# Patient Record
Sex: Female | Born: 1975 | Race: Black or African American | Hispanic: No | Marital: Single | Smoking: Never smoker
Health system: Southern US, Community
[De-identification: ages and names within clinical notes are randomized; demographics above are authoritative.]

## PROBLEM LIST (undated history)

## (undated) DIAGNOSIS — K8000 Calculus of gallbladder with acute cholecystitis without obstruction: Secondary | ICD-10-CM

## (undated) DIAGNOSIS — I10 Essential (primary) hypertension: Secondary | ICD-10-CM

## (undated) DIAGNOSIS — Z6837 Body mass index (BMI) 37.0-37.9, adult: Secondary | ICD-10-CM

## (undated) DIAGNOSIS — E119 Type 2 diabetes mellitus without complications: Secondary | ICD-10-CM

## (undated) DIAGNOSIS — J45909 Unspecified asthma, uncomplicated: Secondary | ICD-10-CM

## (undated) HISTORY — PX: OTHER SURGICAL HISTORY: SHX169

## (undated) HISTORY — PX: TUBAL LIGATION: SHX77

---
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
## 2011-04-11 ENCOUNTER — Encounter (HOSPITAL_COMMUNITY): Payer: Self-pay

## 2011-04-11 ENCOUNTER — Emergency Department (HOSPITAL_COMMUNITY)
Admission: EM | Admit: 2011-04-11 | Discharge: 2011-04-11 | Disposition: A | Discharge status: Home / Self Care | Payer: Self-pay | Attending: Emergency Medicine | Admitting: Emergency Medicine

## 2011-04-11 DIAGNOSIS — R6884 Jaw pain: Secondary | ICD-10-CM | POA: Insufficient documentation

## 2011-04-11 DIAGNOSIS — J45909 Unspecified asthma, uncomplicated: Secondary | ICD-10-CM | POA: Insufficient documentation

## 2011-04-11 DIAGNOSIS — E119 Type 2 diabetes mellitus without complications: Secondary | ICD-10-CM | POA: Insufficient documentation

## 2011-04-11 DIAGNOSIS — I10 Essential (primary) hypertension: Secondary | ICD-10-CM | POA: Insufficient documentation

## 2011-04-11 DIAGNOSIS — R220 Localized swelling, mass and lump, head: Secondary | ICD-10-CM | POA: Insufficient documentation

## 2011-04-11 DIAGNOSIS — K089 Disorder of teeth and supporting structures, unspecified: Secondary | ICD-10-CM | POA: Insufficient documentation

## 2011-04-11 HISTORY — DX: Essential (primary) hypertension: I10

## 2011-04-11 LAB — POCT I-STAT, CHEM 8
Chloride: 99 mEq/L (ref 96–112)
HCT: 50 % — ABNORMAL HIGH (ref 36.0–46.0)
Hemoglobin: 17 g/dL — ABNORMAL HIGH (ref 12.0–15.0)
Potassium: 3.4 mEq/L — ABNORMAL LOW (ref 3.5–5.1)

## 2011-04-11 LAB — URINALYSIS, ROUTINE W REFLEX MICROSCOPIC
Bilirubin Urine: NEGATIVE
Glucose, UA: >1000 mg/dL — AB
Specific Gravity, Urine: 1.035 — ABNORMAL HIGH (ref 1.005–1.030)
pH: 5.5 (ref 5.0–8.0)

## 2011-04-11 LAB — URINE MICROSCOPIC-ADD ON

## 2011-04-11 MED ORDER — LORATADINE 10 MG PO TABS: 10.0000 mg | ORAL_TABLET | Freq: Every day | ORAL | Status: AC

## 2011-04-11 MED ORDER — LISINOPRIL 20 MG PO TABS: 10.0000 mg | ORAL_TABLET | Freq: Every day | ORAL | Status: DC

## 2011-04-11 MED ORDER — METFORMIN HCL 500 MG PO TABS: 1000.0000 mg | ORAL_TABLET | Freq: Two times a day (BID) | ORAL | Status: DC

## 2011-04-11 MED ORDER — NAPROXEN 500 MG PO TABS: 500.0000 mg | ORAL_TABLET | Freq: Two times a day (BID) | ORAL | Status: DC

## 2011-04-11 MED ORDER — INSULIN ASPART 100 UNIT/ML ~~LOC~~ SOLN
8.0000 [IU] | Freq: Once | SUBCUTANEOUS | Status: AC
  Administered 2011-04-11: 8 [IU] via SUBCUTANEOUS
  Filled 2011-04-11: qty 1

## 2011-04-11 NOTE — ED Provider Notes (Signed)
History     CSN: [id number]  Arrival date & time 04/11/11  1010   First MD Initiated Contact with Patient 04/11/11 1155      Chief Complaint  Patient presents with  . Dental Pain    high blood sugar    (Consider location/radiation/quality/duration/timing/severity/associated sxs/prior treatment) Patient is a 36 y.o. female presenting with tooth pain. The history is provided by the patient. No language interpreter was used.  Dental PainThe primary symptoms include mouth pain. The symptoms began 3 to 5 days ago. The symptoms are worsening. The symptoms occur intermittently.  Additional symptoms include: jaw pain and facial swelling. Additional symptoms do not include: dental sensitivity to temperature, purulent gums, trismus and trouble swallowing.  Elevated blood sugar:    Past Medical History  Diagnosis Date  . Diabetes mellitus   . Hypertension   . Asthma     History reviewed. No pertinent past surgical history.  History reviewed. No pertinent family history.  History  Substance Use Topics  . Smoking status: Never Smoker   . Smokeless tobacco: Not on file  . Alcohol Use: No    OB History    Grav Para Term Preterm Abortions TAB SAB Ect Mult Living                  Review of Systems  HENT: Positive for facial swelling and dental problem. Negative for trouble swallowing.   Genitourinary: Positive for frequency.  All other systems reviewed and are negative.    Allergies  Penicillins  Home Medications   Current Outpatient Rx  Name Route Sig Dispense Refill  . LISINOPRIL 10 MG PO TABS Oral Take 10 mg by mouth daily.    Marland Kitchen LORATADINE 10 MG PO TABS Oral Take 10 mg by mouth daily.    Marland Kitchen METFORMIN HCL 1000 MG PO TABS Oral Take 1,000 mg by mouth 2 (two) times daily with a meal.    . MOMETASONE FUROATE 220 MCG/INH IN AEPB Inhalation Inhale 2 puffs into the lungs 2 (two) times daily.    Marland Kitchen ZOLPIDEM TARTRATE 10 MG PO TABS Oral Take 10 mg by mouth at bedtime as needed.  For sleep      BP 135/92  Pulse 99  Temp(Src) 99.3 F (37.4 C) (Oral)  Resp 16  SpO2 100%  Physical Exam  Nursing note and vitals reviewed. Constitutional: She is oriented to person, place, and time. She appears well-developed and well-nourished.  HENT:  Head: Normocephalic and atraumatic.    Mouth/Throat:    Eyes: Conjunctivae are normal. Pupils are equal, round, and reactive to light.  Neck: Normal range of motion. Neck supple.  Cardiovascular: Normal rate, regular rhythm, normal heart sounds and intact distal pulses.   Pulmonary/Chest: Effort normal and breath sounds normal.  Abdominal: Soft. Bowel sounds are normal.  Musculoskeletal: Normal range of motion.  Neurological: She is alert and oriented to person, place, and time.  Skin: Skin is warm and dry.  Psychiatric: She has a normal mood and affect. Her behavior is normal. Judgment and thought content normal.  No obvious tooth decay noted at site of pain.  Tooth in location has been previously capped.  ED Course  Procedures (including critical care time)  Labs Reviewed  GLUCOSE, CAPILLARY - Abnormal; Notable for the following:    Glucose-Capillary 466 (*)    All other components within normal limits  URINALYSIS, ROUTINE W REFLEX MICROSCOPIC   No results found.   No diagnosis found.  Patient lost medicaid eligibility  on March 1 and has not been able to see a provider for prescription refills. Prescriptions for one month refills of maintenance medications provided.  Information on free generic diabetic medications at [facility] provided.  Patient has been seen by the community care liaison with referral information provided.  Will treat jaw pain with anti-inflammatory.  MDM          Jimmye Norman, NP 04/11/11 2053

## 2011-04-11 NOTE — Progress Notes (Signed)
Paged by Charna Jakyiah Briones for medication assistance and medical resources. Per pharmacy, patient is eligible for the Z[employer] fund. I have contacted Sunny Schlein, community liaison, meet with the patient also. Awaiting scripts to fill.

## 2011-04-11 NOTE — ED Notes (Signed)
Pt complains of out of meds for dm, htn, sleep asthma and allergies since march first., sts also abscess in left side of mouth also.

## 2011-04-11 NOTE — Discharge Instructions (Signed)
Hypertension As your heart beats, it forces blood through your arteries. This force is your blood pressure. If the pressure is too high, it is called hypertension (HTN) or high blood pressure. HTN is dangerous because you may have it and not know it. High blood pressure may mean that your heart has to work harder to pump blood. Your arteries may be narrow or stiff. The extra work puts you at risk for heart disease, stroke, and other problems.  Blood pressure consists of two numbers, a higher number over a lower, 110/72, for example. It is stated as "110 over 72." The ideal is below 120 for the top number (systolic) and under 80 for the bottom (diastolic). Write down your blood pressure today. You should pay close attention to your blood pressure if you have certain conditions such as:  Heart failure.   Prior heart attack.   Diabetes   Chronic kidney disease.   Prior stroke.   Multiple risk factors for heart disease.  To see if you have HTN, your blood pressure should be measured while you are seated with your arm held at the level of the heart. It should be measured at least twice. A one-time elevated blood pressure reading (especially in the Emergency Department) does not mean that you need treatment. There may be conditions in which the blood pressure is different between your right and left arms. It is important to see your caregiver soon for a recheck. Most people have essential hypertension which means that there is not a specific cause. This type of high blood pressure may be lowered by changing lifestyle factors such as:  Stress.   Smoking.   Lack of exercise.   Excessive weight.   Drug/tobacco/alcohol use.   Eating less salt.  Most people do not have symptoms from high blood pressure until it has caused damage to the body. Effective treatment can often prevent, delay or reduce that damage. TREATMENT  When a cause has been identified, treatment for high blood pressure is  directed at the cause. There are a large number of medications to treat HTN. These fall into several categories, and your caregiver will help you select the medicines that are best for you. Medications may have side effects. You should review side effects with your caregiver. If your blood pressure stays high after you have made lifestyle changes or started on medicines,   Your medication(s) may need to be changed.   Other problems may need to be addressed.   Be certain you understand your prescriptions, and know how and when to take your medicine.   Be sure to follow up with your caregiver within the time frame advised (usually within two weeks) to have your blood pressure rechecked and to review your medications.   If you are taking more than one medicine to lower your blood pressure, make sure you know how and at what times they should be taken. Taking two medicines at the same time can result in blood pressure that is too low.  SEEK IMMEDIATE MEDICAL CARE IF:  You develop a severe headache, blurred or changing vision, or confusion.   You have unusual weakness or numbness, or a faint feeling.   You have severe chest or abdominal pain, vomiting, or breathing problems.  MAKE SURE YOU:   Understand these instructions.   Will watch your condition.   Will get help right away if you are not doing well or get worse.  Document Released: 01/10/2005 Document Revised: 12/30/2010 Document Reviewed:   08/31/2007 ExitCare Patient Information 2012 [place], [place].  Type 2 Diabetes Diabetes is a long-lasting (chronic) disease. One or both of the following happen with type 2 diabetes:   The pancreas does not make enough of a hormone called insulin.   The body has trouble using the insulin that is made.  HOME CARE  Check your blood sugar (glucose) once a day, or as told by your doctor.   Take all medicine as told by your doctor.   Do not smoke.   Eat healthy foods. Weight loss can help your  diabetes.   Learn about low blood sugar (hypoglycemia). Know how to treat it.   Get your eyes checked on a regular basis.   Get a physical exam every year. Get your blood pressure checked. Get your blood and pee (urine) tested.   Wear a necklace or bracelet that says you have diabetes.   Check your feet every night for cuts, sores, blisters, and redness. Tell your doctor if you have problems.  GET HELP RIGHT AWAY IF:  You have trouble keeping your blood sugar in target range.   You have problems with your medicines.   You are sick and not getting better after 24 hours.   You have a sore or wound that is not healing.   You have vision problems or changes.   You have a fever.  MAKE SURE YOU:  Understand these instructions.   Will watch your condition.   Will get help right away if you are not doing well or get worse.  Document Released: 10/20/2007 Document Revised: 12/30/2010 Document Reviewed: 06/28/2010 [facility] Patient Information 2012 [place], [place].  RESOURCE GUIDE  Dental Problems  Patients with Medicaid: [facility] [id number]                                           [phone] [address] Phone:  [phone]                                                  Phone:  [phone]  If unable to pay or uninsured, contact:  [facility] or [facility]. to become qualified for the adult dental clinic.  Chronic Pain Problems Contact [facility]  [phone] Patients need to be referred by their primary care doctor.  Insufficient Money for Medicine Contact [employer]:  call "211" or [facility] [phone].  No Primary Care Doctor Call [facility]  [phone] Other agencies that provide inexpensive medical care    [facility]  [phone]    [facility]  [phone]    [facility]  [phone]    [facility]  [phone]    [facility]  [phone]    [facility]  [phone]  Psychological Services [facility]  [phone] [facility] Services  [phone] [facility]   [phone] (emergency services [phone])  Substance Abuse Resources [facility]  [phone] [facility] [phone] [facility] [phone] Floydene Flock [phone] Residential & Outpatient Substance Abuse Program  [phone]  Abuse/Neglect [facility] Child Abuse Hotline (620)182-9325 [facility] Child Abuse Hotline 805-465-6517 (After Hours)  Emergency Shelter [facility] Ministries [phone]  Maternity Homes Room at the [place] [facility] [phone] [facility] [phone]  MRSA Hotline #:   (407) 760-5621    [facility] Resources  [facility]     [employer]                          [facility] Dept. [address] [place]                       [address]      [address]  Blondell Reveal Phone:  [phone]                                   Phone:  [phone]                 Phone:  [phone]  [facility] Mental Health Phone:  [phone]  [facility] Child Abuse Hotline (435) [phone] (After Hours)  Use the information provided to help locate a primary care provider.

## 2011-04-12 NOTE — ED Provider Notes (Signed)
Medical screening examination/treatment/procedure(s) were performed by non-physician practitioner and as supervising physician I was immediately available for consultation/collaboration.  Tayshaun Kroh, MD 04/12/11 1107 

## 2011-05-25 ENCOUNTER — Emergency Department (HOSPITAL_COMMUNITY): Payer: Self-pay

## 2011-05-25 ENCOUNTER — Encounter (HOSPITAL_COMMUNITY): Payer: Self-pay | Admitting: Emergency Medicine

## 2011-05-25 ENCOUNTER — Emergency Department (HOSPITAL_COMMUNITY)
Admission: EM | Admit: 2011-05-25 | Discharge: 2011-05-25 | Disposition: A | Discharge status: Home Health | Payer: Self-pay | Attending: Emergency Medicine | Admitting: Emergency Medicine

## 2011-05-25 DIAGNOSIS — S93401A Sprain of unspecified ligament of right ankle, initial encounter: Secondary | ICD-10-CM

## 2011-05-25 DIAGNOSIS — J45909 Unspecified asthma, uncomplicated: Secondary | ICD-10-CM | POA: Insufficient documentation

## 2011-05-25 DIAGNOSIS — E119 Type 2 diabetes mellitus without complications: Secondary | ICD-10-CM | POA: Insufficient documentation

## 2011-05-25 DIAGNOSIS — W108XXA Fall (on) (from) other stairs and steps, initial encounter: Secondary | ICD-10-CM | POA: Insufficient documentation

## 2011-05-25 DIAGNOSIS — Z79899 Other long term (current) drug therapy: Secondary | ICD-10-CM | POA: Insufficient documentation

## 2011-05-25 DIAGNOSIS — S93409A Sprain of unspecified ligament of unspecified ankle, initial encounter: Secondary | ICD-10-CM | POA: Insufficient documentation

## 2011-05-25 DIAGNOSIS — M25579 Pain in unspecified ankle and joints of unspecified foot: Secondary | ICD-10-CM | POA: Insufficient documentation

## 2011-05-25 DIAGNOSIS — M7989 Other specified soft tissue disorders: Secondary | ICD-10-CM | POA: Insufficient documentation

## 2011-05-25 DIAGNOSIS — W19XXXA Unspecified fall, initial encounter: Secondary | ICD-10-CM | POA: Insufficient documentation

## 2011-05-25 DIAGNOSIS — I10 Essential (primary) hypertension: Secondary | ICD-10-CM | POA: Insufficient documentation

## 2011-05-25 DIAGNOSIS — M79609 Pain in unspecified limb: Secondary | ICD-10-CM | POA: Insufficient documentation

## 2011-05-25 MED ORDER — HYDROCODONE-ACETAMINOPHEN 5-325 MG PO TABS: 1.0000 | ORAL_TABLET | Freq: Four times a day (QID) | ORAL | Status: AC | PRN

## 2011-05-25 NOTE — ED Notes (Signed)
Patient transported to X-ray 

## 2011-05-25 NOTE — ED Provider Notes (Signed)
Medical screening examination/treatment/procedure(s) were performed by non-physician practitioner and as supervising physician I was immediately available for consultation/collaboration.   Celene Kras, MD 05/25/11 [phone]

## 2011-05-25 NOTE — ED Notes (Signed)
Pt co right ankle pain, pt reports walking outside to car and fell down two steps to the ground last night around 10-11 p.m., pt thinks she twisted ankle. Pt states left ankle was much more swollen than it is now but she elevated and put ice on it, pt states she is unable to put weight on left ankle.

## 2011-05-25 NOTE — ED Provider Notes (Signed)
History     CSN: [id number]  Arrival date & time 05/25/11  0830   First MD Initiated Contact with Patient 05/25/11 0838      8:48 AM HPI Patient reports last eye she tripped on first steps and twisted her right ankle. Reports she was unable to come last night she could not find childcare. States during the night she has used ice, elevation, rest and there has been no improvement in pain. Reports waking up this morning having significant swelling in right ankle  Patient is a 36 y.o. female presenting with ankle pain.  Ankle Pain  The incident occurred yesterday. The incident occurred at home. The injury mechanism was a fall and torsion. The pain is present in the right ankle and right foot. The pain is moderate. The pain has been constant since onset. Associated symptoms include inability to bear weight. Pertinent negatives include no numbness, no loss of motion, no muscle weakness, no loss of sensation and no tingling. She reports no foreign bodies present. The symptoms are aggravated by activity, bearing weight and palpation. She has tried NSAIDs, ice, rest and elevation for the symptoms. The treatment provided no relief.    Past Medical History  Diagnosis Date  . Diabetes mellitus   . Hypertension   . Asthma     History reviewed. No pertinent past surgical history.  No family history on file.  History  Substance Use Topics  . Smoking status: Never Smoker   . Smokeless tobacco: Not on file  . Alcohol Use: No    OB History    Grav Para Term Preterm Abortions TAB SAB Ect Mult Living                  Review of Systems  Musculoskeletal: Positive for joint swelling.       Ankle and foot pain  Neurological: Negative for tingling, weakness and numbness.  All other systems reviewed and are negative.    Allergies  Penicillins  Home Medications   Current Outpatient Rx  Name Route Sig Dispense Refill  . LISINOPRIL 10 MG PO TABS Oral Take 10 mg by mouth daily.    Marland Kitchen  LISINOPRIL 20 MG PO TABS Oral Take 0.5 tablets (10 mg total) by mouth daily. 30 tablet 0  . LORATADINE 10 MG PO TABS Oral Take 10 mg by mouth daily.    Marland Kitchen LORATADINE 10 MG PO TABS Oral Take 1 tablet (10 mg total) by mouth daily. 30 tablet 0  . METFORMIN HCL 1000 MG PO TABS Oral Take 1,000 mg by mouth 2 (two) times daily with a meal.    . METFORMIN HCL 500 MG PO TABS Oral Take 2 tablets (1,000 mg total) by mouth 2 (two) times daily with a meal. 60 tablet 0  . MOMETASONE FUROATE 220 MCG/INH IN AEPB Inhalation Inhale 2 puffs into the lungs 2 (two) times daily.    Marland Kitchen NAPROXEN 500 MG PO TABS Oral Take 1 tablet (500 mg total) by mouth 2 (two) times daily. 30 tablet 0  . ZOLPIDEM TARTRATE 10 MG PO TABS Oral Take 10 mg by mouth at bedtime as needed. For sleep      BP 110/73  Pulse 87  Temp(Src) 98.4 F (36.9 C) (Oral)  Resp 18  SpO2 100%  Physical Exam  Vitals reviewed. Constitutional: She is oriented to person, place, and time. Vital signs are normal. She appears well-developed and well-nourished. No distress.  HENT:  Head: Normocephalic and atraumatic.  Eyes: Pupils  are equal, round, and reactive to light.  Neck: Neck supple.  Pulmonary/Chest: Effort normal.  Musculoskeletal:       Right foot: She exhibits decreased range of motion, tenderness, bony tenderness and swelling. She exhibits normal capillary refill, no crepitus, no deformity and no laceration.       Feet:  Neurological: She is alert and oriented to person, place, and time.  Skin: Skin is warm and dry. No rash noted. No erythema. No pallor.  Psychiatric: She has a normal mood and affect. Her behavior is normal.    ED Course  Procedures  Results for orders placed during the hospital encounter of 04/11/11  GLUCOSE, CAPILLARY      Component Value Range   Glucose-Capillary 466 (*) 70 - 99 (mg/dL)  URINALYSIS, ROUTINE W REFLEX MICROSCOPIC      Component Value Range   Color, Urine YELLOW  YELLOW    APPearance CLEAR  CLEAR     Specific Gravity, Urine 1.035 (*) 1.005 - 1.030    pH 5.5  5.0 - 8.0    Glucose, UA >1000 (*) NEGATIVE (mg/dL)   Hgb urine dipstick TRACE (*) NEGATIVE    Bilirubin Urine NEGATIVE  NEGATIVE    Ketones, ur NEGATIVE  NEGATIVE (mg/dL)   Protein, ur NEGATIVE  NEGATIVE (mg/dL)   Urobilinogen, UA 0.2  0.0 - 1.0 (mg/dL)   Nitrite NEGATIVE  NEGATIVE    Leukocytes, UA NEGATIVE  NEGATIVE   URINE MICROSCOPIC-ADD ON      Component Value Range   Squamous Epithelial / LPF FEW (*) RARE    WBC, UA 0-2  <3 (WBC/hpf)   RBC / HPF 0-2  <3 (RBC/hpf)  POCT I-STAT, CHEM 8      Component Value Range   Sodium 137  135 - 145 (mEq/L)   Potassium 3.4 (*) 3.5 - 5.1 (mEq/L)   Chloride 99  96 - 112 (mEq/L)   BUN 4 (*) 6 - 23 (mg/dL)   Creatinine, Ser 1.61  0.50 - 1.10 (mg/dL)   Glucose, Bld 096 (*) 70 - 99 (mg/dL)   Calcium, Ion 0.45  4.09 - 1.32 (mmol/L)   TCO2 25  0 - 100 (mmol/L)   Hemoglobin 17.0 (*) 12.0 - 15.0 (g/dL)   HCT 81.1 (*) 91.4 - 46.0 (%)   Dg Ankle Complete Right  05/25/2011  *RADIOLOGY REPORT*  Clinical Data: Larey Seat down steps, pain and swelling at lateral aspect of the foot and ankle  RIGHT ANKLE - COMPLETE 3+ VIEW  Comparison: None  Findings: Ankle mortise intact. Osseous mineralization grossly normal. Joint spaces preserved. Soft tissue swelling, greatest anteriorly. No acute fracture, dislocation, or bone destruction.  IMPRESSION: No acute osseous abnormalities.  Original Report Authenticated By: Lollie Marrow, M.D.   Dg Foot Complete Right  05/25/2011  *RADIOLOGY REPORT*  Clinical Data: Larey Seat down stairs.  Pain and swelling to lateral foot and ankle.  RIGHT FOOT COMPLETE - 3+ VIEW  Comparison: None.  Findings: There is no evidence for an acute fracture.  Bony alignment is anatomic. No worrisome lytic or sclerotic osseous lesion.  IMPRESSION: No evidence for acute fracture.  No subluxation or dislocation.  Original Report Authenticated By: ERIC A. MANSELL, M.D.     MDM    Patient's x-rays  negative for fracture. Placed in ASO and crutches. Advised likely has a sprain. Advised followup with orthopedic physician if no improvement in one week. Patient provided referral for Dr. Dion Saucier.      Thomasene Lot, PA-C 05/25/11 [phone]

## 2011-05-25 NOTE — Discharge Instructions (Signed)
Ankle Sprain An ankle sprain is an injury to the strong, fibrous tissues (ligaments) that hold the bones of your ankle joint together.  CAUSES Ankle sprain usually is caused by a fall or by twisting your ankle. People who participate in sports are more prone to these types of injuries.  SYMPTOMS  Symptoms of ankle sprain include:  Pain in your ankle. The pain may be present at rest or only when you are trying to stand or walk.   Swelling.   Bruising. Bruising may develop immediately or within 1 to 2 days after your injury.   Difficulty standing or walking.  DIAGNOSIS  Your caregiver will ask you details about your injury and perform a physical exam of your ankle to determine if you have an ankle sprain. During the physical exam, your caregiver will press and squeeze specific areas of your foot and ankle. Your caregiver will try to move your ankle in certain ways. An X-ray exam may be done to be sure a bone was not broken or a ligament did not separate from one of the bones in your ankle (avulsion).  TREATMENT  Certain types of braces can help stabilize your ankle. Your caregiver can make a recommendation for this. Your caregiver may recommend the use of medication for pain. If your sprain is severe, your caregiver may refer you to a surgeon who helps to restore function to parts of your skeletal system (orthopedist) or a physical therapist. HOME CARE INSTRUCTIONS  Apply ice to your injury for 1 to 2 days or as directed by your caregiver. Applying ice helps to reduce inflammation and pain.  Put ice in a plastic bag.   Place a towel between your skin and the bag.   Leave the ice on for 15 to 20 minutes at a time, every 2 hours while you are awake.   Take over-the-counter or prescription medicines for pain, discomfort, or fever only as directed by your caregiver.   Keep your injured leg elevated, when possible, to lessen swelling.   If your caregiver recommends crutches, use them as  instructed. Gradually, put weight on the affected ankle. Continue to use crutches or a cane until you can walk without feeling pain in your ankle.   If you have a plaster splint, wear the splint as directed by your caregiver. Do not rest it on anything harder than a pillow the first 24 hours. Do not put weight on it. Do not get it wet. You may take it off to take a shower or bath.   You may have been given an elastic bandage to wear around your ankle to provide support. If the elastic bandage is too tight (you have numbness or tingling in your foot or your foot becomes cold and blue), adjust the bandage to make it comfortable.   If you have an air splint, you may blow more air into it or let air out to make it more comfortable. You may take your splint off at night and before taking a shower or bath.   Wiggle your toes in the splint several times per day if you are able.  SEEK MEDICAL CARE IF:   You have an increase in bruising, swelling, or pain.   Your toes feel cold.   Pain relief is not achieved with medication.  SEEK IMMEDIATE MEDICAL CARE IF: Your toes are numb or blue or you have severe pain. MAKE SURE YOU:   Understand these instructions.   Will watch your condition.     Will get help right away if you are not doing well or get worse.  Document Released: 01/10/2005 Document Revised: 12/30/2010 Document Reviewed: 08/15/2007 [employer] Patient Information 2012 [employer]. 

## 2011-07-31 ENCOUNTER — Emergency Department (HOSPITAL_COMMUNITY)
Admission: EM | Admit: 2011-07-31 | Discharge: 2011-07-31 | Disposition: A | Discharge status: Home / Self Care | Payer: Self-pay | Attending: Emergency Medicine | Admitting: Emergency Medicine

## 2011-07-31 DIAGNOSIS — I10 Essential (primary) hypertension: Secondary | ICD-10-CM | POA: Insufficient documentation

## 2011-07-31 DIAGNOSIS — K089 Disorder of teeth and supporting structures, unspecified: Secondary | ICD-10-CM | POA: Insufficient documentation

## 2011-07-31 DIAGNOSIS — Z880 Allergy status to penicillin: Secondary | ICD-10-CM | POA: Insufficient documentation

## 2011-07-31 DIAGNOSIS — K0889 Other specified disorders of teeth and supporting structures: Secondary | ICD-10-CM

## 2011-07-31 DIAGNOSIS — J45909 Unspecified asthma, uncomplicated: Secondary | ICD-10-CM | POA: Insufficient documentation

## 2011-07-31 DIAGNOSIS — E119 Type 2 diabetes mellitus without complications: Secondary | ICD-10-CM | POA: Insufficient documentation

## 2011-07-31 MED ORDER — CLINDAMYCIN HCL 150 MG PO CAPS: 450.0000 mg | ORAL_CAPSULE | Freq: Three times a day (TID) | ORAL | Status: AC

## 2011-07-31 MED ORDER — OXYCODONE-ACETAMINOPHEN 5-325 MG PO TABS: 1.0000 | ORAL_TABLET | Freq: Four times a day (QID) | ORAL | Status: AC | PRN

## 2011-07-31 NOTE — ED Notes (Signed)
Pt. With facial swelling on left side which started yesterday. Pt reports gum pain on lower left side and feels it is coming from a tooth in that area.  Rates pain as a 10.

## 2011-07-31 NOTE — ED Provider Notes (Signed)
History     CSN: [id number]  Arrival date & time 07/31/11  1005   First MD Initiated Contact with Patient 07/31/11 1010      Chief Complaint  Patient presents with  . Dental Pain    (Consider location/radiation/quality/duration/timing/severity/associated sxs/prior treatment) HPI Comments: Patient presenting with dental pain of the left lower molar that has been present since yesterday.  She does not have a dentist.  No acute dental injury.  Patient is a 36 y.o. female presenting with tooth pain. The history is provided by the patient.  Dental PainThe primary symptoms include mouth pain. Primary symptoms do not include dental injury, oral bleeding, fever or sore throat. The symptoms began yesterday. The symptoms are worsening. The symptoms are new. The symptoms occur constantly.  Additional symptoms include: gum swelling, gum tenderness and facial swelling. Additional symptoms do not include: purulent gums, trismus, trouble swallowing, pain with swallowing, excessive salivation, drooling and ear pain.    Past Medical History  Diagnosis Date  . Diabetes mellitus   . Hypertension   . Asthma     No past surgical history on file.  No family history on file.  History  Substance Use Topics  . Smoking status: Never Smoker   . Smokeless tobacco: Not on file  . Alcohol Use: No    OB History    Grav Para Term Preterm Abortions TAB SAB Ect Mult Living                  Review of Systems  Constitutional: Negative for fever and chills.  HENT: Positive for facial swelling and dental problem. Negative for ear pain, sore throat, drooling, trouble swallowing, neck pain and neck stiffness.   Gastrointestinal: Negative for nausea and vomiting.  Skin: Negative for color change.    Allergies  Penicillins  Home Medications   Current Outpatient Rx  Name Route Sig Dispense Refill  . METFORMIN HCL 1000 MG PO TABS Oral Take 1,000 mg by mouth 2 (two) times daily with a meal.    .  MOMETASONE FUROATE 220 MCG/INH IN AEPB Inhalation Inhale 2 puffs into the lungs 2 (two) times daily.    Marland Kitchen LISINOPRIL 10 MG PO TABS Oral Take 10 mg by mouth daily.    Marland Kitchen LORATADINE 10 MG PO TABS Oral Take 1 tablet (10 mg total) by mouth daily. 30 tablet 0    BP 124/81  Pulse 64  Temp 98.7 F (37.1 C) (Oral)  Resp 20  SpO2 100%  Physical Exam  Nursing note and vitals reviewed. Constitutional: She is oriented to person, place, and time. She appears well-developed and well-nourished. No distress.  HENT:  Head: Normocephalic and atraumatic. No trismus in the jaw.  Mouth/Throat: Uvula is midline, oropharynx is clear and moist and mucous membranes are normal. Abnormal dentition. No dental abscesses or uvula swelling. No oropharyngeal exudate, posterior oropharyngeal edema, posterior oropharyngeal erythema or tonsillar abscesses.       Poor dental hygiene. Pt able to open and close mouth with out difficulty. Airway intact. Uvula midline. Mild gingival swelling with tenderness over affected area, but no fluctuance. No swelling or tenderness of submental and submandibular regions.  Eyes: Conjunctivae and EOM are normal.  Neck: Normal range of motion and full passive range of motion without pain. Neck supple.  Cardiovascular: Normal rate, regular rhythm and normal heart sounds.   Pulmonary/Chest: Effort normal and breath sounds normal. No stridor. No respiratory distress. She has no wheezes.  Musculoskeletal: Normal range of motion.  Lymphadenopathy:       Head (right side): No submental, no submandibular, no tonsillar, no preauricular and no posterior auricular adenopathy present.       Head (left side): No submental, no submandibular, no tonsillar, no preauricular and no posterior auricular adenopathy present.    She has no cervical adenopathy.  Neurological: She is alert and oriented to person, place, and time.  Skin: Skin is warm and dry. No rash noted. She is not diaphoretic.  Psychiatric: She  has a normal mood and affect.    ED Course  Procedures (including critical care time)  Labs Reviewed - No data to display No results found.   No diagnosis found.    MDM  Patient with toothache.  No gross abscess.  Exam unconcerning for Ludwig's angina or spread of infection.  Will treat with antibiotic and pain medicine.  Urged patient to follow-up with dentist.          Pascal Lux [place], PA-C 07/31/11 2025

## 2011-08-01 NOTE — ED Provider Notes (Signed)
Medical screening examination/treatment/procedure(s) were performed by non-physician practitioner and as supervising physician I was immediately available for consultation/collaboration.  Martha K Linker, MD 08/01/11 1510 

## 2012-04-23 ENCOUNTER — Other Ambulatory Visit (HOSPITAL_COMMUNITY): Payer: Self-pay | Admitting: Nurse Practitioner

## 2012-04-23 DIAGNOSIS — R109 Unspecified abdominal pain: Secondary | ICD-10-CM

## 2012-04-24 ENCOUNTER — Observation Stay (HOSPITAL_COMMUNITY)
Admission: AD | Admit: 2012-04-24 | Discharge: 2012-04-26 | Disposition: A | Discharge status: Home / Self Care | Payer: Medicaid Other | Source: Ambulatory Visit | Attending: General Surgery | Admitting: General Surgery

## 2012-04-24 ENCOUNTER — Encounter (HOSPITAL_COMMUNITY): Payer: Self-pay

## 2012-04-24 VITALS — BP 99/63 | HR 68 | Temp 98.6°F | Resp 18 | Ht 65.0 in | Wt 226.2 lb

## 2012-04-24 DIAGNOSIS — R109 Unspecified abdominal pain: Secondary | ICD-10-CM

## 2012-04-24 DIAGNOSIS — E119 Type 2 diabetes mellitus without complications: Secondary | ICD-10-CM | POA: Insufficient documentation

## 2012-04-24 DIAGNOSIS — K8010 Calculus of gallbladder with chronic cholecystitis without obstruction: Secondary | ICD-10-CM

## 2012-04-24 DIAGNOSIS — J45909 Unspecified asthma, uncomplicated: Secondary | ICD-10-CM | POA: Insufficient documentation

## 2012-04-24 DIAGNOSIS — K8000 Calculus of gallbladder with acute cholecystitis without obstruction: Principal | ICD-10-CM | POA: Insufficient documentation

## 2012-04-24 DIAGNOSIS — K7689 Other specified diseases of liver: Secondary | ICD-10-CM | POA: Insufficient documentation

## 2012-04-24 DIAGNOSIS — Z6837 Body mass index (BMI) 37.0-37.9, adult: Secondary | ICD-10-CM

## 2012-04-24 DIAGNOSIS — I10 Essential (primary) hypertension: Secondary | ICD-10-CM | POA: Insufficient documentation

## 2012-04-24 HISTORY — DX: Type 2 diabetes mellitus without complications: E11.9

## 2012-04-24 HISTORY — DX: Essential (primary) hypertension: I10

## 2012-04-24 HISTORY — DX: Unspecified asthma, uncomplicated: J45.909

## 2012-04-24 HISTORY — DX: Body mass index (bmi) 37.0-37.9, adult: Z68.37

## 2012-04-24 HISTORY — DX: Calculus of gallbladder with acute cholecystitis without obstruction: K80.00

## 2012-04-24 LAB — URINALYSIS, ROUTINE W REFLEX MICROSCOPIC
Leukocytes, UA: NEGATIVE
Nitrite: NEGATIVE
Protein, ur: NEGATIVE mg/dL
Specific Gravity, Urine: 1.024 (ref 1.005–1.030)
Urobilinogen, UA: 0.2 mg/dL (ref 0.0–1.0)

## 2012-04-24 LAB — BASIC METABOLIC PANEL
Calcium: 9 mg/dL (ref 8.4–10.5)
GFR calc Af Amer: >90 mL/min (ref >90)
GFR calc non Af Amer: >90 mL/min (ref >90)
Glucose, Bld: 132 mg/dL — ABNORMAL HIGH (ref 70–99)
Potassium: 3.8 mEq/L (ref 3.5–5.1)
Sodium: 135 mEq/L (ref 135–145)

## 2012-04-24 LAB — URINE MICROSCOPIC-ADD ON

## 2012-04-24 LAB — SURGICAL PCR SCREEN
MRSA, PCR: NEGATIVE
Staphylococcus aureus: NEGATIVE

## 2012-04-24 LAB — MAGNESIUM: Magnesium: 1.6 mg/dL (ref 1.5–2.5)

## 2012-04-24 MED ORDER — POTASSIUM CHLORIDE IN NACL 20-0.9 MEQ/L-% IV SOLN
INTRAVENOUS | Status: AC
  Administered 2012-04-24: 16:00:00 via INTRAVENOUS
  Filled 2012-04-24: qty 1000

## 2012-04-24 MED ORDER — ACETAMINOPHEN 650 MG RE SUPP: 650.0000 mg | Freq: Four times a day (QID) | RECTAL | Status: DC | PRN

## 2012-04-24 MED ORDER — ONDANSETRON HCL 4 MG/2ML IJ SOLN
4.0000 mg | Freq: Four times a day (QID) | INTRAMUSCULAR | Status: DC | PRN
  Administered 2012-04-24: 4 mg via INTRAVENOUS
  Filled 2012-04-24: qty 2

## 2012-04-24 MED ORDER — OXYCODONE HCL 5 MG PO TABS: 5.0000 mg | ORAL_TABLET | ORAL | Status: DC | PRN

## 2012-04-24 MED ORDER — POTASSIUM CHLORIDE IN NACL 20-0.9 MEQ/L-% IV SOLN
INTRAVENOUS | Status: DC
  Administered 2012-04-25 (×2): via INTRAVENOUS
  Filled 2012-04-24 (×3): qty 1000

## 2012-04-24 MED ORDER — ACETAMINOPHEN 325 MG PO TABS: 650.0000 mg | ORAL_TABLET | Freq: Four times a day (QID) | ORAL | Status: DC | PRN

## 2012-04-24 MED ORDER — HEPARIN SODIUM (PORCINE) 5000 UNIT/ML IJ SOLN
5000.0000 [IU] | Freq: Three times a day (TID) | INTRAMUSCULAR | Status: AC
  Administered 2012-04-24 (×2): 5000 [IU] via SUBCUTANEOUS
  Filled 2012-04-24 (×2): qty 1

## 2012-04-24 MED ORDER — CIPROFLOXACIN IN D5W 400 MG/200ML IV SOLN
400.0000 mg | Freq: Two times a day (BID) | INTRAVENOUS | Status: DC
  Administered 2012-04-24 – 2012-04-26 (×4): 400 mg via INTRAVENOUS
  Filled 2012-04-24 (×5): qty 200

## 2012-04-24 MED ORDER — HYDROMORPHONE HCL PF 1 MG/ML IJ SOLN
0.5000 mg | INTRAMUSCULAR | Status: DC | PRN
  Administered 2012-04-24 (×2): 0.5 mg via INTRAVENOUS
  Administered 2012-04-25: 1 mg via INTRAVENOUS
  Administered 2012-04-25: 0.5 mg via INTRAVENOUS
  Filled 2012-04-24 (×4): qty 1

## 2012-04-24 NOTE — Anesthesia Preprocedure Evaluation (Addendum)
Anesthesia Evaluation  Patient identified by MRN, date of birth, ID band Patient awake    Reviewed: Allergy & Precautions, H&P , NPO status , Patient's Chart, lab work & pertinent test results, reviewed documented beta blocker date and time   Airway Mallampati: II TM Distance: >3 FB Neck ROM: full    Dental no notable dental hx. (+) Teeth Intact and Dental Advisory Given   Pulmonary asthma ,  breath sounds clear to auscultation  Pulmonary exam normal       Cardiovascular Exercise Tolerance: Good hypertension, Pt. on medications Rhythm:regular Rate:Normal     Neuro/Psych negative neurological ROS  negative psych ROS   GI/Hepatic negative GI ROS, Neg liver ROS,   Endo/Other  diabetes, Well Controlled, Type 2, Oral Hypoglycemic Agents  Renal/GU negative Renal ROS  negative genitourinary   Musculoskeletal   Abdominal   Peds  Hematology negative hematology ROS (+)   Anesthesia Other Findings   Reproductive/Obstetrics negative OB ROS                           Anesthesia Physical Anesthesia Plan  ASA: III  Anesthesia Plan: General   Post-op Pain Management:    Induction: Intravenous  Airway Management Planned: Oral ETT  Additional Equipment:   Intra-op Plan:   Post-operative Plan: Extubation in OR  Informed Consent: I have reviewed the patients History and Physical, chart, labs and discussed the procedure including the risks, benefits and alternatives for the proposed anesthesia with the patient or authorized representative who has indicated his/her understanding and acceptance.   Dental Advisory Given  Plan Discussed with: CRNA and Surgeon  Anesthesia Plan Comments:         Anesthesia Quick Evaluation

## 2012-04-24 NOTE — H&P (Signed)
April Byrd is an 37 y.o. female.   PCP Dr. Clarice Pole NP Chief Complaint: Nausea, abdominal pain and back pain.  HPI: Patient is a 37 year old female presented to Dr. Jone Baseman office with epigastric pain started on 04/20/12. She developed abdominal pain 04/21/12, which also went to her back became progressively worse over the next 3 days. She presented to Dr. Valentina Lucks yesterday with abdominal pain, mid abdomen going toward the back.   She has no prior history of this, type of pain; she's been able to eat and drink some with this. She would have nausea but no vomiting until this a.m. She vomited after eating hash browns. Labs yesterday showed a white count of 6.8, hemoglobin of 13, hematocrit of 40.8, platelets were 229,000. Urinalysis was positive for blood LFTs were normal. She underwent abdominal ultrasound this morning which showed gallstones measuring up to 2.8 cm and 1.8 cm lodged in the neck of the gallbladder on ultrasound this AM. Gallbladder wall thickening measuring up to 3.6 mm. Patient was then  reported to be non- tender pm Ultrasound exam. Common bile duct was 4.2 mm liver was heterogeneous with a suggestion of fatty infiltration. She was referred to Dr. Johna Sheriff by Dr. Valentina Lucks. By mutual agreement she was transferred to [facility] for elective admission. Past Medical History  Diagnosis Date  . Diabetes mellitus   . Hypertension   . Asthma    Prior to Admission medications   Medication Sig Start Date End Date Taking? Authorizing Provider  glimepiride (AMARYL) 2 MG tablet Take 2 mg by mouth daily before breakfast.   Yes Historical Provider, MD  metFORMIN (GLUCOPHAGE) 1000 MG tablet Take 1,000 mg by mouth 2 (two) times daily with a meal.   Yes Historical Provider, MD  traMADol-acetaminophen (ULTRACET) 37.5-325 MG per tablet Take 1-2 tablets by mouth every 6 (six) hours as needed for pain.   Yes Historical Provider, MD  mometasone (ASMANEX 14 METERED DOSES) 220  MCG/INH inhaler Inhale 2 puffs into the lungs 2 (two) times daily.    Historical Provider, MD     No past surgical history on file.3 Csections Hx of hydrocephalus shunt, removed at age 62 BLT 61  Family history: Father cc of liver disease and diagnosis with coronary disease. Mother is living with diabetes and hypertension one brother deceased. 3 children ages 28,11, and 7.  Social History:  She is single she has 3 children she works patient care for disabled individuals EtOH: rare. Tobacco: None.  Drugs: None   Allergies: Penicillin  This occurred during her childhood she does not know what the reaction was.  Allergies  Allergen Reactions  . Penicillins Other (See Comments)    unknown    Medications Prior to Admission  Medication Sig Dispense Refill  . lisinopril (PRINIVIL,ZESTRIL) 10 MG tablet Take 10 mg by mouth daily.      . metFORMIN (GLUCOPHAGE) 1000 MG tablet Take 1,000 mg by mouth 2 (two) times daily with a meal.      . mometasone (ASMANEX 14 METERED DOSES) 220 MCG/INH inhaler Inhale 2 puffs into the lungs 2 (two) times daily.        No results found for this or any previous visit (from the past 48 hour(s)). US Abdomen Complete  04/24/2012  *RADIOLOGY REPORT*  Clinical Data:  Diabetic hypertensive patient with abdominal pain.  COMPLETE ABDOMINAL ULTRASOUND  Comparison:  None.  Findings:  Gallbladder:  Gallstones measuring gallstone measuring up to 2.8 cm.  1.8 cm gallstone lodged  in the gallbladder neck.  Gallbladder wall thickening measuring 3.6 mm.  As per ultrasound technologist, the patient was not tender over this region during scanning.  Common bile duct:  4.2 mm.  Distal aspect not well delineated secondary to bowel gas.  Liver:  Heterogeneous increased echogenicity suggestive of fatty infiltration without dominant mass.   Right lobe appears elongated.  IVC:  Appears normal.  Pancreas:  Pancreatic tail not visualized secondary to bowel gas. Otherwise unremarkable  Spleen:   6.5 cm without dominant mass  Right Kidney:  11.3 cm. No hydronephrosis or renal mass.  Left Kidney:  11.5 cm. No hydronephrosis or renal mass.  Abdominal aorta:  No aneurysm identified.  IMPRESSION: Gallstones measuring gallstone measuring up to 2.8 cm.  1.8 cm gallstone lodged in the gallbladder neck.  Gallbladder wall thickening measuring 3.6 mm.  As per ultrasound technologist, the patient was not tender over this region during scanning.  Clinical and laboratory correlation recommended.  Fatty liver.  Pancreatic tail not well delineated secondary to overlying bowel gas.  This is a call report.   Original Report Authenticated By: Lacy Duverney, M.D.     Review of Systems  Constitutional: Negative.   HENT: Negative.   Eyes: Negative.   Respiratory: Positive for shortness of breath (some doe).   Cardiovascular: Negative.   Gastrointestinal: Positive for nausea, vomiting (this am after hashbrowns.) and abdominal pain. Negative for heartburn, diarrhea, constipation, blood in stool and melena.  Genitourinary: Negative.   Musculoskeletal: Negative.   Skin: Negative.   Neurological: Negative.   Endo/Heme/Allergies: Negative.   Psychiatric/Behavioral: Negative.     Blood pressure 110/73, pulse 63, temperature 99.1 F (37.3 C), temperature source Oral, resp. rate 16, SpO2 100.00%. Physical Exam  Constitutional: She is oriented to person, place, and time. She appears well-developed and well-nourished.  64 inch  228 lb BMI 39  HENT:  Head: Normocephalic and atraumatic.  Nose: Nose normal.  Eyes: Conjunctivae and EOM are normal. Pupils are equal, round, and reactive to light. Right eye exhibits no discharge. No scleral icterus.  Neck: Normal range of motion. Neck supple. No JVD present. No tracheal deviation present. No thyromegaly present.  Cardiovascular: Normal rate, regular rhythm, normal heart sounds and intact distal pulses.  Exam reveals no gallop.   No murmur heard. Respiratory: Effort  normal and breath sounds normal. No respiratory distress. She has no wheezes. She has no rales. She exhibits no tenderness.  GI: Soft. Bowel sounds are normal. She exhibits no distension and no mass. There is tenderness (RUQ primary site and most tender, some RLQ and midepigastric). There is no rebound and no guarding.  Musculoskeletal: Normal range of motion. She exhibits no edema.  Lymphadenopathy:    She has no cervical adenopathy.  Neurological: She is alert and oriented to person, place, and time. No cranial nerve deficit.  Skin: Skin is warm and dry. No rash noted. No erythema.  Psychiatric: She has a normal mood and affect. Her behavior is normal. Judgment and thought content normal.     Assessment/Plan 1. Cholelithiasis with cholecystitis. 2. Type 2 diabetes non-insulin-dependent, oral meds. 3. Athletic asthma no problems currently 4. Obesity with a BMI of 39.  Plan: CBC and LFTs were normal yesterday, check a BMP today. Start clears and antibiotics today and tentatively schedule for surgery the a.m. her last by mouth intake was this morning with postprandial nausea and some vomiting. [facility] physician assistant for Dr. Glenna Fellows.  Erza Mothershead 04/24/2012, 2:15 PM

## 2012-04-24 NOTE — Progress Notes (Signed)
Nutrition Education Note  RD education requested for nutrition education regarding diabetes, weight management, and low fat diet for cholecystectomy.   No results found for this basename: HGBA1C    RD provided "Carbohydrate Counting for People with Diabetes" handout from the Academy of [employer] and [employer]etics. Discussed different food groups and their effects on blood sugar, emphasizing carbohydrate-containing foods. Provided list of carbohydrates and recommended serving sizes of common foods.  Discussed importance of controlled and consistent carbohydrate intake throughout the day. Provided examples of ways to balance meals/snacks and encouraged intake of high-fiber, whole grain complex carbohydrates. Teach back method used.  RD provided "Weight management cooking tips" handout from the Academy of [employer] and [employer]etics. Discussed importance of portion control, avoiding sugary beverages, and choosing nutritious foods.   RD also briefly discussed low fat diet for cholecystectomy. Discussed sources of fat in the diet and recommended alternatives.   Pt states biggest barrier is cooking for the family and feeling rushed and busy. Pt admitted to eating out frequently, snacking often, and drinking sweet tea. Discussed healthier meals/snacks on the go as well as better beverage choices.   Expect fair compliance. Pt interested in outpatient RD DM follow up, will order.   Body mass index is 37.64 kg/(m^2). Pt meets criteria for class II obesity based on current BMI.  Pt NPO for surgery tomorrow. Labs and medications reviewed. No further nutrition interventions warranted at this time. RD contact information provided. If additional nutrition issues arise, please re-consult RD.   Levon Hedger MS, RD, LDN [phone] Pager [phone] After Hours Pager

## 2012-04-24 NOTE — H&P (Signed)
Patient interviewed and examined, Imaging and lab reviewed, agree with PA note above. She has persistent discomfort and thickening of the gallbladder wall consistent with mild acute cholecystitis. She has eaten today and we will plan laparoscopic cholecystectomy with cholangiogram tomorrow morning.I discussed the procedure in detail.   We discussed the risks and benefits of a laparoscopic cholecystectomy and possible cholangiogram including, but not limited to bleeding, infection, injury to surrounding structures such as the intestine or liver, bile leak, retained gallstones, need to convert to an open procedure, prolonged diarrhea, blood clots such as  DVT, common bile duct injury, anesthesia risks, and possible need for additional procedures.  The likelihood of improvement in symptoms and return to the patient's normal status is good. We discussed the typical post-operative recovery course.  Mariella Saa MD, FACS  04/24/2012 3:39 PM

## 2012-04-25 ENCOUNTER — Encounter (HOSPITAL_COMMUNITY): Admission: AD | Disposition: A | Discharge status: Home / Self Care | Payer: Self-pay | Source: Ambulatory Visit

## 2012-04-25 ENCOUNTER — Encounter (HOSPITAL_COMMUNITY): Payer: Self-pay | Admitting: Anesthesiology

## 2012-04-25 ENCOUNTER — Observation Stay (HOSPITAL_COMMUNITY): Payer: Medicaid Other | Admitting: Anesthesiology

## 2012-04-25 ENCOUNTER — Observation Stay (HOSPITAL_COMMUNITY): Payer: Medicaid Other

## 2012-04-25 HISTORY — PX: CHOLECYSTECTOMY: SHX55

## 2012-04-25 LAB — GLUCOSE, CAPILLARY: Glucose-Capillary: 140 mg/dL — ABNORMAL HIGH (ref 70–99)

## 2012-04-25 LAB — CBC
HCT: 34.2 % — ABNORMAL LOW (ref 36.0–46.0)
Platelets: 191 10*3/uL (ref 150–400)
RBC: 4.34 MIL/uL (ref 3.87–5.11)
RDW: 14.4 % (ref 11.5–15.5)
WBC: 5 10*3/uL (ref 4.0–10.5)

## 2012-04-25 LAB — COMPREHENSIVE METABOLIC PANEL
Alkaline Phosphatase: 74 U/L (ref 39–117)
BUN: 7 mg/dL (ref 6–23)
Creatinine, Ser: 0.82 mg/dL (ref 0.50–1.10)
GFR calc Af Amer: >90 mL/min (ref >90)
Glucose, Bld: 147 mg/dL — ABNORMAL HIGH (ref 70–99)
Potassium: 3.6 mEq/L (ref 3.5–5.1)
Total Bilirubin: 0.9 mg/dL (ref 0.3–1.2)
Total Protein: 6.6 g/dL (ref 6.0–8.3)

## 2012-04-25 SURGERY — LAPAROSCOPIC CHOLECYSTECTOMY WITH INTRAOPERATIVE CHOLANGIOGRAM
Anesthesia: General | Wound class: Contaminated

## 2012-04-25 MED ORDER — HEPARIN SODIUM (PORCINE) 5000 UNIT/ML IJ SOLN
5000.0000 [IU] | Freq: Three times a day (TID) | INTRAMUSCULAR | Status: DC
  Administered 2012-04-26: 5000 [IU] via SUBCUTANEOUS
  Filled 2012-04-25 (×4): qty 1

## 2012-04-25 MED ORDER — INSULIN ASPART 100 UNIT/ML ~~LOC~~ SOLN
0.0000 [IU] | Freq: Three times a day (TID) | SUBCUTANEOUS | Status: DC
  Administered 2012-04-26: 2 [IU] via SUBCUTANEOUS
  Administered 2012-04-26: 3 [IU] via SUBCUTANEOUS

## 2012-04-25 MED ORDER — PROPOFOL 10 MG/ML IV EMUL
INTRAVENOUS | Status: DC | PRN
  Administered 2012-04-25: 200 mg via INTRAVENOUS

## 2012-04-25 MED ORDER — ONDANSETRON HCL 4 MG/2ML IJ SOLN
INTRAMUSCULAR | Status: DC | PRN
  Administered 2012-04-25 (×2): 2 mg via INTRAVENOUS

## 2012-04-25 MED ORDER — ACETAMINOPHEN 10 MG/ML IV SOLN
INTRAVENOUS | Status: DC | PRN
  Administered 2012-04-25: 1000 mg via INTRAVENOUS

## 2012-04-25 MED ORDER — POTASSIUM CHLORIDE IN NACL 20-0.9 MEQ/L-% IV SOLN
INTRAVENOUS | Status: DC
  Administered 2012-04-25 – 2012-04-26 (×2): via INTRAVENOUS
  Filled 2012-04-25 (×3): qty 1000

## 2012-04-25 MED ORDER — NEOSTIGMINE METHYLSULFATE 1 MG/ML IJ SOLN
INTRAMUSCULAR | Status: DC | PRN
  Administered 2012-04-25: 3 mg via INTRAVENOUS

## 2012-04-25 MED ORDER — FENTANYL CITRATE 0.05 MG/ML IJ SOLN: 25.0000 ug | INTRAMUSCULAR | Status: DC | PRN

## 2012-04-25 MED ORDER — LACTATED RINGERS IV SOLN: INTRAVENOUS | Status: DC

## 2012-04-25 MED ORDER — LACTATED RINGERS IR SOLN
Status: DC | PRN
  Administered 2012-04-25: 1

## 2012-04-25 MED ORDER — LACTATED RINGERS IV SOLN
INTRAVENOUS | Status: DC | PRN
  Administered 2012-04-25 (×2): via INTRAVENOUS

## 2012-04-25 MED ORDER — SODIUM CHLORIDE 0.9 % IJ SOLN
INTRAMUSCULAR | Status: DC | PRN
  Administered 2012-04-25: 10:00:00

## 2012-04-25 MED ORDER — MIDAZOLAM HCL 5 MG/5ML IJ SOLN
INTRAMUSCULAR | Status: DC | PRN
  Administered 2012-04-25 (×2): 0.5 mg via INTRAVENOUS
  Administered 2012-04-25: 1 mg via INTRAVENOUS

## 2012-04-25 MED ORDER — FLUTICASONE PROPIONATE HFA 44 MCG/ACT IN AERO
2.0000 | INHALATION_SPRAY | Freq: Two times a day (BID) | RESPIRATORY_TRACT | Status: DC
  Administered 2012-04-25 – 2012-04-26 (×2): 2 via RESPIRATORY_TRACT
  Filled 2012-04-25: qty 10.6

## 2012-04-25 MED ORDER — OXYCODONE-ACETAMINOPHEN 5-325 MG PO TABS
1.0000 | ORAL_TABLET | ORAL | Status: DC | PRN
  Administered 2012-04-25 – 2012-04-26 (×4): 2 via ORAL
  Filled 2012-04-25 (×4): qty 2

## 2012-04-25 MED ORDER — LIDOCAINE HCL (CARDIAC) 20 MG/ML IV SOLN
INTRAVENOUS | Status: DC | PRN
  Administered 2012-04-25: 30 mg via INTRAVENOUS

## 2012-04-25 MED ORDER — GLYCOPYRROLATE 0.2 MG/ML IJ SOLN
INTRAMUSCULAR | Status: DC | PRN
  Administered 2012-04-25: 0.4 mg via INTRAVENOUS

## 2012-04-25 MED ORDER — SUCCINYLCHOLINE CHLORIDE 20 MG/ML IJ SOLN
INTRAMUSCULAR | Status: DC | PRN
  Administered 2012-04-25: 150 mg via INTRAVENOUS

## 2012-04-25 MED ORDER — 0.9 % SODIUM CHLORIDE (POUR BTL) OPTIME
TOPICAL | Status: DC | PRN
  Administered 2012-04-25: 1000 mL

## 2012-04-25 MED ORDER — CISATRACURIUM BESYLATE (PF) 10 MG/5ML IV SOLN
INTRAVENOUS | Status: DC | PRN
  Administered 2012-04-25: 10 mg via INTRAVENOUS

## 2012-04-25 MED ORDER — HYDROMORPHONE HCL PF 1 MG/ML IJ SOLN
INTRAMUSCULAR | Status: DC | PRN
  Administered 2012-04-25 (×4): 0.5 mg via INTRAVENOUS

## 2012-04-25 MED ORDER — BUPIVACAINE-EPINEPHRINE 0.25% -1:200000 IJ SOLN
INTRAMUSCULAR | Status: DC | PRN
  Administered 2012-04-25: 24 mL

## 2012-04-25 MED ORDER — FENTANYL CITRATE 0.05 MG/ML IJ SOLN
INTRAMUSCULAR | Status: DC | PRN
  Administered 2012-04-25 (×2): 25 ug via INTRAVENOUS
  Administered 2012-04-25 (×3): 50 ug via INTRAVENOUS
  Administered 2012-04-25: 150 ug via INTRAVENOUS

## 2012-04-25 SURGICAL SUPPLY — 41 items
APPLIER CLIP ROT 10 11.4 M/L (STAPLE) ×2
BENZOIN TINCTURE PRP APPL 2/3 (GAUZE/BANDAGES/DRESSINGS) IMPLANT
CANISTER SUCTION 2500CC (MISCELLANEOUS) ×2 IMPLANT
CATH REDDICK CHOLANGI 4FR 50CM (CATHETERS) IMPLANT
CHLORAPREP W/TINT 26ML (MISCELLANEOUS) ×2 IMPLANT
CLIP APPLIE ROT 10 11.4 M/L (STAPLE) ×1 IMPLANT
CLOTH BEACON ORANGE TIMEOUT ST (SAFETY) ×2 IMPLANT
COVER MAYO STAND STRL (DRAPES) ×2 IMPLANT
DECANTER SPIKE VIAL GLASS SM (MISCELLANEOUS) ×2 IMPLANT
DERMABOND ADVANCED (GAUZE/BANDAGES/DRESSINGS) ×1
DERMABOND ADVANCED .7 DNX12 (GAUZE/BANDAGES/DRESSINGS) ×1 IMPLANT
DRAPE C-ARM 42X72 X-RAY (DRAPES) ×2 IMPLANT
DRAPE LAPAROSCOPIC ABDOMINAL (DRAPES) ×2 IMPLANT
DRAPE UTILITY 15X26 (DRAPE) ×2 IMPLANT
ELECT REM PT RETURN 9FT ADLT (ELECTROSURGICAL) ×2
ELECTRODE REM PT RTRN 9FT ADLT (ELECTROSURGICAL) ×1 IMPLANT
GLOVE BIOGEL PI IND STRL 7.0 (GLOVE) ×1 IMPLANT
GLOVE BIOGEL PI INDICATOR 7.0 (GLOVE) ×1
GLOVE SS BIOGEL STRL SZ 7.5 (GLOVE) ×1 IMPLANT
GLOVE SUPERSENSE BIOGEL SZ 7.5 (GLOVE) ×1
GOWN STRL NON-REIN LRG LVL3 (GOWN DISPOSABLE) IMPLANT
GOWN STRL REIN XL XLG (GOWN DISPOSABLE) ×6 IMPLANT
HEMOSTAT SURGICEL 4X8 (HEMOSTASIS) IMPLANT
IV CATH 14GX2 1/4 (CATHETERS) IMPLANT
IV SET EXT 30 76VOL 4 MALE LL (IV SETS) IMPLANT
KIT BASIN OR (CUSTOM PROCEDURE TRAY) ×2 IMPLANT
NS IRRIG 1000ML POUR BTL (IV SOLUTION) IMPLANT
POUCH SPECIMEN RETRIEVAL 10MM (ENDOMECHANICALS) ×2 IMPLANT
SET CHOLANGIOGRAPH MIX (MISCELLANEOUS) ×2 IMPLANT
SET IRRIG TUBING LAPAROSCOPIC (IRRIGATION / IRRIGATOR) ×2 IMPLANT
SOLUTION ANTI FOG 6CC (MISCELLANEOUS) ×2 IMPLANT
STRIP CLOSURE SKIN 1/2X4 (GAUZE/BANDAGES/DRESSINGS) IMPLANT
SUT MNCRL AB 4-0 PS2 18 (SUTURE) ×2 IMPLANT
TOWEL OR 17X26 10 PK STRL BLUE (TOWEL DISPOSABLE) ×4 IMPLANT
TRAY LAP CHOLE (CUSTOM PROCEDURE TRAY) ×2 IMPLANT
TROCAR SLEEVE XCEL 5X75 (ENDOMECHANICALS) ×2 IMPLANT
TROCAR XCEL BLADELESS 5X75MML (TROCAR) ×2 IMPLANT
TROCAR XCEL BLUNT TIP 100MML (ENDOMECHANICALS) ×2 IMPLANT
TROCAR XCEL NON-BLD 11X100MML (ENDOMECHANICALS) ×2 IMPLANT
TROCAR Z-THREAD FIOS 5X100MM (TROCAR) IMPLANT
TUBING INSUFFLATION 10FT LAP (TUBING) ×2 IMPLANT

## 2012-04-25 NOTE — Care Management Note (Signed)
    Page 1 of 1   04/25/2012     2:56:17 PM   CARE MANAGEMENT NOTE 04/25/2012  Patient:  April Byrd, April Byrd   Account Number:  [id number]  Date Initiated:  04/25/2012  Documentation initiated by:  Lorenda Ishihara  Subjective/Objective Assessment:   37 yo female admitted s/p lap chole. PTA lived at home.     Action/Plan:   Home when stable   Anticipated DC Date:  04/26/2012   Anticipated DC Plan:  HOME/SELF CARE      DC Planning Services  CM consult      Choice offered to / List presented to:             Status of service:  Completed, signed off Medicare Important Message given?   (If response is "NO", the following Medicare IM given date fields will be blank) Date Medicare IM given:   Date Additional Medicare IM given:    Discharge Disposition:  HOME/SELF CARE  Per UR Regulation:  Reviewed for med. necessity/level of care/duration of stay  If discussed at Long Length of Stay Meetings, dates discussed:    Comments:

## 2012-04-25 NOTE — Progress Notes (Signed)
Respiratory Therapy in to administer flovent. Patient was c/o blood sugar being too low and that she was hot. Refusing flovent at this time. RT observed patient taking a medication out of her purse from what appeared to be a prescription bottle. In formed RN of observation and told her we would be back to see if patient wanted flovent

## 2012-04-25 NOTE — Op Note (Signed)
Preoperative diagnosis: Cholelithiasis and cholecystitis  Postoperative diagnosis: Cholelithiasis and cholecystitis  Surgical procedure: Laparoscopic cholecystectomy with intraoperative cholangiogram  Surgeon: Sharlet Salina T. Calvina Liptak M.D.  Assistant: None  Anesthesia: General Endotracheal  Complications: None  Estimated blood loss: Minimal  Description of procedure: The patient brought to the operating room, placed in the supine position on the operating table, and general endotracheal anesthesia induced. The abdomen was widely sterilely prepped and draped. The patient had received preoperative IV antibiotics and PAS were in place. Patient timeout was performed the correct procedure verified. Standard 4 port technique was used with an open Hassan cannula at the umbilicus and the remainder of the ports placed under direct vision. The gallbladder was visualized. It appeared acutely inflamed, tense and edematous.  The GB was initially decompressed with an aspiration needle. The fundus was grasped and elevated up over the liver and the infundibulum retracted inferiolaterally. Peritoneum anterior and posterior to close triangle was incised and fibrofatty tissue stripped off the neck of the gallbladder toward the porta hepatis. The distal gallbladder was thoroughly dissected. The cystic artery was identified in close triangle and the cystic duct gallbladder junction dissected 360.  A good critical view was obtained. When the anatomy was clear the cystic duct was clipped at the gallbladder junction and an operative cholangiogram obtained through the cystic duct. This showed good filling of a normal common bile duct and intrahepatic ducts with free flow into the duodenum and no filling defects. Following this the Cholangiocath was removed and the cystic duct was doubly clipped proximally and divided. The cystic artery was doubly clipped proximally and distally and divided. The gallbladder was dissected free  from its bed using hook cautery and removed through the umbilical port site. Complete hemostasis was obtained in the gallbladder bed. The right upper quadrant was thoroughly irrigated and hemostasis assured. Trochars were removed and all CO2 evacuated and the [facility] trocar site fascial defect closed. Skin incisions were closed with subcuticular Monocryl and Dermabond. Sponge needle and instrument counts were correct. The patient was taken to PACU in good condition.  Demetri Kerman T  04/25/2012

## 2012-04-25 NOTE — Transfer of Care (Signed)
Immediate Anesthesia Transfer of Care Note  Patient: April Byrd  Procedure(s) Performed: Procedure(s): LAPAROSCOPIC CHOLECYSTECTOMY WITH INTRAOPERATIVE CHOLANGIOGRAM (N/A)  Patient Location: PACU  Anesthesia Type:General  Level of Consciousness: awake, alert , oriented and patient cooperative  Airway & Oxygen Therapy: Patient Spontanous Breathing and Patient connected to face mask oxygen  Post-op Assessment: Report given to PACU RN and Post -op Vital signs reviewed and stable  Post vital signs: stable  Complications: No apparent anesthesia complications

## 2012-04-25 NOTE — Anesthesia Postprocedure Evaluation (Signed)
  Anesthesia Post-op Note  Patient: April Byrd  Procedure(s) Performed: Procedure(s) (LRB): LAPAROSCOPIC CHOLECYSTECTOMY WITH INTRAOPERATIVE CHOLANGIOGRAM (N/A)  Patient Location: PACU  Anesthesia Type: General  Level of Consciousness: awake and alert   Airway and Oxygen Therapy: Patient Spontanous Breathing  Post-op Pain: mild  Post-op Assessment: Post-op Vital signs reviewed, Patient's Cardiovascular Status Stable, Respiratory Function Stable, Patent Airway and No signs of Nausea or vomiting  Last Vitals:  Filed Vitals:   04/25/12 1115  BP: 134/76  Pulse: 58  Temp:   Resp: 6    Post-op Vital Signs: stable   Complications: No apparent anesthesia complications

## 2012-04-26 ENCOUNTER — Telehealth (INDEPENDENT_AMBULATORY_CARE_PROVIDER_SITE_OTHER): Payer: Self-pay | Admitting: *Deleted

## 2012-04-26 ENCOUNTER — Encounter (HOSPITAL_COMMUNITY): Payer: Self-pay

## 2012-04-26 ENCOUNTER — Encounter (INDEPENDENT_AMBULATORY_CARE_PROVIDER_SITE_OTHER): Payer: Self-pay | Admitting: General Surgery

## 2012-04-26 DIAGNOSIS — E669 Obesity, unspecified: Secondary | ICD-10-CM

## 2012-04-26 DIAGNOSIS — K8000 Calculus of gallbladder with acute cholecystitis without obstruction: Secondary | ICD-10-CM

## 2012-04-26 DIAGNOSIS — I10 Essential (primary) hypertension: Secondary | ICD-10-CM

## 2012-04-26 DIAGNOSIS — J45909 Unspecified asthma, uncomplicated: Secondary | ICD-10-CM | POA: Diagnosis present

## 2012-04-26 DIAGNOSIS — E119 Type 2 diabetes mellitus without complications: Secondary | ICD-10-CM

## 2012-04-26 DIAGNOSIS — Z6837 Body mass index (BMI) 37.0-37.9, adult: Secondary | ICD-10-CM

## 2012-04-26 HISTORY — DX: Unspecified asthma, uncomplicated: J45.909

## 2012-04-26 HISTORY — DX: Body mass index (BMI) 37.0-37.9, adult: Z68.37

## 2012-04-26 HISTORY — DX: Type 2 diabetes mellitus without complications: E11.9

## 2012-04-26 HISTORY — DX: Calculus of gallbladder with acute cholecystitis without obstruction: K80.00

## 2012-04-26 HISTORY — DX: Essential (primary) hypertension: I10

## 2012-04-26 LAB — GLUCOSE, CAPILLARY
Glucose-Capillary: 141 mg/dL — ABNORMAL HIGH (ref 70–99)
Glucose-Capillary: 151 mg/dL — ABNORMAL HIGH (ref 70–99)

## 2012-04-26 MED ORDER — ACETAMINOPHEN 325 MG PO TABS: ORAL_TABLET | ORAL | Status: DC

## 2012-04-26 MED ORDER — OXYCODONE-ACETAMINOPHEN 5-325 MG PO TABS: 1.0000 | ORAL_TABLET | ORAL | Status: DC | PRN

## 2012-04-26 MED ORDER — METFORMIN HCL 500 MG PO TABS
1000.0000 mg | ORAL_TABLET | Freq: Two times a day (BID) | ORAL | Status: DC
  Filled 2012-04-26 (×2): qty 2

## 2012-04-26 MED ORDER — GLIMEPIRIDE 2 MG PO TABS
2.0000 mg | ORAL_TABLET | Freq: Every day | ORAL | Status: DC
  Filled 2012-04-26: qty 1

## 2012-04-26 MED ORDER — TRAMADOL-ACETAMINOPHEN 37.5-325 MG PO TABS
1.0000 | ORAL_TABLET | Freq: Four times a day (QID) | ORAL | Status: DC | PRN
  Filled 2012-04-26: qty 2

## 2012-04-26 NOTE — Discharge Summary (Signed)
Physician Discharge Summary  Patient ID: April Byrd MRN: [id number] DOB/AGE: [birth year] 36 y.o.  Admit date: 04/24/2012 Discharge date: 04/26/2012  Admission Diagnoses: Cholelithiasis and cholecystitis  Diabetes mellitus  Hypertension  Asthma  Body mass index is 37.6   Discharge Diagnoses: Same Principal Problem:   Calculus of gallbladder with acute cholecystitis, without mention of obstruction Active Problems:   Type II or unspecified type diabetes mellitus without mention of complication, not stated as uncontrolled   Unspecified essential hypertension   Unspecified asthma   BMI 37.0-37.9,adult   PROCEDURES: Laparoscopic cholecystectomy with intraoperative cholangiogram, 04/25/12  Dr. Sharlet [facility] Course: Patient is a 37 year old female presented to Dr. Jone Baseman office with epigastric pain started on 04/20/12. She developed abdominal pain 04/21/12, which also went to her back became progressively worse over the next 3 days. She presented to Dr. Valentina Lucks yesterday with abdominal pain, mid abdomen going toward the back. She has no prior history of this, type of pain; she's been able to eat and drink some with this. She would have nausea but no vomiting until this a.m. She vomited after eating hash browns.  Labs yesterday showed a white count of 6.8, hemoglobin of 13, hematocrit of 40.8, platelets were 229,000. Urinalysis was positive for blood LFTs were normal. She underwent abdominal ultrasound this morning which showed gallstones measuring up to 2.8 cm and 1.8 cm lodged in the neck of the gallbladder on ultrasound this AM. Gallbladder wall thickening measuring up to 3.6 mm. Patient was then reported to be non- tender pm Ultrasound exam. Common bile duct was 4.2 mm liver was heterogeneous with a suggestion of fatty infiltration. She was referred to Dr. Johna Sheriff by Dr. Valentina Lucks. By mutual agreement she was transferred to [facility] for elective admission. She was  admitted and underwent surgery the next day.  She is sore but doing well and we plan discharge today.  Follow up in clinic in 2 weeks.  Condition on d/c:  Improved   Disposition: 01-Home or Self Care      Discharge Orders   Future Orders Complete By Expires     Amb Referral to Nutrition and Diabetic E  As directed         Medication List    TAKE these medications       acetaminophen 325 MG tablet  Commonly known as:  TYLENOL  Do not take more than 4000 mg of tylenol (acetaminophen) in any 24 hour period.  This is also in your prescribed pain medicine.     ASMANEX 14 METERED DOSES 220 MCG/INH inhaler  Generic drug:  mometasone  Inhale 2 puffs into the lungs 2 (two) times daily.     glimepiride 2 MG tablet  Commonly known as:  AMARYL  Take 2 mg by mouth daily before breakfast.     metFORMIN 1000 MG tablet  Commonly known as:  GLUCOPHAGE  Take 1,000 mg by mouth 2 (two) times daily with a meal.     oxyCODONE-acetaminophen 5-325 MG per tablet  Commonly known as:  PERCOCET/ROXICET  Take 1-2 tablets by mouth every 4 (four) hours as needed.     traMADol-acetaminophen 37.5-325 MG per tablet  Commonly known as:  ULTRACET  Take 1-2 tablets by mouth every 6 (six) hours as needed for pain.       Follow-up Information   Follow up with Byrd,BENJAMIN T, MD. Schedule an appointment as soon as possible for a visit in 2 weeks. (Ask for the [facility] clinic, and  if that is full, Dr. Johna Sheriff)    Contact information:   [address] [place] [address] [phone]       Follow up with Default, Provider, MD. (Call your primary care doctor for follow up and especially issues with your blood pressure and diabetes.)    Contact information:   [address] [place] [address] [phone]       Signed: Sherrie George 04/26/2012, 10:34 AM

## 2012-04-26 NOTE — Progress Notes (Signed)
Assessment unchanged. Pain decreased since recent analgesic given. Pt given dc instructions earlier with verbalized understanding. Pt verbalized when to follow-up with MD and when to call if problems occur. Script x 1 given as provided by MD. Discharged via wheelchair to front entrance to meet awaiting vehicle to carry home. Accompanied by NT.

## 2012-04-26 NOTE — Progress Notes (Signed)
1 Day Post-Op  Subjective: Still pretty sore, but able to eat and get around.  Objective: Vital signs in last 24 hours: Temp:  [97.5 F (36.4 C)-98.9 F (37.2 C)] 98.6 F (37 C) (04/03 0601) Pulse Rate:  [58-91] 68 (04/03 0601) Resp:  [6-18] 18 (04/03 0601) BP: (99-138)/(63-90) 99/63 mmHg (04/03 0601) SpO2:  [95 %-100 %] 97 % (04/03 0802) Last BM Date: 04/24/12 Diet: carb modified, Afebrile, VSS, no labs Intake/Output from previous day: 04/02 0701 - 04/03 0700 In: 2401.3 [I.V.:2401.3] Out: 2510 [Urine:2500; Blood:10] Intake/Output this shift: Total I/O In: -  Out: 250 [Urine:250]  General appearance: alert, cooperative and no distress GI: soft tender still fairly sore, tolerating PO's, incisions look good.  Lab Results:   Recent Labs  04/25/12 0422  WBC 5.0  HGB 11.4*  HCT 34.2*  PLT 191    BMET  Recent Labs  04/24/12 1420 04/25/12 0422  NA 135 132*  K 3.8 3.6  CL 101 100  CO2 23 27  GLUCOSE 132* 147*  BUN 9 7  CREATININE 0.74 0.82  CALCIUM 9.0 8.3*   PT/INR No results found for this basename: LABPROT, INR,  in the last 72 hours   Recent Labs Lab 04/25/12 0422  AST 19  ALT 16  ALKPHOS 74  BILITOT 0.9  PROT 6.6  ALBUMIN 2.9*     Lipase     Component Value Date/Time   LIPASE 14 04/25/2012 0422     Studies/Results: Dg Cholangiogram Operative  04/25/2012  *RADIOLOGY REPORT*  Clinical Data:   Cholelithiasis  INTRAOPERATIVE CHOLANGIOGRAM  Technique:  Cholangiographic images from the C-arm fluoroscopic device were submitted for interpretation post-operatively.  Please see the procedural report for the amount of contrast and the fluoroscopy time utilized.  Comparison:  Findings:  No persistent filling defects in the common duct. Intrahepatic ducts are incompletely visualized, appearing decompressed centrally. Contrast passes into the duodenum.  IMPRESSION  Negative for retained common duct stone.   Original Report Authenticated By: D. Andria Rhein, MD      Medications: . ciprofloxacin  400 mg Intravenous Q12H  . fluticasone  2 puff Inhalation BID  . heparin subcutaneous  5,000 Units Subcutaneous Q8H  . insulin aspart  0-15 Units Subcutaneous TID WC    Assessment/Plan Cholelithiasis and cholecystitis Diabetes mellitus  Hypertension  Asthma  Body mass index is 37.6   Plan:  Home today, follow up in 2 weeks.  LOS: 2 days    Quantavious Eggert 04/26/2012

## 2012-04-26 NOTE — Telephone Encounter (Signed)
Left message for patient that her return to work note was not in her discharge paperwork from the hospital so it is being mailed to her today.

## 2012-04-27 ENCOUNTER — Encounter (HOSPITAL_COMMUNITY): Payer: Self-pay | Admitting: General Surgery

## 2012-04-27 NOTE — Progress Notes (Signed)
Patient interviewed and examined, agree with PA note above.  Mariella Saa MD, FACS  04/27/2012 5:35 PM

## 2012-04-30 ENCOUNTER — Encounter (INDEPENDENT_AMBULATORY_CARE_PROVIDER_SITE_OTHER): Payer: Self-pay

## 2012-05-07 ENCOUNTER — Telehealth (INDEPENDENT_AMBULATORY_CARE_PROVIDER_SITE_OTHER): Payer: Self-pay

## 2012-05-07 NOTE — Telephone Encounter (Signed)
Return to work letter faxed to [phone] at patient's request.  Fax confirmation rec'd

## 2012-05-15 ENCOUNTER — Encounter (INDEPENDENT_AMBULATORY_CARE_PROVIDER_SITE_OTHER): Payer: Self-pay | Admitting: Internal Medicine

## 2012-05-15 ENCOUNTER — Telehealth (INDEPENDENT_AMBULATORY_CARE_PROVIDER_SITE_OTHER): Payer: Self-pay | Admitting: General Surgery

## 2012-05-15 ENCOUNTER — Encounter (INDEPENDENT_AMBULATORY_CARE_PROVIDER_SITE_OTHER): Payer: Self-pay | Admitting: General Surgery

## 2012-05-15 ENCOUNTER — Ambulatory Visit (INDEPENDENT_AMBULATORY_CARE_PROVIDER_SITE_OTHER): Payer: PRIVATE HEALTH INSURANCE | Admitting: Internal Medicine

## 2012-05-15 VITALS — BP 112/76 | HR 76 | Temp 99.2°F | Resp 16 | Ht 64.0 in | Wt 226.8 lb

## 2012-05-15 DIAGNOSIS — K8010 Calculus of gallbladder with chronic cholecystitis without obstruction: Secondary | ICD-10-CM

## 2012-05-15 NOTE — Telephone Encounter (Signed)
April Byrd from [employer] called to confirm the patient presented a letter today stating she would be out of work until 05/26/12. I rechecked the letter that was sent with her today. The date that was listed was to return to work with no restrictions on 05/22/2012. The patient has altered the date and presented to the [employer]. The date had been changed to 05/26/2012. I faxed a copy of the letter to April Byrd at [phone]. I called April Byrd back to confirm she did receive the fax and go over the correct dates.

## 2012-05-15 NOTE — Progress Notes (Signed)
  Subjective: Pt returns to the clinic today after undergoing laparoscopic cholecystectomy on 04/25/12.  The patient is tolerating their diet well although has had some intermittent nausea with eating.  This is resolving now.  She denies weight loss.  Bowels are returning to normal.  Denies significant pain. Objective: Vital signs in last 24 hours: Reviewed  PE: Abd: soft, non-tender, +bs, incisions well healed  Lab Results:  No results found for this basename: WBC, HGB, HCT, PLT,  in the last 72 hours BMET No results found for this basename: NA, K, CL, CO2, GLUCOSE, BUN, CREATININE, CALCIUM,  in the last 72 hours PT/INR No results found for this basename: LABPROT, INR,  in the last 72 hours CMP     Component Value Date/Time   NA 132* 04/25/2012 0422   K 3.6 04/25/2012 0422   CL 100 04/25/2012 0422   CO2 27 04/25/2012 0422   GLUCOSE 147* 04/25/2012 0422   BUN 7 04/25/2012 0422   CREATININE 0.82 04/25/2012 0422   CALCIUM 8.3* 04/25/2012 0422   PROT 6.6 04/25/2012 0422   ALBUMIN 2.9* 04/25/2012 0422   AST 19 04/25/2012 0422   ALT 16 04/25/2012 0422   ALKPHOS 74 04/25/2012 0422   BILITOT 0.9 04/25/2012 0422   GFRNONAA >90 04/25/2012 0422   GFRAA >90 04/25/2012 0422   Lipase     Component Value Date/Time   LIPASE 14 04/25/2012 0422       Studies/Results: No results found.  Anti-infectives: Anti-infectives   None       Assessment/Plan  1.  S/P Laparoscopic Cholecystectomy: doing well, may resume regular activity without restrictions, Pt will follow up with [place] [employer] and knows to call with questions or concerns.     WHITE, ELIZABETH 05/15/2012

## 2012-05-15 NOTE — Patient Instructions (Signed)
May resume regular activity without restrictions. Follow up as needed. Call with questions or concerns.  

## 2014-01-18 ENCOUNTER — Emergency Department (HOSPITAL_COMMUNITY)
Admission: EM | Admit: 2014-01-18 | Discharge: 2014-01-18 | Disposition: A | Discharge status: Home / Self Care | Payer: PRIVATE HEALTH INSURANCE | Attending: Emergency Medicine | Admitting: Emergency Medicine

## 2014-01-18 ENCOUNTER — Encounter (HOSPITAL_COMMUNITY): Payer: Self-pay | Admitting: Emergency Medicine

## 2014-01-18 DIAGNOSIS — M79642 Pain in left hand: Secondary | ICD-10-CM | POA: Insufficient documentation

## 2014-01-18 DIAGNOSIS — Z8719 Personal history of other diseases of the digestive system: Secondary | ICD-10-CM | POA: Diagnosis not present

## 2014-01-18 DIAGNOSIS — Z79899 Other long term (current) drug therapy: Secondary | ICD-10-CM | POA: Diagnosis not present

## 2014-01-18 DIAGNOSIS — Z880 Allergy status to penicillin: Secondary | ICD-10-CM | POA: Insufficient documentation

## 2014-01-18 DIAGNOSIS — M7989 Other specified soft tissue disorders: Secondary | ICD-10-CM | POA: Diagnosis not present

## 2014-01-18 DIAGNOSIS — J45909 Unspecified asthma, uncomplicated: Secondary | ICD-10-CM | POA: Diagnosis not present

## 2014-01-18 DIAGNOSIS — E119 Type 2 diabetes mellitus without complications: Secondary | ICD-10-CM | POA: Diagnosis not present

## 2014-01-18 DIAGNOSIS — M25572 Pain in left ankle and joints of left foot: Secondary | ICD-10-CM | POA: Insufficient documentation

## 2014-01-18 DIAGNOSIS — R609 Edema, unspecified: Secondary | ICD-10-CM

## 2014-01-18 DIAGNOSIS — M79641 Pain in right hand: Secondary | ICD-10-CM | POA: Insufficient documentation

## 2014-01-18 DIAGNOSIS — Z7951 Long term (current) use of inhaled steroids: Secondary | ICD-10-CM | POA: Diagnosis not present

## 2014-01-18 DIAGNOSIS — I10 Essential (primary) hypertension: Secondary | ICD-10-CM | POA: Insufficient documentation

## 2014-01-18 LAB — URINALYSIS, ROUTINE W REFLEX MICROSCOPIC
BILIRUBIN URINE: NEGATIVE
Ketones, ur: NEGATIVE mg/dL
Leukocytes, UA: NEGATIVE
Nitrite: NEGATIVE
Protein, ur: 30 mg/dL — AB
SPECIFIC GRAVITY, URINE: 1.037 — AB (ref 1.005–1.030)
UROBILINOGEN UA: 0.2 mg/dL (ref 0.0–1.0)
pH: 5.5 (ref 5.0–8.0)

## 2014-01-18 LAB — URINE MICROSCOPIC-ADD ON

## 2014-01-18 LAB — BASIC METABOLIC PANEL
Anion gap: 11 (ref 5–15)
BUN: 9 mg/dL (ref 6–23)
CHLORIDE: 100 meq/L (ref 96–112)
CO2: 23 mmol/L (ref 19–32)
CREATININE: 0.68 mg/dL (ref 0.50–1.10)
Calcium: 9.4 mg/dL (ref 8.4–10.5)
GLUCOSE: 251 mg/dL — AB (ref 70–99)
Potassium: 4 mmol/L (ref 3.5–5.1)
Sodium: 134 mmol/L — ABNORMAL LOW (ref 135–145)

## 2014-01-18 LAB — CBC
HEMATOCRIT: 41.3 % (ref 36.0–46.0)
HEMOGLOBIN: 13.7 g/dL (ref 12.0–15.0)
MCH: 26.1 pg (ref 26.0–34.0)
MCHC: 33.2 g/dL (ref 30.0–36.0)
MCV: 78.8 fL (ref 78.0–100.0)
Platelets: 256 10*3/uL (ref 150–400)
RBC: 5.24 MIL/uL — ABNORMAL HIGH (ref 3.87–5.11)
RDW: 14 % (ref 11.5–15.5)
WBC: 7.8 10*3/uL (ref 4.0–10.5)

## 2014-01-18 MED ORDER — OXYCODONE-ACETAMINOPHEN 5-325 MG PO TABS
2.0000 | ORAL_TABLET | Freq: Once | ORAL | Status: AC
  Administered 2014-01-18: 2 via ORAL
  Filled 2014-01-18: qty 2

## 2014-01-18 MED ORDER — OXYCODONE-ACETAMINOPHEN 5-325 MG PO TABS: 1.0000 | ORAL_TABLET | ORAL | Status: DC | PRN

## 2014-01-18 NOTE — Discharge Instructions (Signed)

## 2014-01-18 NOTE — ED Notes (Signed)
MD Campos at bedside.  

## 2014-01-18 NOTE — ED Notes (Signed)
Pt aware of the need for urine sample  

## 2014-01-18 NOTE — ED Notes (Signed)
Pt reports increased pain and swelling in both ankles and hands x 3 days Hands appear swollen. Pt is unable to grip with hands or dress self. Denies chest pain, shortness of breath or dizziness

## 2014-01-18 NOTE — ED Provider Notes (Signed)
CSN: [id number]     Arrival date & time 01/18/14  1037 History   First MD Initiated Contact with Patient 01/18/14 1044     Chief Complaint  Patient presents with  . Joint Swelling    pt reports 3 day hx of increased swelling and pain in hand and ankles  . Hand Problem  . Joint Swelling      HPI Patient reports 48 hours of increasing swelling and pain in the bilateral hands.  Also with pain in left ankle.  No recent injury or trauma.  She's never had symptoms like this before.  No history of DVT.  Denies change in her diet.  Reports compliance with her medications.  Past Medical History  Diagnosis Date  . Diabetes mellitus   . Hypertension   . Calculus of gallbladder with acute cholecystitis, without mention of obstruction 04/26/2012  . Type II or unspecified type diabetes mellitus without mention of complication, not stated as uncontrolled 04/26/2012  . Unspecified essential hypertension 04/26/2012  . BMI 37.0-37.9,adult 04/26/2012  . Asthma     sports induced  . Unspecified asthma(493.90) 04/26/2012   Past Surgical History  Procedure Laterality Date  . Cesarean section      x 3  . Hydrocephalus surgery    . Cholecystectomy N/A 04/25/2012    Procedure: LAPAROSCOPIC CHOLECYSTECTOMY WITH INTRAOPERATIVE CHOLANGIOGRAM;  Surgeon: Mariella SaaBenjamin T Hoxworth, MD;  Location: [facility];  Service: General;  Laterality: N/A;  . Tubal ligation     Family History  Problem Relation Age of Onset  . Diabetes Mother   . Hypertension Mother    History  Substance Use Topics  . Smoking status: Never Smoker   . Smokeless tobacco: Never Used  . Alcohol Use: No   OB History    No data available     Review of Systems  All other systems reviewed and are negative.     Allergies  Penicillins  Home Medications   Prior to Admission medications   Medication Sig Start Date End Date Taking? Authorizing Provider  acetaminophen (TYLENOL) 325 MG tablet Do not take more than 4000 mg of tylenol (acetaminophen)  in any 24 hour period.  This is also in your prescribed pain medicine. 04/26/12  Yes Sherrie GeorgeWillard Jennings, PA-C  Aspirin-Acetaminophen (GOODYS BODY PAIN PO) Take 1 each by mouth once.   Yes Historical Provider, MD  glimepiride (AMARYL) 2 MG tablet Take 2 mg by mouth daily before breakfast.   Yes Historical Provider, MD  metFORMIN (GLUCOPHAGE) 1000 MG tablet Take 1,000 mg by mouth 2 (two) times daily with a meal.   Yes Historical Provider, MD  mometasone (ASMANEX 14 METERED DOSES) 220 MCG/INH inhaler Inhale 2 puffs into the lungs 2 (two) times daily.   Yes Historical Provider, MD  BP 128/92 mmHg  Pulse 69  Temp(Src) 97.5 F (36.4 C) (Oral)  Resp 16  SpO2 98%  LMP 01/11/2014 (Approximate) Physical Exam  Constitutional: She is oriented to person, place, and time. She appears well-developed and well-nourished. No distress.  HENT:  Head: Normocephalic and atraumatic.  Eyes: EOM are normal.  Neck: Normal range of motion.  Cardiovascular: Normal rate, regular rhythm and normal heart sounds.   Pulmonary/Chest: Effort normal and breath sounds normal.  Abdominal: Soft. She exhibits no distension. There is no tenderness.  Musculoskeletal: Normal range of motion.  Mild bilateral hand swelling and edema of fingers.  Mild pain with range of motion of bilateral wrists.  No erythema.  No significant edema of the  lower extremities.  Mild pain with range of motion of left ankle without obvious effusion.  Normal pulses distally.  Neurological: She is alert and oriented to person, place, and time.  Skin: Skin is warm and dry.  Psychiatric: She has a normal mood and affect. Judgment normal.  Nursing note and vitals reviewed.   ED Course  Procedures (including critical care time) Labs Review Labs Reviewed  CBC - Abnormal; Notable for the following:    RBC 5.24 (*)    All other components within normal limits  BASIC METABOLIC PANEL - Abnormal; Notable for the following:    Sodium 134 (*)    Glucose, Bld 251  (*)    All other components within normal limits  URINALYSIS, ROUTINE W REFLEX MICROSCOPIC - Abnormal; Notable for the following:    Specific Gravity, Urine 1.037 (*)    Glucose, UA >1000 (*)    Hgb urine dipstick MODERATE (*)    Protein, ur 30 (*)    All other components within normal limits  URINE MICROSCOPIC-ADD ON - Abnormal; Notable for the following:    Squamous Epithelial / LPF FEW (*)    All other components within normal limits    Imaging Review No results found.   EKG Interpretation None      MDM   Final diagnoses:  Bilateral hand pain  Swelling    Nonspecific edema.  Kidney function is normal.  Patient will follow-up with her primary care physician.  Polyarthritis and edema.  Outpatient workup.  No life-threatening emergency present today.  No protein in urine.  Doubt nephrotic syndrome.    Lyanne CoKevin M Grace Haggart, MD 01/18/14 [id number]

## 2014-01-18 NOTE — ED Notes (Signed)
UNSUCCESSFUL LAB DRAW X 1 RN AWARE

## 2014-01-20 ENCOUNTER — Other Ambulatory Visit: Payer: Self-pay | Admitting: Internal Medicine

## 2014-01-20 ENCOUNTER — Other Ambulatory Visit (HOSPITAL_COMMUNITY)
Admission: RE | Admit: 2014-01-20 | Discharge: 2014-01-20 | Disposition: A | Discharge status: Home / Self Care | Payer: PRIVATE HEALTH INSURANCE | Source: Ambulatory Visit | Attending: Internal Medicine | Admitting: Internal Medicine

## 2014-01-20 DIAGNOSIS — Z1151 Encounter for screening for human papillomavirus (HPV): Secondary | ICD-10-CM | POA: Insufficient documentation

## 2014-01-20 DIAGNOSIS — Z01419 Encounter for gynecological examination (general) (routine) without abnormal findings: Secondary | ICD-10-CM | POA: Insufficient documentation

## 2014-01-22 LAB — CYTOLOGY - PAP

## 2016-07-22 ENCOUNTER — Encounter: Payer: PRIVATE HEALTH INSURANCE | Admitting: Family Medicine

## 2016-07-23 ENCOUNTER — Encounter: Payer: Self-pay | Admitting: Family Medicine

## 2016-07-23 NOTE — Progress Notes (Signed)
Patient did not keep appointment. She is a new patient. She may re-schedule at her convenience.

## 2016-08-12 ENCOUNTER — Emergency Department (HOSPITAL_COMMUNITY)
Admission: EM | Admit: 2016-08-12 | Discharge: 2016-08-12 | Disposition: A | Discharge status: Home / Self Care | Payer: PRIVATE HEALTH INSURANCE | Attending: Emergency Medicine | Admitting: Emergency Medicine

## 2016-08-12 ENCOUNTER — Encounter (HOSPITAL_COMMUNITY): Payer: Self-pay | Admitting: *Deleted

## 2016-08-12 DIAGNOSIS — J45909 Unspecified asthma, uncomplicated: Secondary | ICD-10-CM | POA: Insufficient documentation

## 2016-08-12 DIAGNOSIS — Z202 Contact with and (suspected) exposure to infections with a predominantly sexual mode of transmission: Secondary | ICD-10-CM | POA: Insufficient documentation

## 2016-08-12 DIAGNOSIS — Z7984 Long term (current) use of oral hypoglycemic drugs: Secondary | ICD-10-CM | POA: Insufficient documentation

## 2016-08-12 DIAGNOSIS — Z711 Person with feared health complaint in whom no diagnosis is made: Secondary | ICD-10-CM

## 2016-08-12 DIAGNOSIS — E119 Type 2 diabetes mellitus without complications: Secondary | ICD-10-CM | POA: Insufficient documentation

## 2016-08-12 DIAGNOSIS — B9689 Other specified bacterial agents as the cause of diseases classified elsewhere: Secondary | ICD-10-CM

## 2016-08-12 DIAGNOSIS — Z79899 Other long term (current) drug therapy: Secondary | ICD-10-CM | POA: Insufficient documentation

## 2016-08-12 DIAGNOSIS — I10 Essential (primary) hypertension: Secondary | ICD-10-CM | POA: Insufficient documentation

## 2016-08-12 DIAGNOSIS — R110 Nausea: Secondary | ICD-10-CM | POA: Insufficient documentation

## 2016-08-12 DIAGNOSIS — N760 Acute vaginitis: Secondary | ICD-10-CM | POA: Insufficient documentation

## 2016-08-12 DIAGNOSIS — Z9049 Acquired absence of other specified parts of digestive tract: Secondary | ICD-10-CM | POA: Insufficient documentation

## 2016-08-12 DIAGNOSIS — R102 Pelvic and perineal pain: Secondary | ICD-10-CM | POA: Insufficient documentation

## 2016-08-12 LAB — URINALYSIS, ROUTINE W REFLEX MICROSCOPIC
BILIRUBIN URINE: NEGATIVE
HGB URINE DIPSTICK: NEGATIVE
Ketones, ur: NEGATIVE mg/dL
NITRITE: NEGATIVE
PROTEIN: 30 mg/dL — AB
SPECIFIC GRAVITY, URINE: 1.03 (ref 1.005–1.030)
pH: 6 (ref 5.0–8.0)

## 2016-08-12 LAB — COMPREHENSIVE METABOLIC PANEL
ALK PHOS: 101 U/L (ref 38–126)
ALT: 14 U/L (ref 14–54)
ANION GAP: 7 (ref 5–15)
AST: 16 U/L (ref 15–41)
Albumin: 3.7 g/dL (ref 3.5–5.0)
BILIRUBIN TOTAL: 0.5 mg/dL (ref 0.3–1.2)
BUN: 6 mg/dL (ref 6–20)
CALCIUM: 9.2 mg/dL (ref 8.9–10.3)
CO2: 28 mmol/L (ref 22–32)
Chloride: 99 mmol/L — ABNORMAL LOW (ref 101–111)
Creatinine, Ser: 0.72 mg/dL (ref 0.44–1.00)
Glucose, Bld: 272 mg/dL — ABNORMAL HIGH (ref 65–99)
POTASSIUM: 3.8 mmol/L (ref 3.5–5.1)
Sodium: 134 mmol/L — ABNORMAL LOW (ref 135–145)
TOTAL PROTEIN: 7.6 g/dL (ref 6.5–8.1)

## 2016-08-12 LAB — CBC
HEMATOCRIT: 39.9 % (ref 36.0–46.0)
HEMOGLOBIN: 13.1 g/dL (ref 12.0–15.0)
MCH: 26.3 pg (ref 26.0–34.0)
MCHC: 32.8 g/dL (ref 30.0–36.0)
MCV: 80.1 fL (ref 78.0–100.0)
Platelets: 229 10*3/uL (ref 150–400)
RBC: 4.98 MIL/uL (ref 3.87–5.11)
RDW: 14 % (ref 11.5–15.5)
WBC: 6.3 10*3/uL (ref 4.0–10.5)

## 2016-08-12 LAB — WET PREP, GENITAL
SPERM: NONE SEEN
TRICH WET PREP: NONE SEEN
Yeast Wet Prep HPF POC: NONE SEEN

## 2016-08-12 LAB — I-STAT BETA HCG BLOOD, ED (MC, WL, AP ONLY)

## 2016-08-12 LAB — LIPASE, BLOOD: Lipase: 24 U/L (ref 11–51)

## 2016-08-12 MED ORDER — CEFTRIAXONE SODIUM 250 MG IJ SOLR
250.0000 mg | Freq: Once | INTRAMUSCULAR | Status: AC
  Administered 2016-08-12: 250 mg via INTRAMUSCULAR
  Filled 2016-08-12: qty 250

## 2016-08-12 MED ORDER — DOXYCYCLINE HYCLATE 100 MG PO CAPS: 100.0000 mg | ORAL_CAPSULE | Freq: Two times a day (BID) | ORAL | 0 refills | Status: DC

## 2016-08-12 MED ORDER — ONDANSETRON HCL 4 MG/2ML IJ SOLN
4.0000 mg | Freq: Once | INTRAMUSCULAR | Status: AC
  Administered 2016-08-12: 4 mg via INTRAVENOUS
  Filled 2016-08-12: qty 2

## 2016-08-12 MED ORDER — SODIUM CHLORIDE 0.9 % IV BOLUS (SEPSIS)
1000.0000 mL | Freq: Once | INTRAVENOUS | Status: AC
  Administered 2016-08-12: 1000 mL via INTRAVENOUS

## 2016-08-12 MED ORDER — METRONIDAZOLE 500 MG PO TABS
2000.0000 mg | ORAL_TABLET | Freq: Once | ORAL | Status: AC
  Administered 2016-08-12: 2000 mg via ORAL
  Filled 2016-08-12: qty 4

## 2016-08-12 MED ORDER — AZITHROMYCIN 250 MG PO TABS
1000.0000 mg | ORAL_TABLET | Freq: Once | ORAL | Status: AC
  Administered 2016-08-12: 1000 mg via ORAL
  Filled 2016-08-12: qty 4

## 2016-08-12 NOTE — ED Provider Notes (Signed)
[facility]-EMERGENCY DEPT Provider Note   CSN: [id number] Arrival date & time: 08/12/16  1556     History   Chief Complaint Chief Complaint  Patient presents with  . Abdominal Pain  . Fatigue    HPI April Byrd is a 41 y.o. female.  April Byrd is a 41 y.o. Female who presents to the complaining of fatigue and bilateral lower abdominal pain ongoing for the past 3 days. Patient reports that her menstrual cycle one week ago and since his had some bilateral lower abdominal cramping. She reports associated fatigue and has been napping more recently over the past week. She reports some intermittent nausea, but none currently. No vomiting or diarrhea. Last BM was today and was normal. Previous abdominal surgical history includes cholecystectomy and C-section. She is sexually active with one partner. She denies vaginal bleeding or discharge. No treatments attempted prior to arrival. No history of thyroid problems. She denies fevers, vomiting, diarrhea, rashes, vaginal bleeding, vaginal discharge, urinary symptoms, chest pain, shortness of breath, palpitations.    The history is provided by the patient and medical records. No language interpreter was used.  Abdominal Pain   Associated symptoms include nausea. Pertinent negatives include fever, diarrhea, vomiting, dysuria, frequency, hematuria and headaches.    Past Medical History:  Diagnosis Date  . Asthma    sports induced  . BMI 37.0-37.9,adult 04/26/2012  . Calculus of gallbladder with acute cholecystitis, without mention of obstruction 04/26/2012  . Diabetes mellitus   . Hypertension   . Type II or unspecified type diabetes mellitus without mention of complication, not stated as uncontrolled 04/26/2012  . Unspecified asthma(493.90) 04/26/2012  . Unspecified essential hypertension 04/26/2012    Patient Active Problem List   Diagnosis Date Noted  . Calculus of gallbladder with acute cholecystitis, without mention of obstruction 04/26/2012    . Type II or unspecified type diabetes mellitus without mention of complication, not stated as uncontrolled 04/26/2012  . Unspecified essential hypertension 04/26/2012  . Unspecified asthma(493.90) 04/26/2012  . BMI 37.0-37.9,adult 04/26/2012    Past Surgical History:  Procedure Laterality Date  . CESAREAN SECTION     x 3  . CHOLECYSTECTOMY N/A 04/25/2012   Procedure: LAPAROSCOPIC CHOLECYSTECTOMY WITH INTRAOPERATIVE CHOLANGIOGRAM;  Surgeon: Mariella SaaBenjamin T Hoxworth, MD;  Location: [facility];  Service: General;  Laterality: N/A;  . hydrocephalus surgery    . TUBAL LIGATION      OB History    No data available       Home Medications    Prior to Admission medications   Medication Sig Start Date End Date Taking? Authorizing Provider  acetaminophen (TYLENOL) 325 MG tablet Do not take more than 4000 mg of tylenol (acetaminophen) in any 24 hour period.  This is also in your prescribed pain medicine. 04/26/12   Sherrie GeorgeJennings, Willard, PA-C  Aspirin-Acetaminophen (GOODYS BODY PAIN PO) Take 1 each by mouth once.    [provider]  doxycycline (VIBRAMYCIN) 100 MG capsule Take 1 capsule (100 mg total) by mouth 2 (two) times daily. 08/12/16   Everlene Farrieransie, Brendaliz Kuk, PA-C  glimepiride (AMARYL) 2 MG tablet Take 2 mg by mouth daily before breakfast.    [provider]  metFORMIN (GLUCOPHAGE) 1000 MG tablet Take 1,000 mg by mouth 2 (two) times daily with a meal.    [provider]  mometasone (ASMANEX 14 METERED DOSES) 220 MCG/INH inhaler Inhale 2 puffs into the lungs 2 (two) times daily.    [provider]  oxyCODONE-acetaminophen (PERCOCET/ROXICET) 5-325 MG per tablet Take  1 tablet by mouth every 4 (four) hours as needed for severe pain. 01/18/14   Azalia Bilis, MD    Family History Family History  Problem Relation Age of Onset  . Diabetes Mother   . Hypertension Mother     Social History Social History  Substance Use Topics  . Smoking status: Never Smoker  . Smokeless  tobacco: Never Used  . Alcohol use No     Allergies   Penicillins   Review of Systems Review of Systems  Constitutional: Positive for fatigue. Negative for chills and fever.  HENT: Negative for congestion and sore throat.   Eyes: Negative for visual disturbance.  Respiratory: Negative for cough and shortness of breath.   Cardiovascular: Negative for chest pain and palpitations.  Gastrointestinal: Positive for abdominal pain and nausea. Negative for blood in stool, diarrhea and vomiting.  Genitourinary: Negative for difficulty urinating, dysuria, flank pain, frequency, hematuria, urgency, vaginal bleeding and vaginal discharge.  Musculoskeletal: Negative for back pain and neck pain.  Skin: Negative for rash.  Neurological: Negative for light-headedness and headaches.     Physical Exam Updated Vital Signs BP (!) 143/95   Pulse 76   Temp 98.1 F (36.7 C) (Oral)   Resp 17   Ht 5\' 4"  (1.626 m)   Wt 96.2 kg (212 lb)   LMP 08/04/2016   SpO2 100%   BMI 36.39 kg/m   Physical Exam  Constitutional: She appears well-developed and well-nourished. No distress.  Nontoxic appearing.  HENT:  Head: Normocephalic and atraumatic.  Mouth/Throat: Oropharynx is clear and moist.  Eyes: Pupils are equal, round, and reactive to light. Conjunctivae are normal. Right eye exhibits no discharge. Left eye exhibits no discharge.  Neck: Neck supple.  Cardiovascular: Normal rate, regular rhythm, normal heart sounds and intact distal pulses.  Exam reveals no gallop and no friction rub.   No murmur heard. Pulmonary/Chest: Effort normal and breath sounds normal. No respiratory distress. She has no wheezes. She has no rales.  Abdominal: Soft. Bowel sounds are normal. She exhibits no distension and no mass. There is tenderness. There is no rebound and no guarding.  Abdomen is soft. Bowel sounds are present. Patient has mild bilateral lower abdominal tenderness to palpation. No focal tenderness. No  peritoneal signs. No CVA or flank tenderness.  Genitourinary: Vaginal discharge found.  Genitourinary Comments: Pelvic exam performed by me with female nurse tech chaperone. No external lesions or rashes noted. Patient does have moderate amount of vaginal discharge noted. Cervix is closed. No cervical motion tenderness. She does have tenderness to suprapubic region with exam. No adnexal tenderness bilaterally.  Musculoskeletal: She exhibits no edema.  Lymphadenopathy:    She has no cervical adenopathy.  Neurological: She is alert. Coordination normal.  Skin: Skin is warm and dry. Capillary refill takes less than 2 seconds. No rash noted. She is not diaphoretic. No erythema. No pallor.  Psychiatric: She has a normal mood and affect. Her behavior is normal.  Nursing note and vitals reviewed.    ED Treatments / Results  Labs (all labs ordered are listed, but only abnormal results are displayed) Labs Reviewed  WET PREP, GENITAL - Abnormal; Notable for the following:       Result Value   Clue Cells Wet Prep HPF POC PRESENT (*)    WBC, Wet Prep HPF POC TOO NUMEROUS TO COUNT (*)    All other components within normal limits  COMPREHENSIVE METABOLIC PANEL - Abnormal; Notable for the following:    Sodium  134 (*)    Chloride 99 (*)    Glucose, Bld 272 (*)    All other components within normal limits  URINALYSIS, ROUTINE W REFLEX MICROSCOPIC - Abnormal; Notable for the following:    APPearance HAZY (*)    Glucose, UA >=500 (*)    Protein, ur 30 (*)    Leukocytes, UA MODERATE (*)    Bacteria, UA RARE (*)    Squamous Epithelial / LPF 0-5 (*)    All other components within normal limits  URINE CULTURE  LIPASE, BLOOD  CBC  RPR  HIV ANTIBODY (ROUTINE TESTING)  I-STAT BETA HCG BLOOD, ED ([facility], [facility], [facility] ONLY)  GC/CHLAMYDIA PROBE AMP () NOT AT [facility]    EKG  EKG Interpretation None       Radiology No results found.  Procedures Procedures (including critical care  time)  Medications Ordered in ED Medications  cefTRIAXone (ROCEPHIN) injection 250 mg (not administered)  metroNIDAZOLE (FLAGYL) tablet 2,000 mg (not administered)  azithromycin (ZITHROMAX) tablet 1,000 mg (not administered)  ondansetron (ZOFRAN) injection 4 mg (not administered)  sodium chloride 0.9 % bolus 1,000 mL (0 mLs Intravenous Stopped 08/12/16 2151)  ondansetron (ZOFRAN) injection 4 mg (4 mg Intravenous Given 08/12/16 2043)     Initial Impression / Assessment and Plan / ED Course  I have reviewed the triage vital signs and the nursing notes.  Pertinent labs & imaging results that were available during my care of the patient were reviewed by me and considered in my medical decision making (see chart for details).    This  is a 41 y.o. Female who presents to the ED complaining of fatigue and bilateral lower abdominal pain ongoing for the past 3 days. Patient reports that her menstrual cycle one week ago and since his had some bilateral lower abdominal cramping. She reports associated fatigue and has been napping more recently over the past week. She reports some intermittent nausea, but none currently. No vomiting or diarrhea. Last BM was today and was normal. Previous abdominal surgical history includes cholecystectomy and C-section. She is sexually active with one partner. She denies vaginal bleeding or discharge. She denies urinary symptoms. On exam the patient is afebrile nontoxic appearing. Her abdomen is soft and she has suprapubic tenderness to palpation on exam. On pelvic exam she has some moderate vaginal discharge with close cervix. No cervical motion tenderness. She does have tenderness to suprapubic region with palpation. Pregnancy test is negative. CMP is remarkable for a glucose of 272. Normal anion gap. Normal liver enzymes. Patient is a diabetic. She reports she's been compliant with her medications recently. No recent changes to her medications. Lipase is within normal  limits. CBC is within normal limits. No leukocytosis.  Urinalysis is nitrite negative, with moderate leukocytes, too numerous to count white blood cells, and rare bacteria. She denies urinary symptoms. Urine sent for culture. Wet prep shows clue cells and too numerous to count white blood cells. After discussion with the patient will go ahead and treat for chlamydia and gonorrhea in the emergency department with Rocephin and azithromycin. 2 g of Flagyl in the emergency department to cover for bacterial vaginosis. While the patient did not have cervical motion tenderness she did have tenderness to suprapubic region on pelvic exam. There is concern for PID in this patient. We'll go ahead and treat the patient for PID with doxycycline at discharge. I did advise that she has pending testing for HIV, syphilis, gonorrhea and chlamydia. I encouraged her to  follow-up on these test results in about 3 days. I also advised needs to follow-up with health department for routine STD recheck in about a week. I discussed safe sex practices. I discussed return precautions. I advised the patient to follow-up with their primary care provider this week. I advised the patient to return to the emergency department with new or worsening symptoms or new concerns. The patient verbalized understanding and agreement with plan.    This patient was discussed with and evaluated by Dr. Adriana Simas who agrees with assessment and plan.   Final Clinical Impressions(s) / ED Diagnoses   Final diagnoses:  Concern about STD in female without diagnosis  Pelvic pain  Nausea  Bacterial vaginosis    New Prescriptions New Prescriptions   DOXYCYCLINE (VIBRAMYCIN) 100 MG CAPSULE    Take 1 capsule (100 mg total) by mouth 2 (two) times daily.     Everlene Farrier, PA-C 08/12/16 2347    Donnetta Hutching, MD 08/13/16 [phone]

## 2016-08-12 NOTE — ED Triage Notes (Signed)
Pt reports fatigue and not feeling well x 1 week. Having lower abd pain. Denies urinary symptoms. Denies n/v/d.

## 2016-08-12 NOTE — ED Notes (Signed)
Pt verbalized understanding discharge instructions and denies any further needs or questions at this time. VS stable, ambulatory and steady gait.   

## 2016-08-14 LAB — URINE CULTURE: Culture: >=100000 — AB

## 2016-08-15 ENCOUNTER — Telehealth: Payer: Self-pay | Admitting: Emergency Medicine

## 2016-08-15 LAB — GC/CHLAMYDIA PROBE AMP (~~LOC~~) NOT AT ARMC
Chlamydia: NEGATIVE
Neisseria Gonorrhea: POSITIVE — AB

## 2016-08-15 NOTE — Telephone Encounter (Signed)
Post ED Visit - Positive Culture Follow-up  Culture report reviewed by antimicrobial stewardship pharmacist:  []  Enzo BiNathan Batchelder, Pharm.D. []  Celedonio MiyamotoJeremy Frens, Pharm.D., BCPS AQ-ID []  Garvin FilaMike Maccia, Pharm.D., BCPS []  Georgina PillionElizabeth Martin, Pharm.D., BCPS []  Brimhall NizhoniMinh Pham, [address]Pharm.D., BCPS, AAHIVP []  Estella HuskMichelle Turner, Pharm.D., BCPS, AAHIVP []  Lysle Pearlachel Rumbarger, PharmD, BCPS []  Casilda Carlsaylor Stone, PharmD, BCPS []  Pollyann SamplesAndy Johnston, PharmD, BCPS Sharin MonsEmily Sinclair PharmD  Positive urine culture Treated with doxycycline, organism sensitive to the same and no further patient follow-up is required at this time.  Berle MullMiller, Brooke Payes 08/15/2016, 11:09 AM

## 2016-08-16 LAB — RPR: RPR Ser Ql: NONREACTIVE

## 2016-08-17 LAB — HIV ANTIBODY (ROUTINE TESTING W REFLEX): HIV SCREEN 4TH GENERATION: NONREACTIVE

## 2016-08-31 ENCOUNTER — Encounter: Payer: PRIVATE HEALTH INSURANCE | Admitting: Obstetrics and Gynecology

## 2017-08-10 ENCOUNTER — Other Ambulatory Visit: Payer: Self-pay

## 2017-08-10 ENCOUNTER — Encounter (HOSPITAL_COMMUNITY): Payer: Self-pay

## 2017-08-10 ENCOUNTER — Emergency Department (HOSPITAL_COMMUNITY): Payer: Medicaid Other

## 2017-08-10 ENCOUNTER — Emergency Department (HOSPITAL_COMMUNITY)
Admission: EM | Admit: 2017-08-10 | Discharge: 2017-08-10 | Disposition: A | Discharge status: Home / Self Care | Payer: Medicaid Other | Attending: Emergency Medicine | Admitting: Emergency Medicine

## 2017-08-10 DIAGNOSIS — I10 Essential (primary) hypertension: Secondary | ICD-10-CM | POA: Insufficient documentation

## 2017-08-10 DIAGNOSIS — R0789 Other chest pain: Secondary | ICD-10-CM | POA: Diagnosis present

## 2017-08-10 DIAGNOSIS — J9801 Acute bronchospasm: Secondary | ICD-10-CM | POA: Diagnosis not present

## 2017-08-10 DIAGNOSIS — R079 Chest pain, unspecified: Secondary | ICD-10-CM | POA: Diagnosis not present

## 2017-08-10 DIAGNOSIS — R071 Chest pain on breathing: Secondary | ICD-10-CM | POA: Diagnosis not present

## 2017-08-10 DIAGNOSIS — E119 Type 2 diabetes mellitus without complications: Secondary | ICD-10-CM | POA: Insufficient documentation

## 2017-08-10 LAB — CBC
HEMATOCRIT: 42.1 % (ref 36.0–46.0)
HEMOGLOBIN: 13.8 g/dL (ref 12.0–15.0)
MCH: 27 pg (ref 26.0–34.0)
MCHC: 32.8 g/dL (ref 30.0–36.0)
MCV: 82.2 fL (ref 78.0–100.0)
Platelets: 222 10*3/uL (ref 150–400)
RBC: 5.12 MIL/uL — AB (ref 3.87–5.11)
RDW: 14.1 % (ref 11.5–15.5)
WBC: 5.5 10*3/uL (ref 4.0–10.5)

## 2017-08-10 LAB — BASIC METABOLIC PANEL
ANION GAP: 11 (ref 5–15)
BUN: 6 mg/dL (ref 6–20)
CALCIUM: 9 mg/dL (ref 8.9–10.3)
CO2: 21 mmol/L — ABNORMAL LOW (ref 22–32)
Chloride: 101 mmol/L (ref 98–111)
Creatinine, Ser: 0.96 mg/dL (ref 0.44–1.00)
GLUCOSE: 376 mg/dL — AB (ref 70–99)
POTASSIUM: 3.9 mmol/L (ref 3.5–5.1)
Sodium: 133 mmol/L — ABNORMAL LOW (ref 135–145)

## 2017-08-10 LAB — I-STAT TROPONIN, ED
TROPONIN I, POC: 0 ng/mL (ref 0.00–0.08)
TROPONIN I, POC: 0 ng/mL (ref 0.00–0.08)

## 2017-08-10 LAB — I-STAT BETA HCG BLOOD, ED (MC, WL, AP ONLY)

## 2017-08-10 LAB — D-DIMER, QUANTITATIVE: D-Dimer, Quant: 0.33 ug/mL-FEU (ref 0.00–0.50)

## 2017-08-10 MED ORDER — IBUPROFEN 600 MG PO TABS: 600.0000 mg | ORAL_TABLET | Freq: Four times a day (QID) | ORAL | 0 refills | Status: DC | PRN

## 2017-08-10 MED ORDER — ALBUTEROL SULFATE (2.5 MG/3ML) 0.083% IN NEBU
5.0000 mg | INHALATION_SOLUTION | Freq: Once | RESPIRATORY_TRACT | Status: AC
  Administered 2017-08-10: 5 mg via RESPIRATORY_TRACT
  Filled 2017-08-10: qty 6

## 2017-08-10 MED ORDER — ALBUTEROL SULFATE HFA 108 (90 BASE) MCG/ACT IN AERS
1.0000 | INHALATION_SPRAY | RESPIRATORY_TRACT | Status: DC | PRN
  Administered 2017-08-10: 2 via RESPIRATORY_TRACT
  Filled 2017-08-10: qty 6.7

## 2017-08-10 MED ORDER — PREDNISONE 20 MG PO TABS: ORAL_TABLET | ORAL | 0 refills | Status: DC

## 2017-08-10 NOTE — ED Triage Notes (Signed)
Pt states that she is having CP that started yesterday while sitting. C/O pain in right chest with no radiation. Pt reports she was nauseated when the pain started.

## 2017-08-10 NOTE — ED Notes (Signed)
Lab called to add on d dimer

## 2017-08-10 NOTE — ED Provider Notes (Signed)
[facility] Provider Note   CSN: [id number] Arrival date & time: 08/10/17  1352     History   Chief Complaint Chief Complaint  Patient presents with  . Chest Pain    HPI April Byrd is a 42 y.o. female.  HPI Presents with 2 days of right-sided chest pain worse with deep breathing.  No definite shortness of breath.  No cough, fever or chills.  No new lower extremity swelling or pain.  No previously similar symptoms.  Patient has no history of coronary artery disease and no family history of the same. Past Medical History:  Diagnosis Date  . Asthma    sports induced  . BMI 37.0-37.9,adult 04/26/2012  . Calculus of gallbladder with acute cholecystitis, without mention of obstruction 04/26/2012  . Diabetes mellitus   . Hypertension   . Type II or unspecified type diabetes mellitus without mention of complication, not stated as uncontrolled 04/26/2012  . Unspecified asthma(493.90) 04/26/2012  . Unspecified essential hypertension 04/26/2012    Patient Active Problem List   Diagnosis Date Noted  . Calculus of gallbladder with acute cholecystitis, without mention of obstruction 04/26/2012  . Type II or unspecified type diabetes mellitus without mention of complication, not stated as uncontrolled 04/26/2012  . Unspecified essential hypertension 04/26/2012  . Unspecified asthma(493.90) 04/26/2012  . BMI 37.0-37.9,adult 04/26/2012    Past Surgical History:  Procedure Laterality Date  . CESAREAN SECTION     x 3  . CHOLECYSTECTOMY N/A 04/25/2012   Procedure: LAPAROSCOPIC CHOLECYSTECTOMY WITH INTRAOPERATIVE CHOLANGIOGRAM;  Surgeon: Mariella SaaBenjamin T Hoxworth, MD;  Location: [facility];  Service: General;  Laterality: N/A;  . hydrocephalus surgery    . TUBAL LIGATION       OB History   None      Home Medications    Prior to Admission medications   Medication Sig Start Date End Date Taking? Authorizing Provider  Aspirin-Acetaminophen (GOODYS BODY PAIN  PO) Take 1 each by mouth daily as needed (pain).    Yes [provider]  acetaminophen (TYLENOL) 325 MG tablet Do not take more than 4000 mg of tylenol (acetaminophen) in any 24 hour period.  This is also in your prescribed pain medicine. Patient not taking: Reported on 08/10/2017 04/26/12   Sherrie GeorgeJennings, Willard, PA-C  doxycycline (VIBRAMYCIN) 100 MG capsule Take 1 capsule (100 mg total) by mouth 2 (two) times daily. Patient not taking: Reported on 08/10/2017 08/12/16   Everlene Farrieransie, William, PA-C  ibuprofen (ADVIL,MOTRIN) 600 MG tablet Take 1 tablet (600 mg total) by mouth every 6 (six) hours as needed. 08/10/17   Loren RacerYelverton, Marah Park, MD  oxyCODONE-acetaminophen (PERCOCET/ROXICET) 5-325 MG per tablet Take 1 tablet by mouth every 4 (four) hours as needed for severe pain. Patient not taking: Reported on 08/10/2017 01/18/14   Azalia Bilisampos, Kevin, MD  predniSONE (DELTASONE) 20 MG tablet 3 tabs po day one, then 2 po daily x 4 days 08/10/17   Loren RacerYelverton, Cordale Manera, MD    Family History Family History  Problem Relation Age of Onset  . Diabetes Mother   . Hypertension Mother     Social History Social History   Tobacco Use  . Smoking status: Never Smoker  . Smokeless tobacco: Never Used  Substance Use Topics  . Alcohol use: No  . Drug use: No     Allergies   Penicillins   Review of Systems Review of Systems  Constitutional: Negative for chills and fever.  HENT: Negative for trouble swallowing.   Respiratory: Negative for  cough and shortness of breath.   Cardiovascular: Positive for chest pain. Negative for palpitations and leg swelling.  Gastrointestinal: Negative for abdominal pain, diarrhea, nausea and vomiting.  Genitourinary: Negative for dysuria, flank pain and frequency.  Musculoskeletal: Negative for back pain and neck pain.  Skin: Negative for rash and wound.  Neurological: Negative for dizziness, weakness, light-headedness, numbness and headaches.  All other systems reviewed and are  negative.    Physical Exam Updated Vital Signs BP (!) 127/96 (BP Location: Right Arm)   Pulse 94   Temp 98 F (36.7 C) (Oral)   Resp 18   Ht 5\' 4"  (1.626 m)   Wt 97.5 kg (215 lb)   LMP 07/27/2017   SpO2 100%   BMI 36.90 kg/m   Physical Exam  Constitutional: She is oriented to person, place, and time. She appears well-developed and well-nourished.  HENT:  Head: Normocephalic and atraumatic.  Mouth/Throat: Oropharynx is clear and moist. No oropharyngeal exudate.  Eyes: Pupils are equal, round, and reactive to light. EOM are normal.  Neck: Normal range of motion. Neck supple. No JVD present.  Cardiovascular: Normal rate and regular rhythm. Exam reveals no gallop and no friction rub.  No murmur heard. Pulmonary/Chest: Effort normal. No stridor. No respiratory distress. She has no wheezes. She has no rales. She exhibits no tenderness.  Diminished breath sounds especially in the right base.  Abdominal: Soft. Bowel sounds are normal. There is no tenderness. There is no rebound and no guarding.  Musculoskeletal: Normal range of motion. She exhibits no edema or tenderness.  No lower extremity swelling, asymmetry or tenderness.  Lymphadenopathy:    She has no cervical adenopathy.  Neurological: She is alert and oriented to person, place, and time.  Moves all extremity's without focal deficit.  Sensation intact.  Skin: Skin is warm and dry. Capillary refill takes less than 2 seconds. No rash noted. No erythema.  Psychiatric: She has a normal mood and affect. Her behavior is normal.  Nursing note and vitals reviewed.    ED Treatments / Results  Labs (all labs ordered are listed, but only abnormal results are displayed) Labs Reviewed  BASIC METABOLIC PANEL - Abnormal; Notable for the following components:      Result Value   Sodium 133 (*)    CO2 21 (*)    Glucose, Bld 376 (*)    All other components within normal limits  CBC - Abnormal; Notable for the following components:    RBC 5.12 (*)    All other components within normal limits  D-DIMER, QUANTITATIVE (NOT AT [facility])  I-STAT TROPONIN, ED  I-STAT BETA HCG BLOOD, ED ([facility], [facility], [facility] ONLY)  I-STAT TROPONIN, ED    EKG EKG Interpretation  Date/Time:  Thursday August 10 2017 14:05:03 EDT Ventricular Rate:  100 PR Interval:  156 QRS Duration: 74 QT Interval:  334 QTC Calculation: 430 R Axis:   52 Text Interpretation:  Normal sinus rhythm Possible Anterior infarct , age undetermined Abnormal ECG Confirmed by Loren Racer ([id number]) on 08/10/2017 5:14:26 PM   Radiology Dg Chest 2 View  Result Date: 08/10/2017 CLINICAL DATA:  Right side chest pain EXAM: CHEST - 2 VIEW COMPARISON:  None. FINDINGS: Heart and mediastinal contours are within normal limits. No focal opacities or effusions. No acute bony abnormality. IMPRESSION: No active cardiopulmonary disease. Electronically Signed   By: Charlett Nose M.D.   On: 08/10/2017 14:37    Procedures Procedures (including critical care time)  Medications Ordered in ED Medications  albuterol (PROVENTIL HFA;VENTOLIN HFA) 108 (90 Base) MCG/ACT inhaler 1-2 puff (2 puffs Inhalation Given 08/10/17 1938)  albuterol (PROVENTIL) (2.5 MG/3ML) 0.083% nebulizer solution 5 mg (5 mg Nebulization Given 08/10/17 1823)     Initial Impression / Assessment and Plan / ED Course  I have reviewed the triage vital signs and the nursing notes.  Pertinent labs & imaging results that were available during my care of the patient were reviewed by me and considered in my medical decision making (see chart for details).    Patient states she is feeling much better after nebulized treatment.  Symptoms are atypical for coronary artery disease.  EKG without ischemic findings.  Given chest pain is been constant, single troponin sufficient to rule out myocardial infarction.  Patient also has a normal d-dimer.  Suspect symptoms related to bronchospasm.  Will start on short course of steroids and  anti-inflammatory medication.  Return precautions given.   Final Clinical Impressions(s) / ED Diagnoses   Final diagnoses:  Atypical chest pain  Bronchospasm    ED Discharge Orders        Ordered    predniSONE (DELTASONE) 20 MG tablet     08/10/17 1933    ibuprofen (ADVIL,MOTRIN) 600 MG tablet  Every 6 hours PRN     08/10/17 1933       Loren Racer, MD 08/10/17 2129

## 2017-08-10 NOTE — ED Notes (Signed)
PLEASE NOTE: EKG completed but did NOT transfer over; Hard copy in Triage.

## 2017-08-10 NOTE — ED Notes (Signed)
Pt ambulated to the bathroom w/o difficulty 

## 2017-08-10 NOTE — ED Notes (Signed)
ED Provider at bedside. 

## 2017-09-11 ENCOUNTER — Encounter (HOSPITAL_COMMUNITY): Payer: Self-pay | Admitting: Emergency Medicine

## 2017-09-11 ENCOUNTER — Ambulatory Visit (HOSPITAL_COMMUNITY)
Admission: EM | Admit: 2017-09-11 | Discharge: 2017-09-11 | Disposition: A | Discharge status: Home / Self Care | Payer: Medicaid Other | Attending: Family Medicine | Admitting: Family Medicine

## 2017-09-11 ENCOUNTER — Other Ambulatory Visit: Payer: Self-pay

## 2017-09-11 DIAGNOSIS — Z791 Long term (current) use of non-steroidal anti-inflammatories (NSAID): Secondary | ICD-10-CM | POA: Insufficient documentation

## 2017-09-11 DIAGNOSIS — Z7984 Long term (current) use of oral hypoglycemic drugs: Secondary | ICD-10-CM | POA: Insufficient documentation

## 2017-09-11 DIAGNOSIS — Z79891 Long term (current) use of opiate analgesic: Secondary | ICD-10-CM | POA: Insufficient documentation

## 2017-09-11 DIAGNOSIS — Z79899 Other long term (current) drug therapy: Secondary | ICD-10-CM | POA: Insufficient documentation

## 2017-09-11 DIAGNOSIS — Z8249 Family history of ischemic heart disease and other diseases of the circulatory system: Secondary | ICD-10-CM | POA: Diagnosis not present

## 2017-09-11 DIAGNOSIS — Z833 Family history of diabetes mellitus: Secondary | ICD-10-CM | POA: Diagnosis not present

## 2017-09-11 DIAGNOSIS — Z9049 Acquired absence of other specified parts of digestive tract: Secondary | ICD-10-CM | POA: Insufficient documentation

## 2017-09-11 DIAGNOSIS — Z9851 Tubal ligation status: Secondary | ICD-10-CM | POA: Insufficient documentation

## 2017-09-11 DIAGNOSIS — M25532 Pain in left wrist: Secondary | ICD-10-CM | POA: Insufficient documentation

## 2017-09-11 DIAGNOSIS — M25561 Pain in right knee: Secondary | ICD-10-CM | POA: Insufficient documentation

## 2017-09-11 DIAGNOSIS — Z9889 Other specified postprocedural states: Secondary | ICD-10-CM | POA: Insufficient documentation

## 2017-09-11 DIAGNOSIS — M25572 Pain in left ankle and joints of left foot: Secondary | ICD-10-CM | POA: Diagnosis not present

## 2017-09-11 DIAGNOSIS — J45909 Unspecified asthma, uncomplicated: Secondary | ICD-10-CM | POA: Insufficient documentation

## 2017-09-11 DIAGNOSIS — E119 Type 2 diabetes mellitus without complications: Secondary | ICD-10-CM | POA: Diagnosis not present

## 2017-09-11 DIAGNOSIS — I10 Essential (primary) hypertension: Secondary | ICD-10-CM | POA: Insufficient documentation

## 2017-09-11 DIAGNOSIS — Z880 Allergy status to penicillin: Secondary | ICD-10-CM | POA: Insufficient documentation

## 2017-09-11 DIAGNOSIS — M25531 Pain in right wrist: Secondary | ICD-10-CM | POA: Diagnosis not present

## 2017-09-11 DIAGNOSIS — M25571 Pain in right ankle and joints of right foot: Secondary | ICD-10-CM | POA: Diagnosis not present

## 2017-09-11 DIAGNOSIS — M2550 Pain in unspecified joint: Secondary | ICD-10-CM | POA: Diagnosis not present

## 2017-09-11 DIAGNOSIS — Z7982 Long term (current) use of aspirin: Secondary | ICD-10-CM | POA: Insufficient documentation

## 2017-09-11 LAB — C-REACTIVE PROTEIN: CRP: 17.3 mg/dL — ABNORMAL HIGH (ref <1.0)

## 2017-09-11 MED ORDER — PREDNISONE 10 MG (21) PO TBPK: ORAL_TABLET | ORAL | 0 refills | Status: DC

## 2017-09-11 NOTE — ED Triage Notes (Signed)
On Sunday 09/03/17 left wrist swelled and painful.  Then right wrist started hurting and now bilateral ankle /foot swelling.  No history of this.  No rashes

## 2017-09-11 NOTE — ED Provider Notes (Addendum)
[facility]    CSN: [id number] Arrival date & time: 09/11/17  4332     History   Chief Complaint Chief Complaint  Patient presents with  . Joint Swelling    HPI April Byrd is a 42 y.o. female.   Patient is a 42 year old female with past medical history of diabetes, hypertension, asthma.  Patient is here with multiple joint pain and swelling.  This started last Sunday and has gotten increasingly worse.  She has bilateral ankle pain with swelling.  She has bilateral wrist pain with swelling.  She also has right knee pain with swelling.  She has had a hard time ambulating due to the pain.. She has been elevating her legs all week with no relief.  She is also been using Bayer back and body.  She denies any injuries, rashes, fever.  Denies any history of similar symptoms.  Denies any family history of RA, OA.   ROS per HPI      Past Medical History:  Diagnosis Date  . Asthma    sports induced  . BMI 37.0-37.9,adult 04/26/2012  . Calculus of gallbladder with acute cholecystitis, without mention of obstruction 04/26/2012  . Diabetes mellitus   . Hypertension   . Type II or unspecified type diabetes mellitus without mention of complication, not stated as uncontrolled 04/26/2012  . Unspecified asthma(493.90) 04/26/2012  . Unspecified essential hypertension 04/26/2012    Patient Active Problem List   Diagnosis Date Noted  . Calculus of gallbladder with acute cholecystitis, without mention of obstruction 04/26/2012  . Type II or unspecified type diabetes mellitus without mention of complication, not stated as uncontrolled 04/26/2012  . Unspecified essential hypertension 04/26/2012  . Unspecified asthma(493.90) 04/26/2012  . BMI 37.0-37.9,adult 04/26/2012    Past Surgical History:  Procedure Laterality Date  . CESAREAN SECTION     x 3  . CHOLECYSTECTOMY N/A 04/25/2012   Procedure: LAPAROSCOPIC CHOLECYSTECTOMY WITH INTRAOPERATIVE CHOLANGIOGRAM;  Surgeon: Edward Jolly, MD;  Location: [facility];  Service: General;  Laterality: N/A;  . hydrocephalus surgery    . TUBAL LIGATION      OB History   None      Home Medications    Prior to Admission medications   Medication Sig Start Date End Date Taking? Authorizing Provider  GLIPIZIDE PO Take by mouth.   Yes [provider]  metFORMIN (GLUCOPHAGE) 1000 MG tablet Take 1,000 mg by mouth 2 (two) times daily with a meal.   Yes [provider]  acetaminophen (TYLENOL) 325 MG tablet Do not take more than 4000 mg of tylenol (acetaminophen) in any 24 hour period.  This is also in your prescribed pain medicine. Patient not taking: Reported on 08/10/2017 04/26/12   Earnstine Regal, PA-C  Aspirin-Acetaminophen (GOODYS BODY PAIN PO) Take 1 each by mouth daily as needed (pain).     [provider]  doxycycline (VIBRAMYCIN) 100 MG capsule Take 1 capsule (100 mg total) by mouth 2 (two) times daily. Patient not taking: Reported on 08/10/2017 08/12/16   Waynetta Pean, PA-C  ibuprofen (ADVIL,MOTRIN) 600 MG tablet Take 1 tablet (600 mg total) by mouth every 6 (six) hours as needed. 08/10/17   Julianne Rice, MD  oxyCODONE-acetaminophen (PERCOCET/ROXICET) 5-325 MG per tablet Take 1 tablet by mouth every 4 (four) hours as needed for severe pain. Patient not taking: Reported on 08/10/2017 01/18/14   Jola Schmidt, MD  predniSONE (STERAPRED UNI-PAK 21 TAB) 10 MG (21) TBPK tablet 6 tabs for 1 day, then  5 tabs for 1 das, then 4 tabs for 1 day, then 3 tabs for 1 day, 2 tabs for 1 day, then 1 tab for 1 day 09/11/17   Orvan July, NP    Family History Family History  Problem Relation Age of Onset  . Diabetes Mother   . Hypertension Mother     Social History Social History   Tobacco Use  . Smoking status: Never Smoker  . Smokeless tobacco: Never Used  Substance Use Topics  . Alcohol use: No  . Drug use: No     Allergies   Penicillins   Review of Systems Review of  Systems   Physical Exam Triage Vital Signs ED Triage Vitals  Enc Vitals Group     BP 09/11/17 0903 (!) 127/91     Pulse Rate 09/11/17 0903 98     Resp 09/11/17 0903 18     Temp 09/11/17 0903 98.2 F (36.8 C)     Temp Source 09/11/17 0903 Oral     SpO2 09/11/17 0903 100 %     Weight --      Height --      Head Circumference --      Peak Flow --      Pain Score 09/11/17 0900 9     Pain Loc --      Pain Edu? --      Excl. in [place]? --    No data found.  Updated Vital Signs BP (!) 127/91 (BP Location: Right Arm)   Pulse 98   Temp 98.2 F (36.8 C) (Oral)   Resp 18   LMP 09/11/2017   SpO2 100%   Visual Acuity Right Eye Distance:   Left Eye Distance:   Bilateral Distance:    Right Eye Near:   Left Eye Near:    Bilateral Near:     Physical Exam  Constitutional: She is oriented to person, place, and time. She appears well-developed and well-nourished.  Neck: Normal range of motion.  Pulmonary/Chest: Effort normal.  Musculoskeletal:  Swelling tenderness and erythema to bilateral wrists, ankles and right knee. Limited ROM due to the pain.   Neurological: She is alert and oriented to person, place, and time.  Skin: Skin is warm and dry.  Psychiatric: She has a normal mood and affect.  Nursing note and vitals reviewed.    UC Treatments / Results  Labs (all labs ordered are listed, but only abnormal results are displayed) Labs Reviewed  C-REACTIVE PROTEIN  SEDIMENTATION RATE  RHEUMATOID FACTOR    EKG None  Radiology No results found.  Procedures Procedures (including critical care time)  Medications Ordered in UC Medications - No data to display  Initial Impression / Assessment and Plan / UC Course  I have reviewed the triage vital signs and the nursing notes.  Pertinent labs & imaging results that were available during my care of the patient were reviewed by me and considered in my medical decision making (see chart for details).     Concern for RA,  lupus based on inflammatory polyarthrtis and swelling in multiple joints- will check RF, ESR and CRP Lab results pending.  Will treat with prednisone taper in the mean time. This may increase her blood sugars.  If lab results positive she is aware that she will need Rheumatoid follow up.   CRP and RF elevated.  Spoke with pt about lab results. She is feeling some relief from the prednisone.  She was instructed to follow up  with Rheumatology.  Pt agreeable to plan.  Final Clinical Impressions(s) / UC Diagnoses   Final diagnoses:  Polyarthralgia     Discharge Instructions     It was nice meeting you!!  Your lab results are pending.  We will call you with the results.  We will start a prednisone taper in the mean time to hopefully help  With the pain and inflammation.  Be aware this will increase your sugars so make sure you are monitoring that.      ED Prescriptions    Medication Sig Dispense Auth. Provider   predniSONE (STERAPRED UNI-PAK 21 TAB) 10 MG (21) TBPK tablet 6 tabs for 1 day, then 5 tabs for 1 das, then 4 tabs for 1 day, then 3 tabs for 1 day, 2 tabs for 1 day, then 1 tab for 1 day 21 tablet Rozanna Box, Jourdan Maldonado A, NP     Controlled Substance Prescriptions [place] Controlled Substance Registry consulted? Not Applicable   Orvan July, NP 09/11/17 1023    Loura Halt A, NP 09/12/17 1732

## 2017-09-11 NOTE — Discharge Instructions (Addendum)
It was nice meeting you!!  Your lab results are pending.  We will call you with the results.  We will start a prednisone taper in the mean time to hopefully help  With the pain and inflammation.  Be aware this will increase your sugars so make sure you are monitoring that.

## 2017-09-12 LAB — RHEUMATOID FACTOR: Rhuematoid fact SerPl-aCnc: 14 IU/mL — ABNORMAL HIGH (ref 0.0–13.9)

## 2017-12-11 ENCOUNTER — Encounter: Payer: Self-pay | Admitting: Family Medicine

## 2017-12-11 ENCOUNTER — Ambulatory Visit: Payer: Medicaid Other | Attending: Family Medicine | Admitting: Family Medicine

## 2017-12-11 VITALS — BP 119/80 | HR 87 | Temp 98.8°F | Resp 18 | Ht 64.0 in | Wt 222.0 lb

## 2017-12-11 DIAGNOSIS — Z9049 Acquired absence of other specified parts of digestive tract: Secondary | ICD-10-CM | POA: Insufficient documentation

## 2017-12-11 DIAGNOSIS — Z833 Family history of diabetes mellitus: Secondary | ICD-10-CM | POA: Insufficient documentation

## 2017-12-11 DIAGNOSIS — L308 Other specified dermatitis: Secondary | ICD-10-CM

## 2017-12-11 DIAGNOSIS — M064 Inflammatory polyarthropathy: Secondary | ICD-10-CM | POA: Insufficient documentation

## 2017-12-11 DIAGNOSIS — R7982 Elevated C-reactive protein (CRP): Secondary | ICD-10-CM | POA: Insufficient documentation

## 2017-12-11 DIAGNOSIS — L309 Dermatitis, unspecified: Secondary | ICD-10-CM | POA: Diagnosis not present

## 2017-12-11 DIAGNOSIS — J45990 Exercise induced bronchospasm: Secondary | ICD-10-CM

## 2017-12-11 DIAGNOSIS — R768 Other specified abnormal immunological findings in serum: Secondary | ICD-10-CM

## 2017-12-11 DIAGNOSIS — I10 Essential (primary) hypertension: Secondary | ICD-10-CM | POA: Insufficient documentation

## 2017-12-11 DIAGNOSIS — Z880 Allergy status to penicillin: Secondary | ICD-10-CM | POA: Diagnosis not present

## 2017-12-11 DIAGNOSIS — Z8249 Family history of ischemic heart disease and other diseases of the circulatory system: Secondary | ICD-10-CM | POA: Insufficient documentation

## 2017-12-11 DIAGNOSIS — E1165 Type 2 diabetes mellitus with hyperglycemia: Secondary | ICD-10-CM

## 2017-12-11 DIAGNOSIS — R7689 Other specified abnormal immunological findings in serum: Secondary | ICD-10-CM

## 2017-12-11 DIAGNOSIS — E119 Type 2 diabetes mellitus without complications: Secondary | ICD-10-CM

## 2017-12-11 LAB — GLUCOSE, POCT (MANUAL RESULT ENTRY): POC Glucose: 313 mg/dL — AB (ref 70–99)

## 2017-12-11 LAB — POCT GLYCOSYLATED HEMOGLOBIN (HGB A1C): HbA1c, POC (controlled diabetic range): 12.5 % — AB (ref 0.0–7.0)

## 2017-12-11 MED ORDER — GLIMEPIRIDE 4 MG PO TABS: 4.0000 mg | ORAL_TABLET | Freq: Every day | ORAL | 1 refills | Status: DC

## 2017-12-11 MED ORDER — METFORMIN HCL 500 MG PO TABS: ORAL_TABLET | ORAL | 1 refills | Status: DC

## 2017-12-11 MED ORDER — ALBUTEROL SULFATE HFA 108 (90 BASE) MCG/ACT IN AERS: 2.0000 | INHALATION_SPRAY | Freq: Four times a day (QID) | RESPIRATORY_TRACT | 2 refills | Status: DC | PRN

## 2017-12-11 NOTE — Progress Notes (Signed)
Subjective:    Patient ID: April Byrd, female    DOB: Feb 08, 1975, 42 y.o.   MRN: [id number]  HPI       42 year old female new to the practice.  Per chart review, patient with history of diabetes, hypertension and asthma.  Patient was also recently seen at [facility] urgent care on 09/11/2017 secondary to complaint of multiple joint pain and swelling.  Patient with concern for RA and lupus based on inflammatory polyarthritis and swelling in multiple joints in her visit.  Patient was placed on prednisone taper.  Patient did have rheumatoid factor of 14.0.  Patient C-reactive protein was elevated at 17.3.      Patient reports that she has past medical history significant for type 2 diabetes, exercise-induced asthma and did have recent episode of joint pain and swelling which is now resolved after the use of prednisone.  Patient reports that she does not have any current joint pain, joint swelling or stiffness.  Patient states that the joint pain and swelling for which she was seen at urgent care lasted for about 2 weeks then resolved.  Patient states that before the episode of joint pain and swelling for which she was seen at urgent care, patient had an episode of hand stiffness a few months prior and felt as if she could not bend her fingers.  Patient states that she was notified by the urgent care that she had abnormal labs and needed to follow-up with rheumatology however patient states that when she contacted rheumatology she was told that she could not be seen without a referral and when she contacted urgent care again they told her that they could not provide a referral and that patient would need to establish with a primary care provider.  Patient reports that she has not had a primary care provider in over a year due to lack of insurance.  Patient however now has Medicaid and patient would like a referral to a rheumatologist in follow-up of her joint pain, endocrinology referral as patient states that  she would like a monitor that does not require her to do fingersticks as she does not like sticking herself to check her blood sugar and patient states that she has noticed some areas of dry flaky skin on her face and would like a referral to dermatology.      Patient states that she has not been on medication in over a year for treatment of her diabetes.  Patient has also not monitored her blood sugars during this time.  Patient states that she was previously on metformin and had been on a dose as high as 1000 mg twice daily but states that her doctor allowed her to decrease to 500 mg twice daily.  Patient reports that she was also on Tresiba 22 units once daily.  Patient states that she feels that she does best on insulin.  Patient denies any visual disturbance.  Patient denies current urinary frequency but states that this sometimes occurs.  Patient denies any increased thirst.  Patient reports no numbness or tingling in her hands or feet.      Patient reports that she has only ever had issues in the past with asthma related to exercise.  Patient states that when she recently rejoined a gym a few years ago, she was given a prescription for Proventil to use as needed.  Patient states that otherwise she does not have any shortness of breath, wheezing, chest tightness and does not have any  of these symptoms which bother her sleep at night.  Patient has not used albuterol in over a year due to lack of insurance and medical follow-up.      Patient reports that she has a family history significant for mother having type 2 diabetes and hypertension.  Patient believes that her father may have died from cirrhosis of the liver.  Patient reports surgical history significant for hydrocephalus with placement of a shot until she was about 42 years old.  Patient has had a C-section x3.  Patient has had tubal ligation and patient reports tonsillectomy and removal of adenoids in childhood.  Patient has also had removal of her  gallbladder.  Patient reports her only known allergy is penicillin and she does not know what type of reaction because she was told that she was allergic in childhood and she has never taken the medication.  Patient social history is significant for being divorced.  Patient does not drink or smoke. Past Medical History:  Diagnosis Date  . Asthma    sports induced  . BMI 37.0-37.9,adult 04/26/2012  . Calculus of gallbladder with acute cholecystitis, without mention of obstruction 04/26/2012  . Diabetes mellitus   . Hypertension   . Type II or unspecified type diabetes mellitus without mention of complication, not stated as uncontrolled 04/26/2012  . Unspecified asthma(493.90) 04/26/2012  . Unspecified essential hypertension 04/26/2012   Past Surgical History:  Procedure Laterality Date  . CESAREAN SECTION     x 3  . CHOLECYSTECTOMY N/A 04/25/2012   Procedure: LAPAROSCOPIC CHOLECYSTECTOMY WITH INTRAOPERATIVE CHOLANGIOGRAM;  Surgeon: Mariella SaaBenjamin T Hoxworth, MD;  Location: [facility];  Service: General;  Laterality: N/A;  . hydrocephalus surgery    . TUBAL LIGATION     Family History  Problem Relation Age of Onset  . Diabetes Mother   . Hypertension Mother    Social History   Tobacco Use  . Smoking status: Never Smoker  . Smokeless tobacco: Never Used  Substance Use Topics  . Alcohol use: No  . Drug use: No   Allergies  Allergen Reactions  . Penicillins Other (See Comments)    Has patient had a PCN reaction causing immediate rash, facial/tongue/throat swelling, SOB or lightheadedness with hypotension: Unknown Has patient had a PCN reaction causing severe rash involving mucus membranes or skin necrosis: Unknown Has patient had a PCN reaction that required hospitalization: Unknown Has patient had a PCN reaction occurring within the last 10 years: No If all of the above answers are "NO", then may proceed with Cephalosporin use.       Review of Systems  Constitutional: Positive for fatigue.  Negative for chills and fever.  HENT: Negative for congestion, dental problem, sore throat and trouble swallowing.   Eyes: Negative for photophobia and visual disturbance.  Respiratory: Negative for cough, chest tightness, shortness of breath and wheezing.   Cardiovascular: Negative for chest pain, palpitations and leg swelling.  Gastrointestinal: Negative for abdominal pain and nausea.  Endocrine: Negative for polydipsia, polyphagia and polyuria.  Genitourinary: Negative for dysuria and frequency.  Musculoskeletal: Positive for joint swelling (None currently but has had past swelling of her hands, wrist and knees). Negative for arthralgias, back pain, gait problem and myalgias.  Skin:       Patient reports dry flaky skin on her face and bridge of nose/cheeks  Neurological: Negative for dizziness and headaches.       Objective:   Physical Exam BP 119/80 (BP Location: Left Arm, Patient Position: Sitting, Cuff Size:  Large)   Pulse 87   Temp 98.8 F (37.1 C) (Oral)   Resp 18   Ht 5\' 4"  (1.626 m)   Wt 222 lb (100.7 kg)   LMP 12/08/2017   SpO2 98%   BMI 38.11 kg/m Vital signs and nurse's notes reviewed General-well-nourished, well-developed female in no acute distress ENT- TMs light pink but with visible landmarks, nares with edema/erythema of the nasal turbinates with right-sided vibrant yellow discharge, patient with slightly narrow posterior airway with posterior pharynx erythema Neck-supple, no lymphadenopathy, no thyromegaly, no carotid bruit Skin- facial skin could not be adequately evaluated as patient with make up/foundation Lungs-clear to auscultation bilaterally Cardiovascular-regular rate and rhythm Abdomen-soft, nontender Back-no CVA tenderness Extremities-no edema Diabetic foot exam deferred to endocrinology        Assessment & Plan:  1. Type 2 diabetes mellitus without complication, unspecified whether long term insulin use (HCC) Patient reports that she has not  had any medical care in over a year regarding her diabetes.  Patient will have hemoglobin A1c and glucose level done at today's visit.  Patient reports no complications such as retinopathy or neuropathy as a result of her diabetes. - HgB A1c - Glucose (CBG)  2. Type 2 diabetes mellitus with hyperglycemia, without long-term current use of insulin (HCC) Patient with a blood sugar of 313 status post having coffee with sugar about 2 hours before her appointment.  Patient with hemoglobin A1c of 12.5.  Patient declined prescription for glucometer as patient reports that there is a specific type of monitor which she would like to have that does not require her to perform fingersticks.  Patient will be referred to endocrinology for further evaluation and treatment.  Patient reports that she has taken metformin in the past and prescription provided for patient to start metformin 500 mg 2 pills twice daily.  Patient also given prescription for Amaryl 4 mg each morning which she should take before her first meal of the day.  Patient will have labs done in follow-up of her diabetes including CMP, TSH, urine microalbumin/urinalysis and lipid panel.  Depending on microalbumin results, patient will likely be started on a low-dose ACE inhibitor such as lisinopril 5 mg to help prevent nephropathy.  Patient will also be contacted after the results of the lipid panel are known as she would likely benefit from statin medication to help reduce the risk of CAD associated with diabetes.  Patient provided with information on carbohydrate counting and meal planning - Comprehensive metabolic panel - POCT URINALYSIS DIP (CLINITEK) - TSH - Ambulatory referral to Endocrinology - Microalbumin/Creatinine Ratio, Urine - metFORMIN (GLUCOPHAGE) 500 MG tablet; 2 pills twice per day with a meal  Dispense: 120 tablet; Refill: 1 - glimepiride (AMARYL) 4 MG tablet; Take 1 tablet (4 mg total) by mouth daily before breakfast. Do not take med  unless planning to eat within 30 minutes  Dispense: 30 tablet; Refill: 1  3. Mild exercise-induced asthma Prescription provided for albuterol HFA to take 2 puffs prior to exercise and as needed.  4. Rheumatoid factor positive Patient reports no joint pain at today's visit but patient has had 2 episodes of onset of joint stiffness/joint pain and swelling.  Patient most recently had an episode on 09/11/2017 for which she was seen at [facility] urgent care due to joint pain and swelling which initially started in the left wrist in the right and spread to having joint pain in the knees, hands along with swelling.  Patient did have elevated  CRP and rheumatoid factor level of 14.  Patient had resolution of joint pain with prednisone taper.  Patient is being referred to rheumatology for further evaluation and treatment. - Ambulatory referral to Rheumatology  5. Dermatitis Patient with complaint of a dermatitis on the face but patient was wearing make-up which prevented evaluation.  Patient has been referred to dermatology for further evaluation and treatment - Ambulatory referral to Dermatology  An After Visit Summary was printed and given to the patient.  Return in about 4 weeks (around 01/08/2018) for DM if not seen by endocrinology otherwise 3 months.

## 2017-12-12 LAB — COMPREHENSIVE METABOLIC PANEL WITH GFR
ALT: 12 IU/L (ref 0–32)
AST: 13 IU/L (ref 0–40)
Albumin/Globulin Ratio: 1.3 (ref 1.2–2.2)
Albumin: 4 g/dL (ref 3.5–5.5)
Alkaline Phosphatase: 101 IU/L (ref 39–117)
BUN/Creatinine Ratio: 7 — ABNORMAL LOW (ref 9–23)
BUN: 6 mg/dL (ref 6–24)
Bilirubin Total: 0.5 mg/dL (ref 0.0–1.2)
CO2: 24 mmol/L (ref 20–29)
Calcium: 8.9 mg/dL (ref 8.7–10.2)
Chloride: 97 mmol/L (ref 96–106)
Creatinine, Ser: 0.82 mg/dL (ref 0.57–1.00)
GFR calc Af Amer: 101 mL/min/1.73
GFR calc non Af Amer: 88 mL/min/1.73
Globulin, Total: 3.2 g/dL (ref 1.5–4.5)
Glucose: 354 mg/dL — ABNORMAL HIGH (ref 65–99)
Potassium: 3.9 mmol/L (ref 3.5–5.2)
Sodium: 136 mmol/L (ref 134–144)
Total Protein: 7.2 g/dL (ref 6.0–8.5)

## 2017-12-12 LAB — MICROALBUMIN / CREATININE URINE RATIO
Creatinine, Urine: 64.1 mg/dL
Microalb/Creat Ratio: 142.7 mg/g{creat} — ABNORMAL HIGH (ref 0.0–30.0)
Microalbumin, Urine: 91.5 ug/mL

## 2017-12-12 LAB — TSH: TSH: 0.787 u[IU]/mL (ref 0.450–4.500)

## 2017-12-15 ENCOUNTER — Other Ambulatory Visit: Payer: Self-pay | Admitting: Family Medicine

## 2017-12-15 ENCOUNTER — Telehealth: Payer: Self-pay | Admitting: *Deleted

## 2017-12-15 MED ORDER — GLUCOSE BLOOD VI STRP: ORAL_STRIP | 12 refills | Status: DC

## 2017-12-15 MED ORDER — ACCU-CHEK AVIVA PLUS W/DEVICE KIT: 1.0000 | PACK | Freq: Three times a day (TID) | 0 refills | Status: DC

## 2017-12-15 MED ORDER — ACCU-CHEK SOFT TOUCH LANCETS MISC: 12 refills | Status: DC

## 2017-12-15 NOTE — Telephone Encounter (Signed)
Patient verified DOB Patient is aware of her uncontrolled DM causing elevated kidney function levels and glucose showing high in her blood. Patient is advised to keep appointment with endocrinology and to take her medications as prescribed to get her A1C under 7. Patient did request a meter and MA will send a medicaid approved meter to the pharmacy.

## 2017-12-15 NOTE — Telephone Encounter (Signed)
-----   Message from Cain Saupeammie Fulp, MD sent at 12/13/2017  7:13 PM EST ----- Please notify patient that her complete metabolic panel showed that her glucose was very elevated at 354.  Patient with normal TSH.  Patient with elevated microalbumin/creatinine ratio.  Patient needs to make sure that she follows up with endocrinology and works on getting her hemoglobin A1c to 7 or less to help prevent further long-term complications from the effects of diabetes

## 2017-12-18 MED ORDER — GLUCOSE BLOOD VI STRP: ORAL_STRIP | 12 refills | Status: DC

## 2017-12-18 MED ORDER — ACCU-CHEK FASTCLIX LANCETS MISC: 12 refills | Status: DC

## 2017-12-18 MED ORDER — ACCU-CHEK GUIDE W/DEVICE KIT: 1.0000 | PACK | Freq: Once | 0 refills | Status: AC

## 2018-01-03 ENCOUNTER — Ambulatory Visit: Payer: Medicaid Other | Admitting: Family Medicine

## 2018-01-25 NOTE — Progress Notes (Signed)
Office Visit Note  Patient: April Byrd             Date of Birth: [birth year]           MRN: [id number]             PCP: Cain Saupe, MD Referring: Cain Saupe, MD Visit Date: 02/05/2018 Occupation: Unemployed  Subjective:  Pain and swelling in multiple joints.   History of Present Illness: April Byrd is a 43 y.o. female seen in consultation per request of her PCP.  According to patient about 3 years ago she started having pain in her hands and difficulty making a fist.  She states she did faint to the emergency room where the work-up was negative and she was discharged.  The symptoms lasted for about a week.  She states over a year later she had similar symptoms with pain and discomfort in her hands which resolved by itself after a few days.  In September 2019 she developed swelling in her hands knee joints and ankle joints.  She states that she was evaluated by physicians at urgent care where she had labs and was given prednisone taper for 1 week.  She states symptoms resolved with the prednisone and she had no recurrence of symptoms.  She denies any joint pain currently.  She states she has had ankle joint sprain in the past which hurts off and on.  She denies any family history of autoimmune disease.  She denies any rash.  Activities of Daily Living:  Patient reports morning stiffness for 20 minutes.   Patient Denies nocturnal pain.  Difficulty dressing/grooming: Denies Difficulty climbing stairs: Denies Difficulty getting out of chair: Denies Difficulty using hands for taps, buttons, cutlery, and/or writing: Denies  Review of Systems  Constitutional: Negative for fatigue, night sweats, weight gain and weight loss.  HENT: Positive for mouth dryness. Negative for mouth sores, trouble swallowing, trouble swallowing and nose dryness.   Eyes: Negative for pain, redness, visual disturbance and dryness.  Respiratory: Negative for cough, shortness of breath and difficulty  breathing.   Cardiovascular: Negative for chest pain, palpitations, hypertension, irregular heartbeat and swelling in legs/feet.  Gastrointestinal: Negative for blood in stool, constipation and diarrhea.  Endocrine: Negative for increased urination.  Genitourinary: Negative for vaginal dryness.  Musculoskeletal: Positive for morning stiffness. Negative for arthralgias, joint pain, joint swelling, myalgias, muscle weakness, muscle tenderness and myalgias.  Skin: Positive for hair loss. Negative for color change, rash, skin tightness, ulcers and sensitivity to sunlight.  Allergic/Immunologic: Negative for susceptible to infections.  Neurological: Negative for dizziness, memory loss, night sweats and weakness.  Hematological: Negative for swollen glands.  Psychiatric/Behavioral: Negative for depressed mood and sleep disturbance. The patient is not nervous/anxious.     PMFS History:  Patient Active Problem List   Diagnosis Date Noted  . Calculus of gallbladder with acute cholecystitis 04/26/2012  . Type II or unspecified type diabetes mellitus without mention of complication, not stated as uncontrolled 04/26/2012  . Unspecified essential hypertension 04/26/2012  . Unspecified asthma(493.90) 04/26/2012  . BMI 37.0-37.9,adult 04/26/2012    Past Medical History:  Diagnosis Date  . Asthma    sports induced  . BMI 37.0-37.9,adult 04/26/2012  . Calculus of gallbladder with acute cholecystitis, without mention of obstruction 04/26/2012  . Diabetes mellitus   . Hypertension   . Type II or unspecified type diabetes mellitus without mention of complication, not stated as uncontrolled 04/26/2012  . Unspecified asthma(493.90) 04/26/2012  . Unspecified essential hypertension 04/26/2012  Family History  Problem Relation Age of Onset  . Diabetes Mother   . Hypertension Mother   . Kidney disease Father   . Healthy Brother   . Healthy Son   . Healthy Son   . Healthy Son    Past Surgical History:    Procedure Laterality Date  . CESAREAN SECTION     x 3  . CHOLECYSTECTOMY N/A 04/25/2012   Procedure: LAPAROSCOPIC CHOLECYSTECTOMY WITH INTRAOPERATIVE CHOLANGIOGRAM;  Surgeon: Mariella Saa, MD;  Location: [facility];  Service: General;  Laterality: N/A;  . hydrocephalus surgery    . TUBAL LIGATION     Social History   Social History Narrative  . Not on file    Objective: Vital Signs: BP 126/89 (BP Location: Right Arm, Patient Position: Sitting, Cuff Size: Normal)   Pulse 92   Resp 14   Ht 5\' 4"  (1.626 m)   Wt 220 lb 9.6 oz (100.1 kg)   BMI 37.87 kg/m    Physical Exam Vitals signs and nursing note reviewed.  Constitutional:      Appearance: She is well-developed.  HENT:     Head: Normocephalic and atraumatic.  Eyes:     Conjunctiva/sclera: Conjunctivae normal.  Neck:     Musculoskeletal: Normal range of motion.  Cardiovascular:     Rate and Rhythm: Normal rate and regular rhythm.     Heart sounds: Normal heart sounds.  Pulmonary:     Effort: Pulmonary effort is normal.     Breath sounds: Normal breath sounds.  Abdominal:     General: Bowel sounds are normal.     Palpations: Abdomen is soft.  Lymphadenopathy:     Cervical: No cervical adenopathy.  Skin:    General: Skin is warm and dry.     Capillary Refill: Capillary refill takes less than 2 seconds.     Findings: Erythema present.     Comments: Patient has erythematous rash around her mouth and nasolabial folds.  Neurological:     Mental Status: She is alert and oriented to person, place, and time.  Psychiatric:        Behavior: Behavior normal.      Musculoskeletal Exam: C-spine thoracic lumbar spine good range of motion.  Shoulder joints elbow joints wrist joint MCPs PIPs DIPs been good range of motion with no synovitis.  Hip joints knee joints ankles MTPs PIPs been good range of motion with no synovitis.  CDAI Exam: CDAI Score: Not documented Patient Global Assessment: Not documented; Provider Global  Assessment: Not documented Swollen: Not documented; Tender: Not documented Joint Exam   Not documented   There is currently no information documented on the homunculus. Go to the Rheumatology activity and complete the homunculus joint exam.  Investigation: Findings:  09/11/17: CRP 17.3, RF 14 12/01/17: TSH 0.787  Component     Latest Ref Rng & Units 09/11/2017  CRP     <1.0 mg/dL 03.8 (H)  RA Latex Turbid.     0.0 - 13.9 IU/mL 14.0 (H)   Imaging: Xr Foot 2 Views Left  Result Date: 02/05/2018 Mild first MTP, PIP and DIP narrowing was noted.  No erosive changes were noted. 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  Xr Foot 2 Views Right  Result Date: 02/05/2018 Mild first MTP, PIP and DIP narrowing was noted.  No erosive changes were noted. 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  Xr Hand 2 View Left  Result Date: 02/05/2018  Mild PIP joint space narrowing was noted.  No intercarpal, radiocarpal or MCP joint narrowing was noted.  No erosive changes were noted. Impression: These findings are consistent with mild osteoarthritis of the hand.  Xr Hand 2 View Right  Result Date: 02/05/2018 PIP narrowing was noted.  No MCP, intercarpal radiocarpal joint space narrowing was noted.  No erosive changes were noted. Impression: These findings are consistent with mild osteoarthritis of the hand.  Xr Knee 3 View Left  Result Date: 02/05/2018 Lateral compartment narrowing was noted.  Mild patellofemoral narrowing was noted.  No chondrocalcinosis was noted. Impression: These findings are consistent with mild osteoarthritis and mild chondromalacia patella.  Xr Knee 3 View Right  Result Date: 02/05/2018 Mild medial compartment narrowing was noted.  No chondrocalcinosis was noted.  Mild patellofemoral narrowing was noted. Impression: These findings are consistent with mild osteoarthritis and mild chondromalacia  patella.   Recent Labs: Lab Results  Component Value Date   WBC 5.5 08/10/2017   HGB 13.8 08/10/2017   PLT 222 08/10/2017   NA 136 12/11/2017   K 3.9 12/11/2017   CL 97 12/11/2017   CO2 24 12/11/2017   GLUCOSE 354 (H) 12/11/2017   BUN 6 12/11/2017   CREATININE 0.82 12/11/2017   BILITOT 0.5 12/11/2017   ALKPHOS 101 12/11/2017   AST 13 12/11/2017   ALT 12 12/11/2017   PROT 7.2 12/11/2017   ALBUMIN 4.0 12/11/2017   CALCIUM 8.9 12/11/2017   GFRAA 101 12/11/2017    Speciality Comments: No specialty comments available.  Procedures:  No procedures performed Allergies: Penicillins   Assessment / Plan:     Visit Diagnoses: Polyarthralgia - 09/11/17: CRP 17.3, RF 1409/09/11: TSH 0.787.  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  She states she has had 3 episodes in the last 3 years.  All the episodes lasted for about a week.  She states these episodes mostly involve her hands knees and her ankles.  She also has feet discomfort at times.  I discussed possibility of palindromic rheumatism or early stages of rheumatoid arthritis.  I will obtain labs today and will discuss treatment plan at follow-up visit.  I have also advised her to call us when she has a flare so she can be seen on an urgent basis.  Pain in both hands - Plan: XR Hand 2 View Right, XR Hand 2 View Left, x-rays were consistent with mild osteoarthritis of bilateral hands.  Sedimentation rate, ANA, Cyclic citrul peptide antibody, IgG, 14-3-3 eta Protein, Uric acid, ANA  Pain in both feet -she is currently not having feet discomfort but she has intermittent discomfort in her feet.  Plan: XR Foot 2 Views Right, XR Foot 2 Views Left.  X-rays were consistent with mild osteoarthritis of feet.  Pain in both knees-patient gives history of intermittent swelling in her knees as well.  No warmth swelling or effusion was noted today..  X-rays revealed mild osteoarthritic changes in  bilateral knee joints.  No chondrocalcinosis was noted.  Rheumatoid factor positive-she has low titer positive rheumatoid factor.  History of type 2 diabetes mellitus-patient states her diabetes is not well controlled despite medications.  History of asthma-she gives history of exercise-induced asthma.  Essential hypertension-her blood pressure was normal.  Other fatigue - Plan: CK, Glucose 6 phosphate dehydrogenase   Orders: Orders Placed This Encounter  Procedures  . XR Hand 2 View Right  . XR Hand 2 View Left  . XR Foot 2 Views Right  .  XR Foot 2 Views Left  . CK  .   Marland Kitchen. Sedimentation rate  . ANA  . Cyclic citrul peptide antibody, IgG  . 14-3-3 eta Protein  . Glucose 6 phosphate dehydrogenase  . Uric acid  . ANA   No orders of the defined types were placed in this encounter.   Face-to-face time spent with patient was 50 minutes. Greater than 50% of time was spent in counseling and coordination of care.  Follow-Up Instructions: Return for Polyarthralgia.   April SavoyShaili Tachina Spoonemore, MD  Note - This record has been created using Animal nutritionistDragon software.  Chart creation errors have been sought, but may not always  have been located. Such creation errors do not reflect on  the standard of medical care.

## 2018-02-05 ENCOUNTER — Ambulatory Visit: Payer: Medicaid Other | Admitting: Rheumatology

## 2018-02-05 ENCOUNTER — Ambulatory Visit (INDEPENDENT_AMBULATORY_CARE_PROVIDER_SITE_OTHER): Payer: Self-pay

## 2018-02-05 ENCOUNTER — Ambulatory Visit (INDEPENDENT_AMBULATORY_CARE_PROVIDER_SITE_OTHER): Payer: Medicaid Other

## 2018-02-05 ENCOUNTER — Encounter: Payer: Self-pay | Admitting: Rheumatology

## 2018-02-05 VITALS — BP 126/89 | HR 92 | Resp 14 | Ht 64.0 in | Wt 220.6 lb

## 2018-02-05 DIAGNOSIS — M79641 Pain in right hand: Secondary | ICD-10-CM

## 2018-02-05 DIAGNOSIS — M79672 Pain in left foot: Secondary | ICD-10-CM | POA: Diagnosis not present

## 2018-02-05 DIAGNOSIS — R5383 Other fatigue: Secondary | ICD-10-CM | POA: Diagnosis not present

## 2018-02-05 DIAGNOSIS — Z8709 Personal history of other diseases of the respiratory system: Secondary | ICD-10-CM | POA: Diagnosis not present

## 2018-02-05 DIAGNOSIS — M79671 Pain in right foot: Secondary | ICD-10-CM

## 2018-02-05 DIAGNOSIS — M2550 Pain in unspecified joint: Secondary | ICD-10-CM | POA: Diagnosis not present

## 2018-02-05 DIAGNOSIS — I10 Essential (primary) hypertension: Secondary | ICD-10-CM | POA: Diagnosis not present

## 2018-02-05 DIAGNOSIS — G8929 Other chronic pain: Secondary | ICD-10-CM | POA: Diagnosis not present

## 2018-02-05 DIAGNOSIS — M25562 Pain in left knee: Secondary | ICD-10-CM

## 2018-02-05 DIAGNOSIS — R768 Other specified abnormal immunological findings in serum: Secondary | ICD-10-CM | POA: Diagnosis not present

## 2018-02-05 DIAGNOSIS — M25561 Pain in right knee: Secondary | ICD-10-CM

## 2018-02-05 DIAGNOSIS — Z8639 Personal history of other endocrine, nutritional and metabolic disease: Secondary | ICD-10-CM | POA: Diagnosis not present

## 2018-02-05 DIAGNOSIS — M79642 Pain in left hand: Secondary | ICD-10-CM

## 2018-02-08 DIAGNOSIS — L819 Disorder of pigmentation, unspecified: Secondary | ICD-10-CM | POA: Diagnosis not present

## 2018-02-08 DIAGNOSIS — L219 Seborrheic dermatitis, unspecified: Secondary | ICD-10-CM | POA: Diagnosis not present

## 2018-02-09 LAB — ANA: Anti Nuclear Antibody(ANA): NEGATIVE

## 2018-02-09 LAB — SEDIMENTATION RATE: Sed Rate: 2 mm/h (ref 0–20)

## 2018-02-09 LAB — CYCLIC CITRUL PEPTIDE ANTIBODY, IGG

## 2018-02-09 LAB — CK: Total CK: 258 U/L — ABNORMAL HIGH (ref 29–143)

## 2018-02-09 LAB — GLUCOSE 6 PHOSPHATE DEHYDROGENASE: G-6PDH: 14.8 U/g{Hb} (ref 7.0–20.5)

## 2018-02-09 LAB — 14-3-3 ETA PROTEIN: 14-3-3 eta Protein: <0.2 ng/mL (ref <0.2)

## 2018-02-09 LAB — URIC ACID: URIC ACID, SERUM: 5 mg/dL (ref 2.5–7.0)

## 2018-02-12 DIAGNOSIS — M19072 Primary osteoarthritis, left ankle and foot: Secondary | ICD-10-CM

## 2018-02-12 DIAGNOSIS — M19041 Primary osteoarthritis, right hand: Secondary | ICD-10-CM | POA: Insufficient documentation

## 2018-02-12 DIAGNOSIS — M170 Bilateral primary osteoarthritis of knee: Secondary | ICD-10-CM | POA: Insufficient documentation

## 2018-02-12 DIAGNOSIS — M19042 Primary osteoarthritis, left hand: Secondary | ICD-10-CM

## 2018-02-12 DIAGNOSIS — M19071 Primary osteoarthritis, right ankle and foot: Secondary | ICD-10-CM | POA: Insufficient documentation

## 2018-02-12 NOTE — Progress Notes (Signed)
Will repeat CK and aldolase at the follow-up visit.

## 2018-02-12 NOTE — Progress Notes (Deleted)
Office Visit Note  Patient: April Byrd             Date of Birth: 30-Sep-1975           MRN: [id number]             PCP: Antony Blackbird, MD Referring: No ref. provider found Visit Date: 02/26/2018 Occupation: '@GUAROCC' @  Subjective:  No chief complaint on file.   History of Present Illness: April Byrd is a 43 y.o. female ***   Activities of Daily Living:  Patient reports morning stiffness for *** {minute/hour:19697}.   Patient {ACTIONS;DENIES/REPORTS:21021675::"Denies"} nocturnal pain.  Difficulty dressing/grooming: {ACTIONS;DENIES/REPORTS:21021675::"Denies"} Difficulty climbing stairs: {ACTIONS;DENIES/REPORTS:21021675::"Denies"} Difficulty getting out of chair: {ACTIONS;DENIES/REPORTS:21021675::"Denies"} Difficulty using hands for taps, buttons, cutlery, and/or writing: {ACTIONS;DENIES/REPORTS:21021675::"Denies"}  No Rheumatology ROS completed.   PMFS History:  Patient Active Problem List   Diagnosis Date Noted  . Calculus of gallbladder with acute cholecystitis 04/26/2012  . Type II or unspecified type diabetes mellitus without mention of complication, not stated as uncontrolled 04/26/2012  . Unspecified essential hypertension 04/26/2012  . Unspecified asthma(493.90) 04/26/2012  . BMI 37.0-37.9,adult 04/26/2012    Past Medical History:  Diagnosis Date  . Asthma    sports induced  . BMI 37.0-37.9,adult 04/26/2012  . Calculus of gallbladder with acute cholecystitis, without mention of obstruction 04/26/2012  . Diabetes mellitus   . Hypertension   . Type II or unspecified type diabetes mellitus without mention of complication, not stated as uncontrolled 04/26/2012  . Unspecified asthma(493.90) 04/26/2012  . Unspecified essential hypertension 04/26/2012    Family History  Problem Relation Age of Onset  . Diabetes Mother   . Hypertension Mother   . Kidney disease Father   . Healthy Brother   . Healthy Son   . Healthy Son   . Healthy Son    Past Surgical History:    Procedure Laterality Date  . CESAREAN SECTION     x 3  . CHOLECYSTECTOMY N/A 04/25/2012   Procedure: LAPAROSCOPIC CHOLECYSTECTOMY WITH INTRAOPERATIVE CHOLANGIOGRAM;  Surgeon: Edward Jolly, MD;  Location: [facility];  Service: General;  Laterality: N/A;  . hydrocephalus surgery    . TUBAL LIGATION     Social History   Social History Narrative  . Not on file    There is no immunization history on file for this patient.   Objective: Vital Signs: There were no vitals taken for this visit.   Physical Exam   Musculoskeletal Exam: ***  CDAI Exam: CDAI Score: Not documented Patient Global Assessment: Not documented; Provider Global Assessment: Not documented Swollen: Not documented; Tender: Not documented Joint Exam   Not documented   There is currently no information documented on the homunculus. Go to the Rheumatology activity and complete the homunculus joint exam.  Investigation: No additional findings.  Imaging: Xr Foot 2 Views Left  Result Date: 02/05/2018 Mild first MTP, PIP and DIP narrowing was noted.  No erosive changes were noted. 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  Xr Foot 2 Views Right  Result Date: 02/05/2018 Mild first MTP, PIP and DIP narrowing was noted.  No erosive changes were noted. 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  Xr Hand 2 View Left  Result Date: 02/05/2018 Mild PIP joint space narrowing was noted.  No intercarpal, radiocarpal or MCP joint narrowing was noted.  No erosive changes were noted. Impression: These findings are consistent with mild osteoarthritis of the hand.  Xr Hand 2 View Right  Result Date: 02/05/2018 PIP narrowing was noted.  No MCP, intercarpal radiocarpal joint space narrowing was noted.  No erosive changes were noted. Impression: These findings are consistent with mild osteoarthritis of the hand.  Xr Knee 3 View Left  Result Date:  02/05/2018 Lateral compartment narrowing was noted.  Mild patellofemoral narrowing was noted.  No chondrocalcinosis was noted. Impression: These findings are consistent with mild osteoarthritis and mild chondromalacia patella.  Xr Knee 3 View Right  Result Date: 02/05/2018 Mild medial compartment narrowing was noted.  No chondrocalcinosis was noted.  Mild patellofemoral narrowing was noted. Impression: These findings are consistent with mild osteoarthritis and mild chondromalacia patella.   Recent Labs: Lab Results  Component Value Date   WBC 5.5 08/10/2017   HGB 13.8 08/10/2017   PLT 222 08/10/2017   NA 136 12/11/2017   K 3.9 12/11/2017   CL 97 12/11/2017   CO2 24 12/11/2017   GLUCOSE 354 (H) 12/11/2017   BUN 6 12/11/2017   CREATININE 0.82 12/11/2017   BILITOT 0.5 12/11/2017   ALKPHOS 101 12/11/2017   AST 13 12/11/2017   ALT 12 12/11/2017   PROT 7.2 12/11/2017   ALBUMIN 4.0 12/11/2017   CALCIUM 8.9 12/11/2017   GFRAA 101 12/11/2017  CK 258, ESR 2, ANA negative, anti-CCP negative, '14 3 3 ' eta negative, G6PD normal, uric acid 5.0 09/11/17: CRP 17.3, RF 14 12/01/17: TSH 0.787  Speciality Comments: No specialty comments available.  Procedures:  No procedures performed Allergies: Penicillins   Assessment / Plan:     Visit Diagnoses: No diagnosis found.   Orders: No orders of the defined types were placed in this encounter.  No orders of the defined types were placed in this encounter.   Face-to-face time spent with patient was *** minutes. Greater than 50% of time was spent in counseling and coordination of care.  Follow-Up Instructions: No follow-ups on file.   Bo Merino, MD  Note - This record has been created using Editor, commissioning.  Chart creation errors have been sought, but may not always  have been located. Such creation errors do not reflect on  the standard of medical care.

## 2018-02-20 ENCOUNTER — Emergency Department (HOSPITAL_COMMUNITY)
Admission: EM | Admit: 2018-02-20 | Discharge: 2018-02-20 | Disposition: A | Discharge status: Home / Self Care | Payer: Medicaid Other | Attending: Emergency Medicine | Admitting: Emergency Medicine

## 2018-02-20 ENCOUNTER — Emergency Department (HOSPITAL_COMMUNITY): Payer: Medicaid Other

## 2018-02-20 DIAGNOSIS — E119 Type 2 diabetes mellitus without complications: Secondary | ICD-10-CM | POA: Insufficient documentation

## 2018-02-20 DIAGNOSIS — J45909 Unspecified asthma, uncomplicated: Secondary | ICD-10-CM | POA: Insufficient documentation

## 2018-02-20 DIAGNOSIS — S0990XA Unspecified injury of head, initial encounter: Secondary | ICD-10-CM | POA: Diagnosis not present

## 2018-02-20 DIAGNOSIS — Y929 Unspecified place or not applicable: Secondary | ICD-10-CM | POA: Diagnosis not present

## 2018-02-20 DIAGNOSIS — I10 Essential (primary) hypertension: Secondary | ICD-10-CM | POA: Diagnosis not present

## 2018-02-20 DIAGNOSIS — Y998 Other external cause status: Secondary | ICD-10-CM | POA: Diagnosis not present

## 2018-02-20 DIAGNOSIS — W228XXA Striking against or struck by other objects, initial encounter: Secondary | ICD-10-CM | POA: Diagnosis not present

## 2018-02-20 DIAGNOSIS — Z7984 Long term (current) use of oral hypoglycemic drugs: Secondary | ICD-10-CM | POA: Insufficient documentation

## 2018-02-20 DIAGNOSIS — Z79899 Other long term (current) drug therapy: Secondary | ICD-10-CM | POA: Diagnosis not present

## 2018-02-20 DIAGNOSIS — R51 Headache: Secondary | ICD-10-CM | POA: Diagnosis not present

## 2018-02-20 DIAGNOSIS — Y939 Activity, unspecified: Secondary | ICD-10-CM | POA: Diagnosis not present

## 2018-02-20 DIAGNOSIS — L02811 Cutaneous abscess of head [any part, except face]: Secondary | ICD-10-CM | POA: Diagnosis not present

## 2018-02-20 MED ORDER — SULFAMETHOXAZOLE-TRIMETHOPRIM 800-160 MG PO TABS
1.0000 | ORAL_TABLET | Freq: Once | ORAL | Status: AC
  Administered 2018-02-20: 1 via ORAL
  Filled 2018-02-20: qty 1

## 2018-02-20 MED ORDER — SULFAMETHOXAZOLE-TRIMETHOPRIM 800-160 MG PO TABS: 1.0000 | ORAL_TABLET | Freq: Two times a day (BID) | ORAL | 0 refills | Status: AC

## 2018-02-20 MED ORDER — ACETAMINOPHEN 500 MG PO TABS
1000.0000 mg | ORAL_TABLET | Freq: Once | ORAL | Status: AC
  Administered 2018-02-20: 1000 mg via ORAL
  Filled 2018-02-20: qty 2

## 2018-02-20 NOTE — ED Notes (Signed)
Patient verbalizes understanding of discharge instructions. Opportunity for questioning and answers were provided. Armband removed by staff, pt discharged from ED ambulatory.   

## 2018-02-20 NOTE — ED Provider Notes (Signed)
[facility] EMERGENCY DEPARTMENT Provider Note   CSN: [id number] Arrival date & time: 02/20/18  1300     History   Chief Complaint No chief complaint on file.   HPI April Byrd is a 43 y.o. female with history of asthma, diabetes, hydrocephalus status post shunt during childhood which was removed at age 22, is here for evaluation of posterior scalp pain.  Sudden onset Saturday after she bumped that side of her scalp on a cabinet.  The area is swollen, warm and tender.  The pain is constant but significantly worse with direct pressure.  This is the side where her shunt was and was removed at age 42.  States she has not required follow-up for this in almost 40 years.  The pain has been worsening and the swelling has gotten worse so she became concerned that this may be related to her surgery/shunt in the area.  Reports she has history of blurred vision that is not new, she wears prescription glasses as needed for reading and for driving.  This has not worsened.  Her blurred vision resolves when she puts on her glasses.  She took Tylenol without benefit.  No other interventions.  No alleviating factors.  There was no LOC, nausea, vomiting, dizziness after scalp trauma on Saturday.  She denies associated fevers, neck stiffness or pain, diplopia, difficulty with speech or walking, one-sided loss of sensation or weakness.  No anticoagulants.  HPI  Past Medical History:  Diagnosis Date  . Asthma    sports induced  . BMI 37.0-37.9,adult 04/26/2012  . Calculus of gallbladder with acute cholecystitis, without mention of obstruction 04/26/2012  . Diabetes mellitus   . Hypertension   . Type II or unspecified type diabetes mellitus without mention of complication, not stated as uncontrolled 04/26/2012  . Unspecified asthma(493.90) 04/26/2012  . Unspecified essential hypertension 04/26/2012    Patient Active Problem List   Diagnosis Date Noted  . Primary osteoarthritis of both feet  02/12/2018  . Primary osteoarthritis of both knees 02/12/2018  . Primary osteoarthritis of both hands 02/12/2018  . Calculus of gallbladder with acute cholecystitis 04/26/2012  . Type II or unspecified type diabetes mellitus without mention of complication, not stated as uncontrolled 04/26/2012  . Unspecified essential hypertension 04/26/2012  . Unspecified asthma(493.90) 04/26/2012  . BMI 37.0-37.9,adult 04/26/2012    Past Surgical History:  Procedure Laterality Date  . CESAREAN SECTION     x 3  . CHOLECYSTECTOMY N/A 04/25/2012   Procedure: LAPAROSCOPIC CHOLECYSTECTOMY WITH INTRAOPERATIVE CHOLANGIOGRAM;  Surgeon: Edward Jolly, MD;  Location: [facility];  Service: General;  Laterality: N/A;  . hydrocephalus surgery    . TUBAL LIGATION       OB History   No obstetric history on file.      Home Medications    Prior to Admission medications   Medication Sig Start Date End Date Taking? Authorizing Provider  ACCU-CHEK FASTCLIX LANCETS MISC USE AS DIRECTED TO TEST BLOOD SUGAR ONCE DAILY 12/18/17   Fulp, Cammie, MD  acetaminophen (TYLENOL) 325 MG tablet Do not take more than 4000 mg of tylenol (acetaminophen) in any 24 hour period.  This is also in your prescribed pain medicine. 04/26/12   Earnstine Regal, PA-C  albuterol (PROVENTIL HFA;VENTOLIN HFA) 108 (90 Base) MCG/ACT inhaler Inhale 2 puffs into the lungs every 6 (six) hours as needed for wheezing or shortness of breath. 12/11/17   Fulp, Cammie, MD  Aspirin-Acetaminophen (GOODYS BODY PAIN PO) Take 1 each by mouth  daily as needed (pain).     [provider]  Blood Glucose Monitoring Suppl (ACCU-CHEK AVIVA PLUS) w/Device KIT 1 each by Does not apply route 3 (three) times daily. 12/15/17   Fulp, Cammie, MD  glimepiride (AMARYL) 4 MG tablet Take 1 tablet (4 mg total) by mouth daily before breakfast. Do not take med unless planning to eat within 30 minutes Patient not taking: Reported on 02/05/2018 12/11/17   Fulp, Cammie, MD    GLIPIZIDE PO Take by mouth.    [provider]  glucose blood (ACCU-CHEK GUIDE) test strip USE AS DIRECTED TO TEST BLOOD SUGAR ONCE DAILY 12/18/17   Fulp, Cammie, MD  ibuprofen (ADVIL,MOTRIN) 600 MG tablet Take 1 tablet (600 mg total) by mouth every 6 (six) hours as needed. 08/10/17   Julianne Rice, MD  metFORMIN (GLUCOPHAGE) 500 MG tablet 2 pills twice per day with a meal Patient not taking: Reported on 02/05/2018 12/11/17   Fulp, Ander Gaster, MD  sulfamethoxazole-trimethoprim (BACTRIM DS,SEPTRA DS) 800-160 MG tablet Take 1 tablet by mouth 2 (two) times daily for 7 days. 02/20/18 02/27/18  Kinnie Feil, PA-C    Family History Family History  Problem Relation Age of Onset  . Diabetes Mother   . Hypertension Mother   . Kidney disease Father   . Healthy Brother   . Healthy Son   . Healthy Son   . Healthy Son     Social History Social History   Tobacco Use  . Smoking status: Never Smoker  . Smokeless tobacco: Never Used  Substance Use Topics  . Alcohol use: Yes    Comment: social   . Drug use: No     Allergies   Penicillins   Review of Systems Review of Systems  Neurological:       Scalp/head pain   All other systems reviewed and are negative.    Physical Exam Updated Vital Signs BP (!) 133/94   Pulse 94   Temp 98.8 F (37.1 C)   Resp 20   LMP 01/16/2018 (Approximate)   SpO2 99%   Physical Exam Vitals signs and nursing note reviewed.  Constitutional:      General: She is not in acute distress.    Appearance: She is well-developed.     Comments: NAD.  HENT:     Head: Normocephalic and atraumatic.     Right Ear: External ear normal.     Left Ear: External ear normal.     Ears:     Comments: R external ear normal. No mastoid tenderness. No ear protrusion. No pain with ear movements.    Nose: Nose normal.  Eyes:     General: No scleral icterus.    Conjunctiva/sclera: Conjunctivae normal.  Neck:     Musculoskeletal: Normal range of motion and  neck supple.     Comments: No midline or paraspinal muscle tenderness.  Full ROM of neck without rigidity or pain.   Cardiovascular:     Rate and Rhythm: Normal rate and regular rhythm.     Heart sounds: Normal heart sounds. No murmur.  Pulmonary:     Effort: Pulmonary effort is normal.     Breath sounds: Normal breath sounds. No wheezing.  Musculoskeletal: Normal range of motion.        General: No deformity.  Lymphadenopathy:     Head:     Right side of head: Posterior auricular and occipital adenopathy present.     Comments: Mildly tender R occipital, post auricular and posterior  superficial chain lymph nodes.    Skin:    General: Skin is warm and dry.     Capillary Refill: Capillary refill takes less than 2 seconds.     Comments: Focal area approx size of a quarter to right occipital prominence with fluctuance, erythema, and tenderness. There is a white central opening that is actively draining small amount of purulent discharge.  No focal mastoid tenderness. No protrusion of ears.    Neurological:     Mental Status: She is alert and oriented to person, place, and time.     Comments: Alert and oriented to self, place, time and event.  Speech is fluent without obvious dysarthria or aphasia. Strength 5/5 in upper and lower extremities   Sensation to light touch intact in bilateral face, upper and lower extremities No truncal sway. No pronator drift. No leg drop.  Normal finger-to-nose and finger tapping.  CN II-XII grossly intact bilaterally.   Psychiatric:        Behavior: Behavior normal.        Thought Content: Thought content normal.        Judgment: Judgment normal.      ED Treatments / Results  Labs (all labs ordered are listed, but only abnormal results are displayed) Labs Reviewed - No data to display  EKG None  Radiology Ct Head Wo Contrast  Result Date: 02/20/2018 CLINICAL DATA:  Struck back of head 3 days ago. Subsequent headache and blurry vision. EXAM: CT  HEAD WITHOUT CONTRAST TECHNIQUE: Contiguous axial images were obtained from the base of the skull through the vertex without intravenous contrast. COMPARISON:  None. FINDINGS: BRAIN: No intraparenchymal hemorrhage, mass effect nor midline shift. Mild global parenchymal brain volume loss. Moderate vermian atrophy. No hydrocephalus. No acute large vascular territory infarcts. No abnormal extra-axial fluid collections. Basal cisterns are patent. VASCULAR: Unremarkable. SKULL/SOFT TISSUES: No skull fracture. No significant soft tissue swelling. ORBITS/SINUSES: The included ocular globes and orbital contents are normal.Paranasal sinuses are well aerated. Mastoid air cells are well aerated. OTHER: None. IMPRESSION: 1. No acute intracranial process. 2. Mild global parenchymal brain volume loss, advanced for age with disproportionate moderate vermian atrophy. Electronically Signed   By: Elon Alas M.D.   On: 02/20/2018 15:56    Procedures Procedures (including critical care time)  Medications Ordered in ED Medications  sulfamethoxazole-trimethoprim (BACTRIM DS,SEPTRA DS) 800-160 MG per tablet 1 tablet (1 tablet Oral Given 02/20/18 2231)  acetaminophen (TYLENOL) tablet 1,000 mg (1,000 mg Oral Given 02/20/18 2231)     Initial Impression / Assessment and Plan / ED Course  I have reviewed the triage vital signs and the nursing notes.  Pertinent labs & imaging results that were available during my care of the patient were reviewed by me and considered in my medical decision making (see chart for details).    I suspect pain is secondary to obvious, actively draining scalp abscess.  Could also be from recent direct trama.  CT head obtained at triage does not show any acute changes.  I discussed findings of decreased parenchymal volume with patient likely chronic, recommended she f/u with PCP for further discussion of this.  Neuro exam is normal. She is not high risk for intracranial bleed, no OAC.  Hx and  exam not consistent with meningitis or neck vascular dissection or injury.  Complications related to shunt removal close to 43 yo is considered very unlikely.  Most significant area of pain is on the draining abscess with likely reactive LAD as noted  above.  No signs of otitis externa or mastoiditis.  I do not see emergent reason for I&D but I did offer this to patient to facilitate faster drainage, she declined.  Encouraged warm compression/massage. Will dc with tx for abscess, NSAIDs. Return precautions discussed. Pt is comfortable with this.  Discussed with Dr Tyrone Nine.  Final Clinical Impressions(s) / ED Diagnoses   Final diagnoses:  Scalp abscess  Head trauma, initial encounter    ED Discharge Orders         Ordered    sulfamethoxazole-trimethoprim (BACTRIM DS,SEPTRA DS) 800-160 MG tablet  2 times daily     02/20/18 2228           Kinnie Feil, PA-C 02/21/18 [place], [place], DO 02/25/18 [phone]

## 2018-02-20 NOTE — ED Notes (Signed)
Updated on wait for treatment room. 

## 2018-02-20 NOTE — ED Triage Notes (Signed)
Pt here with c/o head aches and blurred vision after hitting her head on a cabinet

## 2018-02-20 NOTE — Discharge Instructions (Signed)
You were seen in the ER for posterior scalp pain and swelling after you bumped it on a cabinet.  On exam, you have an obvious boil/abscess right where you are hurting.  This is already draining.  Treatment includes antibiotics and moist heat therapy.   Take antibiotic as prescribed and until completed. Symptoms typically improve in 48-72 hours. Apply moist heat (warm towel, heating pad) or massage under warm water at least twice a day to help drainage. Any abscess can worsen, enlarge and spread infection into blood stream.  Return to the ER if you have fevers, chills, worsening swelling, redness, warmth.   Return to the ER if there is headaches, neck pain or stiffness, worsening swelling and pain to the area, vision changes, difficulty with speech balance or walking, one-sided loss of sensation or strength.

## 2018-02-20 NOTE — ED Notes (Addendum)
Slight redness and tenderness noted on back of pt's head where she stated she had a shunt placed when younger (approx. 43 yrs old). Pt still complaining of headache and recent blurry vision since hitting back of head on cabinet recently

## 2018-02-26 ENCOUNTER — Ambulatory Visit: Payer: Self-pay | Admitting: Rheumatology

## 2018-03-15 ENCOUNTER — Encounter (HOSPITAL_COMMUNITY): Payer: Self-pay

## 2018-03-15 ENCOUNTER — Encounter (HOSPITAL_COMMUNITY): Payer: Self-pay | Admitting: Family

## 2018-03-15 ENCOUNTER — Ambulatory Visit (HOSPITAL_COMMUNITY)
Admission: EM | Admit: 2018-03-15 | Discharge: 2018-03-15 | Disposition: A | Discharge status: Home / Self Care | Payer: Medicaid Other | Attending: Family | Admitting: Family

## 2018-03-15 DIAGNOSIS — L03811 Cellulitis of head [any part, except face]: Secondary | ICD-10-CM | POA: Diagnosis not present

## 2018-03-15 DIAGNOSIS — L02811 Cutaneous abscess of head [any part, except face]: Secondary | ICD-10-CM

## 2018-03-15 MED ORDER — SULFAMETHOXAZOLE-TRIMETHOPRIM 800-160 MG PO TABS: 1.0000 | ORAL_TABLET | Freq: Two times a day (BID) | ORAL | 0 refills | Status: AC

## 2018-03-15 MED ORDER — NAPROXEN 500 MG PO TABS: 500.0000 mg | ORAL_TABLET | Freq: Two times a day (BID) | ORAL | 0 refills | Status: DC | PRN

## 2018-03-15 NOTE — ED Provider Notes (Signed)
[facility]    CSN: [id number] Arrival date & time: 03/15/18  0831     History   Chief Complaint Chief Complaint  Patient presents with  . Scalp Infection    HPI April Byrd is a 43 y.o. female.   43 year old female presents with re-evaluation of scalp infection on left posterior area. Started with injury to her scalp about a month ago. She hit the back of her head on a cabinet. Did not bleed but shortly after developed an abscess in the area and was seen in the ER on 02/20/18. She has a history of a hydrocephalus and shunt which was removed at age 78 and scar is right beside abscess so they performed a CT scan which was negative for any trauma. They offered I & D of the abscess but patient declined and she was started on Bactrim for 1 week which helped. Now the area has become more swollen, red and painful again. Willing to have I & D if will resolve infection. Other chronic health issues include asthma and asthma. Uses Albuterol prn. Also has history of type 2 diabetes- no medication currently- controlling with diet.   The history is provided by the patient.    Past Medical History:  Diagnosis Date  . Asthma    sports induced  . BMI 37.0-37.9,adult 04/26/2012  . Calculus of gallbladder with acute cholecystitis, without mention of obstruction 04/26/2012  . Diabetes mellitus   . Hypertension   . Type II or unspecified type diabetes mellitus without mention of complication, not stated as uncontrolled 04/26/2012  . Unspecified asthma(493.90) 04/26/2012  . Unspecified essential hypertension 04/26/2012    Patient Active Problem List   Diagnosis Date Noted  . Primary osteoarthritis of both feet 02/12/2018  . Primary osteoarthritis of both knees 02/12/2018  . Primary osteoarthritis of both hands 02/12/2018  . Calculus of gallbladder with acute cholecystitis 04/26/2012  . Type II or unspecified type diabetes mellitus without mention of complication, not stated as uncontrolled  04/26/2012  . Unspecified essential hypertension 04/26/2012  . Unspecified asthma(493.90) 04/26/2012  . BMI 37.0-37.9,adult 04/26/2012    Past Surgical History:  Procedure Laterality Date  . CESAREAN SECTION     x 3  . CHOLECYSTECTOMY N/A 04/25/2012   Procedure: LAPAROSCOPIC CHOLECYSTECTOMY WITH INTRAOPERATIVE CHOLANGIOGRAM;  Surgeon: Edward Jolly, MD;  Location: [facility];  Service: General;  Laterality: N/A;  . hydrocephalus surgery    . TUBAL LIGATION      OB History   No obstetric history on file.      Home Medications    Prior to Admission medications   Medication Sig Start Date End Date Taking? Authorizing Provider  ACCU-CHEK FASTCLIX LANCETS MISC USE AS DIRECTED TO TEST BLOOD SUGAR ONCE DAILY 12/18/17   Fulp, Cammie, MD  acetaminophen (TYLENOL) 325 MG tablet Do not take more than 4000 mg of tylenol (acetaminophen) in any 24 hour period.  This is also in your prescribed pain medicine. 04/26/12   Earnstine Regal, PA-C  albuterol (PROVENTIL HFA;VENTOLIN HFA) 108 (90 Base) MCG/ACT inhaler Inhale 2 puffs into the lungs every 6 (six) hours as needed for wheezing or shortness of breath. 12/11/17   Fulp, Cammie, MD  Aspirin-Acetaminophen (GOODYS BODY PAIN PO) Take 1 each by mouth daily as needed (pain).     [provider]  Blood Glucose Monitoring Suppl (ACCU-CHEK AVIVA PLUS) w/Device KIT 1 each by Does not apply route 3 (three) times daily. 12/15/17   Antony Blackbird, MD  glucose blood (ACCU-CHEK GUIDE) test strip USE AS DIRECTED TO TEST BLOOD SUGAR ONCE DAILY 12/18/17   Fulp, Cammie, MD  naproxen (NAPROSYN) 500 MG tablet Take 1 tablet (500 mg total) by mouth 2 (two) times daily as needed for moderate pain. 03/15/18   Katy Apo, NP  sulfamethoxazole-trimethoprim (BACTRIM DS,SEPTRA DS) 800-160 MG tablet Take 1 tablet by mouth 2 (two) times daily for 10 days. 03/15/18 03/25/18  Katy Apo, NP    Family History Family History  Problem Relation Age of Onset  .  Diabetes Mother   . Hypertension Mother   . Kidney disease Father   . Healthy Brother   . Healthy Son   . Healthy Son   . Healthy Son     Social History Social History   Tobacco Use  . Smoking status: Never Smoker  . Smokeless tobacco: Never Used  Substance Use Topics  . Alcohol use: Yes    Comment: social   . Drug use: No     Allergies   Penicillins   Review of Systems Review of Systems  Constitutional: Negative for activity change, appetite change, chills, fatigue and fever.  HENT: Negative for congestion, ear discharge, ear pain, facial swelling, mouth sores, nosebleeds, postnasal drip, rhinorrhea, sinus pressure and sinus pain.   Eyes: Negative for photophobia and visual disturbance.  Respiratory: Negative for cough, chest tightness, shortness of breath and wheezing.   Gastrointestinal: Negative for nausea and vomiting.  Musculoskeletal: Positive for arthralgias and myalgias. Negative for neck pain and neck stiffness.  Skin: Positive for color change and wound (abscess). Negative for rash.  Neurological: Positive for headaches. Negative for dizziness, tremors, seizures, syncope, weakness, light-headedness and numbness.  Hematological: Negative for adenopathy. Does not bruise/bleed easily.     Physical Exam Triage Vital Signs ED Triage Vitals  Enc Vitals Group     BP 03/15/18 0847 121/88     Pulse Rate 03/15/18 0847 78     Resp 03/15/18 0847 16     Temp 03/15/18 0847 98.1 F (36.7 C)     Temp Source 03/15/18 0847 Oral     SpO2 03/15/18 0847 99 %     Weight --      Height --      Head Circumference --      Peak Flow --      Pain Score 03/15/18 0853 6     Pain Loc --      Pain Edu? --      Excl. in [place]? --    No data found.  Updated Vital Signs BP 121/88 (BP Location: Left Arm)   Pulse 78   Temp 98.1 F (36.7 C) (Oral)   Resp 16   LMP 01/24/2018 Comment: Tubal Ligation January 2020  SpO2 99%   Visual Acuity Right Eye Distance:   Left Eye  Distance:   Bilateral Distance:    Right Eye Near:   Left Eye Near:    Bilateral Near:     Physical Exam Vitals signs and nursing note reviewed.  Constitutional:      General: She is awake. She is not in acute distress.    Appearance: Normal appearance. She is well-developed, well-groomed and overweight. She is not ill-appearing.     Comments: Patient sitting comfortably on exam table in no acute distress.   HENT:     Head: Normocephalic. No laceration. Hair is abnormal (loss of hair around abscess).     Jaw: There is normal jaw occlusion.  Comments: 4cm by 3cm oval abscess present on left occipital area just lateral to shunt scar and in line with left pinna. Warm, red and swollen but non-tender on actual abscess. Tenderness surrounding abscess laterally to back of ear and down to upper part of neck. Hair loss present in area of abscess . No current drainage.     Right Ear: External ear normal.     Left Ear: External ear normal.     Nose: Nose normal.  Eyes:     Extraocular Movements: Extraocular movements intact.     Conjunctiva/sclera: Conjunctivae normal.  Neck:     Musculoskeletal: Normal range of motion and neck supple. No neck rigidity.  Cardiovascular:     Rate and Rhythm: Normal rate.  Pulmonary:     Effort: Pulmonary effort is normal.  Musculoskeletal: Normal range of motion.  Lymphadenopathy:     Head:     Right side of head: No posterior auricular or occipital adenopathy.     Left side of head: No posterior auricular or occipital adenopathy.     Cervical: No cervical adenopathy.  Skin:    General: Skin is warm and dry.     Capillary Refill: Capillary refill takes less than 2 seconds.     Findings: Abscess (left mid-occipital area ) and erythema present. No rash.  Neurological:     General: No focal deficit present.     Mental Status: She is alert and oriented to person, place, and time.     Cranial Nerves: No cranial nerve deficit.     Sensory: Sensation is  intact. No sensory deficit.     Motor: Motor function is intact.  Psychiatric:        Mood and Affect: Mood normal.        Behavior: Behavior normal. Behavior is cooperative.        Thought Content: Thought content normal.        Judgment: Judgment normal.        UC Treatments / Results  Labs (all labs ordered are listed, but only abnormal results are displayed) Labs Reviewed  AEROBIC CULTURE (SUPERFICIAL SPECIMEN)    EKG None  Radiology No results found.  Procedures Incision and Drainage Date/Time: 03/15/2018 9:51 AM Performed by: Katy Apo, NP Authorized by: Katy Apo, NP   Consent:    Consent obtained:  Verbal   Consent given by:  Patient   Risks discussed:  Bleeding, incomplete drainage, pain and infection   Alternatives discussed:  No treatment, alternative treatment and referral Location:    Type:  Abscess   Size:  4cm by 3cm oval - raised about 3cm   Location:  Head   Head location:  Scalp Pre-procedure details:    Skin preparation:  Betadine (alcohol swabs and) Anesthesia (see MAR for exact dosages):    Anesthesia method:  Topical application   Topical anesthesia: topical spray. Procedure type:    Complexity:  Simple Procedure details:    Needle aspiration: no     Incision types:  Single straight   Incision depth:  Dermal   Scalpel blade:  11   Wound management:  Debrided   Drainage:  Bloody and purulent   Drainage amount:  Moderate   Wound treatment:  Wound left open   Packing materials:  None Post-procedure details:    Patient tolerance of procedure:  Tolerated well, no immediate complications Comments:     Since abscess was minimally tender, patient tolerated procedure well. Wound culture obtained. Applied  Bacitracin ointment and covered with gauze.    (including critical care time)  Medications Ordered in UC Medications - No data to display  Initial Impression / Assessment and Plan / UC Course  I have reviewed the triage  vital signs and the nursing notes.  Pertinent labs & imaging results that were available during my care of the patient were reviewed by me and considered in my medical decision making (see chart for details).    Discussed with patient that hopefully I & D along with antibiotics will resolve abscess. However, discussed that she may need Surgical consult if not resolving. Will restart Bactrim DS twice a day for 10 days as directed. Wash area with soap and water. May take Naproxen 571m twice a day as needed for pain. If abscess/swelling does not resolve within a week or returns again, call [facility] for further evaluation. Otherwise, follow-up pending wound culture results.   Final Clinical Impressions(s) / UC Diagnoses   Final diagnoses:  Scalp abscess  Cellulitis of scalp     Discharge Instructions     Keep area clean and dry. May wash with soap and water later today. Start Bactrim 1 tablet twice a day as directed. May take Naproxen 5077mtwice a day as needed for pain. Continue to monitor area. If abscess/swelling returns or does not resolve within 1 week, call [facility] for further evaluation. Otherwise follow-up pending culture results.     ED Prescriptions    Medication Sig Dispense Auth. Provider   sulfamethoxazole-trimethoprim (BACTRIM DS,SEPTRA DS) 800-160 MG tablet Take 1 tablet by mouth 2 (two) times daily for 10 days. 20 tablet AmKaty ApoNP   naproxen (NAPROSYN) 500 MG tablet Take 1 tablet (500 mg total) by mouth 2 (two) times daily as needed for moderate pain. 30 tablet AmKaty ApoNP     Controlled Substance Prescriptions [place] Controlled Substance Registry consulted? Not Applicable   AmKaty ApoNP 03/15/18 1754

## 2018-03-15 NOTE — Discharge Instructions (Addendum)
Keep area clean and dry. May wash with soap and water later today. Start Bactrim 1 tablet twice a day as directed. May take Naproxen 500mg  twice a day as needed for pain. Continue to monitor area. If abscess/swelling returns or does not resolve within 1 week, call [facility] for further evaluation. Otherwise follow-up pending culture results.

## 2018-03-15 NOTE — ED Triage Notes (Signed)
Pt presents for refill on antibiotics for pre existing scalp infection she had from an injury a few weeks ago.

## 2018-03-17 LAB — AEROBIC CULTURE W GRAM STAIN (SUPERFICIAL SPECIMEN)

## 2018-04-11 DIAGNOSIS — L239 Allergic contact dermatitis, unspecified cause: Secondary | ICD-10-CM | POA: Diagnosis not present

## 2018-04-11 DIAGNOSIS — L219 Seborrheic dermatitis, unspecified: Secondary | ICD-10-CM | POA: Diagnosis not present

## 2018-06-26 ENCOUNTER — Ambulatory Visit: Payer: Medicaid Other | Admitting: Internal Medicine

## 2018-07-18 ENCOUNTER — Encounter: Payer: Self-pay | Admitting: Internal Medicine

## 2018-07-18 ENCOUNTER — Ambulatory Visit: Payer: Medicaid Other | Admitting: Internal Medicine

## 2018-07-18 NOTE — Progress Notes (Deleted)
Name: April Byrd  MRN/ DOB: [id number], [birth year]   Age/ Sex: 43 y.o., female    PCP: Antony Blackbird, MD   Reason for Endocrinology Evaluation: Type 2 Diabetes Mellitus     Date of Initial Endocrinology Visit: 07/18/2018     PATIENT IDENTIFIER: April Byrd is a 43 y.o. female with a past medical history of T2DM and Asthma and hydrocephalus (S/P shunt as a child). The patient presented for initial endocrinology clinic visit on 07/18/2018 for consultative assistance with her diabetes management.    HPI: April Byrd was    Diagnosed with T2DM *** Prior Medications tried/Intolerance: *** Currently checking blood sugars *** x / day,  before breakfast and ***.  Hypoglycemia episodes : ***               Symptoms: ***                 Frequency: ***/  Hemoglobin A1c was 12.5% in 11/2017 Patient required assistance for hypoglycemia:  Patient has required hospitalization within the last 1 year from hyper or hypoglycemia:   In terms of diet, the patient ***   HOME DIABETES REGIMEN: Glimepiride 4 mg daily    Statin: no ACE-I/ARB: no Prior Diabetic Education: {Yes/No:11203}   METER DOWNLOAD SUMMARY: Date range evaluated: *** Fingerstick Blood Glucose Tests = *** Average Number Tests/Day = *** Overall Mean FS Glucose = *** Standard Deviation = ***  BG Ranges: Low = *** High = ***   Hypoglycemic Events/30 Days: BG < 50 = *** Episodes of symptomatic severe hypoglycemia = ***   DIABETIC COMPLICATIONS: Microvascular complications:   ***  Denies: CKD   Last eye exam: Completed   Macrovascular complications:    Denies: CAD, PVD, CVA   PAST HISTORY: Past Medical History:  Past Medical History:  Diagnosis Date  . Asthma    sports induced  . BMI 37.0-37.9,adult 04/26/2012  . Calculus of gallbladder with acute cholecystitis, without mention of obstruction 04/26/2012  . Diabetes mellitus   . Hypertension   . Type II or unspecified type diabetes mellitus without  mention of complication, not stated as uncontrolled 04/26/2012  . Unspecified asthma(493.90) 04/26/2012  . Unspecified essential hypertension 04/26/2012    Past Surgical History:  Past Surgical History:  Procedure Laterality Date  . CESAREAN SECTION     x 3  . CHOLECYSTECTOMY N/A 04/25/2012   Procedure: LAPAROSCOPIC CHOLECYSTECTOMY WITH INTRAOPERATIVE CHOLANGIOGRAM;  Surgeon: Edward Jolly, MD;  Location: [facility];  Service: General;  Laterality: N/A;  . hydrocephalus surgery    . TUBAL LIGATION        Social History:  reports that she has never smoked. She has never used smokeless tobacco. She reports current alcohol use. She reports that she does not use drugs. Family History:  Family History  Problem Relation Age of Onset  . Diabetes Mother   . Hypertension Mother   . Kidney disease Father   . Healthy Brother   . Healthy Son   . Healthy Son   . Healthy Son       HOME MEDICATIONS: Allergies as of 07/18/2018      Reactions   Penicillins Other (See Comments)   Has patient had a PCN reaction causing immediate rash, facial/tongue/throat swelling, SOB or lightheadedness with hypotension: Unknown Has patient had a PCN reaction causing severe rash involving mucus membranes or skin necrosis: Unknown Has patient had a PCN reaction that required hospitalization: Unknown Has patient had a PCN reaction occurring within  the last 10 years: No If all of the above answers are "NO", then may proceed with Cephalosporin use.      Medication List       Accurate as of July 18, 2018  7:48 AM. If you have any questions, ask your nurse or doctor.        Accu-Chek Aviva Plus w/Device Kit 1 each by Does not apply route 3 (three) times daily.   Accu-Chek FastClix Lancets Misc USE AS DIRECTED TO TEST BLOOD SUGAR ONCE DAILY   acetaminophen 325 MG tablet Commonly known as: TYLENOL Do not take more than 4000 mg of tylenol (acetaminophen) in any 24 hour period.  This is also in your prescribed  pain medicine.   albuterol 108 (90 Base) MCG/ACT inhaler Commonly known as: VENTOLIN HFA Inhale 2 puffs into the lungs every 6 (six) hours as needed for wheezing or shortness of breath.   glucose blood test strip Commonly known as: Accu-Chek Guide USE AS DIRECTED TO TEST BLOOD SUGAR ONCE DAILY   GOODYS BODY PAIN PO Take 1 each by mouth daily as needed (pain).   naproxen 500 MG tablet Commonly known as: NAPROSYN Take 1 tablet (500 mg total) by mouth 2 (two) times daily as needed for moderate pain.        ALLERGIES: Allergies  Allergen Reactions  . Penicillins Other (See Comments)    Has patient had a PCN reaction causing immediate rash, facial/tongue/throat swelling, SOB or lightheadedness with hypotension: Unknown Has patient had a PCN reaction causing severe rash involving mucus membranes or skin necrosis: Unknown Has patient had a PCN reaction that required hospitalization: Unknown Has patient had a PCN reaction occurring within the last 10 years: No If all of the above answers are "NO", then may proceed with Cephalosporin use.      REVIEW OF SYSTEMS: A comprehensive ROS was conducted with the patient and is negative except as per HPI and below:  ROS    OBJECTIVE:   VITAL SIGNS: There were no vitals taken for this visit.   PHYSICAL EXAM:  General: Pt appears well and is in NAD  Hydration: Well-hydrated with moist mucous membranes and good skin turgor  HEENT: Head: Unremarkable with good dentition. Oropharynx clear without exudate.  Eyes: External eye exam normal without stare, lid lag or exophthalmos.  EOM intact.  PERRL.  Neck: General: Supple without adenopathy or carotid bruits. Thyroid: Thyroid size normal.  No goiter or nodules appreciated. No thyroid bruit.  Lungs: Clear with good BS bilat with no rales, rhonchi, or wheezes  Heart: RRR with normal S1 and S2 and no gallops; no murmurs; no rub  Abdomen: Normoactive bowel sounds, soft, nontender, without  masses or organomegaly palpable  Extremities:  Lower extremities - No pretibial edema. No lesions.  Skin: Normal texture and temperature to palpation. No rash noted. No Acanthosis nigricans/skin tags. No lipohypertrophy.  Neuro: MS is good with appropriate affect, pt is alert and Ox3    DM foot exam:    DATA REVIEWED:  Lab Results  Component Value Date   HGBA1C 12.5 (A) 12/11/2017   Lab Results  Component Value Date   CREATININE 0.82 12/11/2017   No results found for: MICRALBCREAT  No results found for: CHOL, HDL, LDLCALC, LDLDIRECT, TRIG, CHOLHDL      ASSESSMENT / PLAN / RECOMMENDATIONS:   1) Type *** Diabetes Mellitus, ***controlled, With*** complications - Most recent A1c of *** %. Goal A1c < *** %.  ***  Plan: GENERAL:  ***  MEDICATIONS:  ***  EDUCATION / INSTRUCTIONS:  BG monitoring instructions: Patient is instructed to check her blood sugars *** times a day, ***.  Call [facility] if: BG persistently < 70 or > 300. . I reviewed the Rule of 15 for the treatment of hypoglycemia in detail with the patient. Literature supplied.   2) Diabetic complications:   Eye: Does *** have known diabetic retinopathy.   Neuro/ Feet: Does *** have known diabetic peripheral neuropathy.  Renal: Patient does *** have known baseline CKD. She is *** on an ACEI/ARB at present.Check urine albumin/creatinine ratio yearly starting at time of diagnosis. If albuminuria is positive, treatment is geared toward better glucose, blood pressure control and use of ACE inhibitors or ARBs. Monitor electrolytes and creatinine once to twice yearly.   3) Lipids: Patient is *** on a statin.    4) Hypertension: ***  at goal of < 140/90 mmHg.       Signed electronically by: Mack Guise, MD  [facility]  [facility] [address] [place], [place] [address] Phone: [phone] FAX: [phone]   CC: Antony Blackbird, MD [place] [address] Phone: [phone]  Fax: [phone]    Return to Endocrinology clinic as below: Future Appointments  Date Time Provider [place]  07/18/2018 10:30 AM Shamleffer, Melanie Crazier, MD [facility] None

## 2018-08-23 ENCOUNTER — Ambulatory Visit: Payer: Medicaid Other | Admitting: Family Medicine

## 2018-08-30 ENCOUNTER — Encounter: Payer: Self-pay | Admitting: Family Medicine

## 2018-08-30 ENCOUNTER — Telehealth (HOSPITAL_BASED_OUTPATIENT_CLINIC_OR_DEPARTMENT_OTHER): Payer: Medicaid Other | Admitting: Family Medicine

## 2018-08-30 DIAGNOSIS — N926 Irregular menstruation, unspecified: Secondary | ICD-10-CM | POA: Diagnosis not present

## 2018-08-30 DIAGNOSIS — E1165 Type 2 diabetes mellitus with hyperglycemia: Secondary | ICD-10-CM

## 2018-08-30 MED ORDER — GLIMEPIRIDE 2 MG PO TABS: 2.0000 mg | ORAL_TABLET | Freq: Every day | ORAL | 3 refills | Status: DC

## 2018-08-30 MED ORDER — METFORMIN HCL 500 MG PO TABS: 500.0000 mg | ORAL_TABLET | Freq: Two times a day (BID) | ORAL | 1 refills | Status: DC

## 2018-08-30 NOTE — Progress Notes (Signed)
Virtual Visit via Video Note  I connected with April Byrd on 08/30/18 at 10:50 AM EDT by a video enabled telemedicine application and verified that I am speaking with the correct person using two identifiers.  Location: Patient: [name]: [facility] Office   I discussed the limitations of evaluation and management by telemedicine and the availability of in person appointments. The patient expressed understanding and agreed to proceed.  History of Present Illness:      43 yo female who was last seen in the office on 12/11/2017 to establish care due to multiple joint pain suspicious for rheumatoid arthritis for which she needed rheumatology referral and patient had also not received any treatment for her type 2 diabetes and not been on medication for over a year.  Patient reports that she has not been on any medications for her diabetes and that she never received information regarding follow-up with endocrinology after her last visit.  Patient also states that she thought that I had taken her off of all of her diabetes medications at her last visit.  She states that when she has checked her blood sugars they have been in the 200s.  She has had increased thirst and urinary frequency.  No current issues with nonhealing wounds.  She was seen in the emergency department in February due to a scalp abscess.  She denies headaches or dizziness, no chest pain or palpitations, no abdominal pain-no nausea or vomiting and she has had no peripheral edema.  She did follow-up with rheumatology after her last visit.         She also has a concerned that she may be menopausal as she has had a regular menses and no recent menses.  She denies any possibility of pregnancy.  She thinks that she may have had some hot flashes but no night sweats.    Observations/Objective: On video visit, she appears to be well-nourished, well-developed, overweight for height and in no acute distress.  Patient appears to have normal  mood and judgment. (She however expressed no recall regarding endocrinology referral or need for office follow-up after her last appointment and did not recall being prescribed medications for control of her diabetes at her last visit).  Patient did not exhibit any signs of shortness of breath, cough.  She did not appear to be fatigued.   Assessment and Plan: 1. Type 2 diabetes mellitus with hyperglycemia, without long-term current use of insulin (Marc[place]) Patient was last seen in the office on 12/11/2017 and at that time, patient had not had a primary care provider in over a year due to lack of insurance and had not been on medication for control of her diabetes.  She did not have diabetes monitoring supplies and stated that she did not like sticking herself to check her blood sugars.  She reported prior use of metformin and Tyler Aas but because she did not like monitoring her blood sugars and was not sure what her blood sugars have been over the past year, but had hemoglobin A1c of 12.5, she was asked to restart metformin and new prescription was provided for Amaryl.  Patient at today's visit however states that I took her off of all of her medications at that visit, so she is not been on any medication for her diabetes.  She was also referred to endocrinology at her last visit but states that she never heard from endocrinology and did not contact the office regarding the referral.  Patient did however follow-up with  rheumatology appointment made at her last visit.  Patient has been asked to restart the use of metformin and new prescription sent to her pharmacy as well as glimepiride but will have patient start at 2 mg dose and patient will have blood work done here in the office within the next week and depending on tolerability of her Amaryl and metformin, will likely increase the Amaryl further to 4 mg in the morning and patient may potentially need to restart on insulin therapy and will need to monitor her  blood sugars.  She will additionally be referred to endocrinology to establish care depending on results of her upcoming blood work.  She will have comprehensive metabolic panel, hemoglobin A1c, lipid panel and microalbumin at her upcoming visit. - glimepiride (AMARYL) 2 MG tablet; Take 1 tablet (2 mg total) by mouth daily before breakfast. Must eat before taking medication  Dispense: 30 tablet; Refill: 3 - metFORMIN (GLUCOPHAGE) 500 MG tablet; Take 1 tablet (500 mg total) by mouth 2 (two) times daily with a meal.  Dispense: 180 tablet; Refill: 1 - Comprehensive metabolic panel; Future - Hemoglobin A1c - Lipid panel - Microalbumin/Creatinine Ratio, Urine  2. Irregular menses She will have FSH/LH in follow-up of her complaints of irregular menses and her concern that she may be menopausal. - FSH/LH  Follow Up Instructions:Return in about 2 weeks (around 09/13/2018) for DM-fasting labs and 2 week in person .    I discussed the assessment and treatment plan with the patient. The patient was provided an opportunity to ask questions and all were answered. The patient agreed with the plan and demonstrated an understanding of the instructions.   The patient was advised to call back or seek an in-person evaluation if the symptoms worsen or if the condition fails to improve as anticipated.  I provided 12 minutes of non-face-to-face time during this encounter.   Cain Saupeammie Tabias Swayze, MD

## 2018-08-31 ENCOUNTER — Ambulatory Visit: Payer: Medicaid Other | Attending: Family Medicine

## 2018-08-31 DIAGNOSIS — N926 Irregular menstruation, unspecified: Secondary | ICD-10-CM | POA: Diagnosis not present

## 2018-08-31 DIAGNOSIS — E1165 Type 2 diabetes mellitus with hyperglycemia: Secondary | ICD-10-CM | POA: Diagnosis not present

## 2018-09-03 LAB — FSH/LH
FSH: 11.8 m[IU]/mL
LH: 7.3 m[IU]/mL

## 2018-09-03 LAB — HEMOGLOBIN A1C
Est. average glucose Bld gHb Est-mCnc: 321 mg/dL
Hgb A1c MFr Bld: 12.8 % — ABNORMAL HIGH (ref 4.8–5.6)

## 2018-09-03 LAB — MICROALBUMIN / CREATININE URINE RATIO
Creatinine, Urine: 190.6 mg/dL
Microalb/Creat Ratio: 217 mg/g{creat} — ABNORMAL HIGH (ref 0–29)
Microalbumin, Urine: 414 ug/mL

## 2018-09-03 LAB — LIPID PANEL
Chol/HDL Ratio: 2.8 ratio (ref 0.0–4.4)
Cholesterol, Total: 163 mg/dL (ref 100–199)
HDL: 59 mg/dL
LDL Calculated: 82 mg/dL (ref 0–99)
Triglycerides: 109 mg/dL (ref 0–149)
VLDL Cholesterol Cal: 22 mg/dL (ref 5–40)

## 2018-09-05 ENCOUNTER — Other Ambulatory Visit: Payer: Self-pay | Admitting: Family Medicine

## 2018-09-05 DIAGNOSIS — E1165 Type 2 diabetes mellitus with hyperglycemia: Secondary | ICD-10-CM

## 2018-09-05 NOTE — Progress Notes (Signed)
Patient ID: April Byrd, female   DOB: Nov 12, 1975, 43 y.o.   MRN: [id number]   Patient with hemoglobin of 12.8 on recent blood work and will be referred to endocrinology for further evaluation and treatment of her uncontrolled diabetes.  Patient has upcoming office visit later this month

## 2018-09-12 ENCOUNTER — Encounter: Payer: Medicaid Other | Admitting: Family Medicine

## 2018-09-12 ENCOUNTER — Ambulatory Visit: Payer: Medicaid Other | Admitting: Family Medicine

## 2018-09-14 ENCOUNTER — Telehealth: Payer: Self-pay | Admitting: *Deleted

## 2018-09-14 NOTE — Telephone Encounter (Deleted)
-----   Message from Antony Blackbird, MD sent at 09/14/2018 11:40 AM EDT ----- Harle Battiest, I saw two patients on the same date with similar names. It is Charna Elizabeth who was referred to Nephrology after I received her lab notes-I believe you sent me a patient call regarding a referral to nephrology visit on a different patient- Ms. Julien Nordmann was referred to endocrinology

## 2018-09-14 NOTE — Telephone Encounter (Signed)
Opened in Error.

## 2018-10-09 ENCOUNTER — Telehealth: Payer: Self-pay | Admitting: Obstetrics & Gynecology

## 2018-10-09 NOTE — Telephone Encounter (Signed)
Called the patient to inform of the upcoming appointment. Informed the patient of wearing a mask, no visitors, or children are allowed due to covid restrictions. The patient also answered no to all of the covid19 screening questions. °

## 2018-10-10 ENCOUNTER — Other Ambulatory Visit: Payer: Self-pay

## 2018-10-10 ENCOUNTER — Other Ambulatory Visit (HOSPITAL_COMMUNITY)
Admission: RE | Admit: 2018-10-10 | Discharge: 2018-10-10 | Disposition: A | Discharge status: Home / Self Care | Payer: Medicaid Other | Source: Ambulatory Visit | Attending: Family Medicine | Admitting: Family Medicine

## 2018-10-10 ENCOUNTER — Ambulatory Visit (INDEPENDENT_AMBULATORY_CARE_PROVIDER_SITE_OTHER): Payer: Medicaid Other | Admitting: Obstetrics and Gynecology

## 2018-10-10 ENCOUNTER — Encounter: Payer: Self-pay | Admitting: Obstetrics and Gynecology

## 2018-10-10 ENCOUNTER — Other Ambulatory Visit (HOSPITAL_COMMUNITY)
Admission: RE | Admit: 2018-10-10 | Discharge: 2018-10-10 | Disposition: A | Discharge status: Home / Self Care | Payer: Medicaid Other | Source: Ambulatory Visit | Attending: Obstetrics and Gynecology | Admitting: Obstetrics and Gynecology

## 2018-10-10 VITALS — BP 131/89 | HR 85 | Wt 220.2 lb

## 2018-10-10 DIAGNOSIS — N939 Abnormal uterine and vaginal bleeding, unspecified: Secondary | ICD-10-CM | POA: Insufficient documentation

## 2018-10-10 DIAGNOSIS — B373 Candidiasis of vulva and vagina: Secondary | ICD-10-CM | POA: Diagnosis not present

## 2018-10-10 DIAGNOSIS — N840 Polyp of corpus uteri: Secondary | ICD-10-CM | POA: Diagnosis not present

## 2018-10-10 DIAGNOSIS — B3731 Acute candidiasis of vulva and vagina: Secondary | ICD-10-CM | POA: Insufficient documentation

## 2018-10-10 DIAGNOSIS — Z0000 Encounter for general adult medical examination without abnormal findings: Secondary | ICD-10-CM | POA: Insufficient documentation

## 2018-10-10 MED ORDER — METRONIDAZOLE 500 MG PO TABS: 500.0000 mg | ORAL_TABLET | Freq: Two times a day (BID) | ORAL | 0 refills | Status: AC

## 2018-10-10 MED ORDER — FLUCONAZOLE 150 MG PO TABS: ORAL_TABLET | ORAL | 0 refills | Status: DC

## 2018-10-10 NOTE — Telephone Encounter (Signed)
Opened in error

## 2018-10-10 NOTE — Procedures (Signed)
Endometrial Biopsy Procedure Note  Pre-operative Diagnosis: AUB. BMI 30s. HTN, DM2  Post-operative Diagnosis: same  Procedure Details  The risks (including infection, bleeding, pain, and uterine perforation) and benefits of the procedure were explained to the patient and Written informed consent was obtained.  The patient was placed in the dorsal lithotomy position.  EGBUS with b/l erythema and pink tinge c/w yeast infection.   The cervix unable to be seen with Graves speculum; +moderate amount of white cottage cheese like discharge in vault. On bimanual, it is very anterior and not mobile. Great difficulty in visualizing cervix and tenaculum had to be applied to posterior lip to bring down and visualize it. It looked grossly normal. A pap smear was done, the cervix cleaned with betadine and then a pipelle was inserted into the uterine cavity and sounded the uterus to a depth of 9.5cm.  A Moderate amount of tissue and blood was collected after 1 passes. The sample was sent for pathologic examination.  Condition: Stable  Complications: None  Plan: The patient was advised to call for any fever or for prolonged or severe pain or bleeding. She was advised to use OTC analgesics as needed for mild to moderate pain. She was advised to avoid vaginal intercourse for 48 hours or until the bleeding has completely stopped.  Durene Romans MD Attending Center for [employer])

## 2018-10-10 NOTE — Progress Notes (Signed)
Obstetrics and Gynecology New Patient Evaluation  Appointment Date: 10/10/2018  OBGYN Clinic: Center for [facility]  Primary Care Provider: Antony Blackbird  Referring Provider: Antony Blackbird, MD  Chief Complaint:  Chief Complaint  Patient presents with  . Gynecologic Exam    History of Present Illness: April Byrd is a 43 y.o. African-American P3 (LMP: 8/20), seen for the above chief complaint. Her past medical history is significant for HTN, DM2 (poorly controlled), BMI 30s, c-section x 3, BTL  For close to the past year, has 3 day period like normal (not heavy or painful) and then has an even lighter bleeding/spotting (doesn't even need pad and no cramping) about 10 days later.   She was seen by her PCP on 8/12 and had FSH/LH drawn (negative) and referred to GYN.   No vaginal itching, discharge, breast s/s, hot flashes, night sweats, vaginal dryness.   Review of Systems: as noted in the History of Present Illness.  Patient Active Problem List   Diagnosis Date Noted  . Vulvovaginal candidiasis 10/10/2018  . Abnormal uterine bleeding (AUB) 10/10/2018  . Primary osteoarthritis of both feet 02/12/2018  . Primary osteoarthritis of both knees 02/12/2018  . Primary osteoarthritis of both hands 02/12/2018  . Type II or unspecified type diabetes mellitus without mention of complication, not stated as uncontrolled 04/26/2012  . Unspecified essential hypertension 04/26/2012  . Unspecified asthma(493.90) 04/26/2012  . BMI 37.0-37.9,adult 04/26/2012    Past Medical History:  Past Medical History:  Diagnosis Date  . Asthma    sports induced  . BMI 37.0-37.9,adult 04/26/2012  . Calculus of gallbladder with acute cholecystitis 04/26/2012  . Calculus of gallbladder with acute cholecystitis, without mention of obstruction 04/26/2012  . Type II or unspecified type diabetes mellitus without mention of complication, not stated as uncontrolled 04/26/2012  . Unspecified asthma(493.90)  04/26/2012  . Unspecified essential hypertension 04/26/2012    Past Surgical History:  Past Surgical History:  Procedure Laterality Date  . CESAREAN SECTION     x 3  . CHOLECYSTECTOMY N/A 04/25/2012   Procedure: LAPAROSCOPIC CHOLECYSTECTOMY WITH INTRAOPERATIVE CHOLANGIOGRAM;  Surgeon: Edward Jolly, MD;  Location: [facility];  Service: General;  Laterality: N/A;  . hydrocephalus surgery    . TUBAL LIGATION      Past Obstetrical History:  OB History  Gravida Para Term Preterm AB Living  '6 3 3   3 3  ' SAB TAB Ectopic Multiple Live Births  3            # Outcome Date GA Lbr Len/2nd Weight Sex Delivery Anes PTL Lv  6 SAB           5 SAB           4 SAB           3 Term           2 Term           1 Term             Obstetric Comments  c-section x 3.     Past Gynecological History: As per HPI. History of Pap Smear(s): Yes.   Last pap 2015, which was neg and hpv neg She is currently using bilateral tubal ligation for contraception.   Social History:  Social History   Socioeconomic History  . Marital status: Single    Spouse name: Not on file  . Number of children: Not on file  . Years of education: Not on file  .  Highest education level: Not on file  Occupational History  . Not on file  Social Needs  . Financial resource strain: Not on file  . Food insecurity    Worry: Not on file    Inability: Not on file  . Transportation needs    Medical: Not on file    Non-medical: Not on file  Tobacco Use  . Smoking status: Never Smoker  . Smokeless tobacco: Never Used  Substance and Sexual Activity  . Alcohol use: Yes    Comment: social   . Drug use: No  . Sexual activity: Yes    Birth control/protection: Condom, Surgical  Lifestyle  . Physical activity    Days per week: Not on file    Minutes per session: Not on file  . Stress: Not on file  Relationships  . Social Herbalist on phone: Not on file    Gets together: Not on file    Attends religious  service: Not on file    Active member of club or organization: Not on file    Attends meetings of clubs or organizations: Not on file    Relationship status: Not on file  . Intimate partner violence    Fear of current or ex partner: Not on file    Emotionally abused: Not on file    Physically abused: Not on file    Forced sexual activity: Not on file  Other Topics Concern  . Not on file  Social History Narrative  . Not on file    Family History:  Family History  Problem Relation Age of Onset  . Diabetes Mother   . Hypertension Mother   . Kidney disease Father   . Healthy Brother   . Healthy Son   . Healthy Son   . Healthy Son    Health Maintenance:  Mammogram(s): No.   Medications Delano Sabra Heck "Erla Bacchi" had no medications administered during this visit. Current Outpatient Medications  Medication Sig Dispense Refill  . ACCU-CHEK FASTCLIX LANCETS MISC USE AS DIRECTED TO TEST BLOOD SUGAR ONCE DAILY 100 each 12  . acetaminophen (TYLENOL) 325 MG tablet Do not take more than 4000 mg of tylenol (acetaminophen) in any 24 hour period.  This is also in your prescribed pain medicine.    Marland Kitchen albuterol (PROVENTIL HFA;VENTOLIN HFA) 108 (90 Base) MCG/ACT inhaler Inhale 2 puffs into the lungs every 6 (six) hours as needed for wheezing or shortness of breath. 1 Inhaler 2  . Aspirin-Acetaminophen (GOODYS BODY PAIN PO) Take 1 each by mouth daily as needed (pain).     . Blood Glucose Monitoring Suppl (ACCU-CHEK AVIVA PLUS) w/Device KIT 1 each by Does not apply route 3 (three) times daily. 1 kit 0  . fluconazole (DIFLUCAN) 150 MG tablet 1 tab po q72h x 3 doses. Then 1 tab po qweek x 6 months 30 tablet 0  . glimepiride (AMARYL) 2 MG tablet Take 1 tablet (2 mg total) by mouth daily before breakfast. Must eat before taking medication 30 tablet 3  . glucose blood (ACCU-CHEK GUIDE) test strip USE AS DIRECTED TO TEST BLOOD SUGAR ONCE DAILY 100 each 12  . metFORMIN (GLUCOPHAGE) 500 MG tablet Take  1 tablet (500 mg total) by mouth 2 (two) times daily with a meal. 180 tablet 1  . metroNIDAZOLE (FLAGYL) 500 MG tablet Take 1 tablet (500 mg total) by mouth 2 (two) times daily for 7 days. 14 tablet 0  . naproxen (NAPROSYN) 500 MG tablet Take  1 tablet (500 mg total) by mouth 2 (two) times daily as needed for moderate pain. 30 tablet 0   No current facility-administered medications for this visit.     Allergies Penicillins   Physical Exam:  BP 131/89   Pulse 85   Wt 220 lb 3.2 oz (99.9 kg)   BMI 37.80 kg/m  Body mass index is 37.8 kg/m. General appearance: Well nourished, well developed female in no acute distress.  Cardiovascular: normal s1 and s2.  No murmurs, rubs or gallops. Respiratory:  Clear to auscultation bilateral. Normal respiratory effort Abdomen: positive bowel sounds and no masses, hernias; diffusely non tender to palpation, non distended Neuro/Psych:  Normal mood and affect.  Skin:  Warm and dry.  Lymphatic:  No inguinal lymphadenopathy.   Pelvic exam:  From embx note  The patient was placed in the dorsal lithotomy position.  EGBUS with b/l erythema and pink tinge c/w yeast infection.   The cervix unable to be seen with Graves speculum; +moderate amount of white cottage cheese like discharge in vault. On bimanual, it is very anterior and not mobile. Great difficulty in visualizing cervix and tenaculum had to be applied to posterior lip to bring down and visualize it. It looked grossly normal. A pap smear was done, and the cervix cleaned with betadine, and then a pipelle was inserted into the uterine cavity and sounded the uterus to a depth of 9.5cm.  A Moderate amount of tissue and blood was collected after 1 passes. The sample was sent for pathologic examination.  Laboratory: none  Radiology: none  Assessment: pt stable  Plan: 1. GYN See below. Pt set up for screening mammo - Cytology - PAP( [place]) - MM Digital Screening; Future  2. Abnormal uterine  bleeding (AUB) Recommend updating pap and doing an embx to rule out malignancy Will also get basic labs and early cycle transvaginal u/s ordered D/w pt re: options after labs, imaging - Cervicovaginal ancillary only - Surgical pathology( [place]) - US PELVIC COMPLETE WITH TRANSVAGINAL; Future - Prolactin - TSH  3. Vulvovaginal candidiasis Diflucan and then then suppressive diflucan. D/w her that only true treatment will be to get her a1c under control.   Orders Placed This Encounter  Procedures  . MM Digital Screening  . US PELVIC COMPLETE WITH TRANSVAGINAL  . Prolactin  . TSH    RTC after u/s  Durene Romans MD Attending [facility] for [facility])

## 2018-10-10 NOTE — Progress Notes (Signed)
Patient states her menstrual cycles are coming twice monthly and thinks that's the start of menopause, wants to discuss. Here for a Papsmear and breast exam.  Will Schedule Mammogram today. 11/22/2018 @730am 

## 2018-10-11 LAB — CERVICOVAGINAL ANCILLARY ONLY
Bacterial Vaginitis (gardnerella): POSITIVE — AB
Candida Glabrata: POSITIVE — AB
Candida Vaginitis: NEGATIVE
Molecular Disclaimer: NEGATIVE
Molecular Disclaimer: NEGATIVE
Molecular Disclaimer: NORMAL

## 2018-10-11 LAB — TSH: TSH: 1.46 u[IU]/mL (ref 0.450–4.500)

## 2018-10-11 LAB — SURGICAL PATHOLOGY

## 2018-10-11 LAB — PROLACTIN: Prolactin: 12.6 ng/mL (ref 4.8–23.3)

## 2018-10-14 LAB — CYTOLOGY - PAP
Adequacy: ABSENT
Chlamydia: NEGATIVE
Diagnosis: NEGATIVE
High risk HPV: NEGATIVE
Molecular Disclaimer: 56
Molecular Disclaimer: DETECTED
Molecular Disclaimer: NEGATIVE
Molecular Disclaimer: NORMAL
Neisseria Gonorrhea: NEGATIVE

## 2018-10-16 ENCOUNTER — Other Ambulatory Visit: Payer: Self-pay | Admitting: Obstetrics and Gynecology

## 2018-10-16 MED ORDER — MICONAZOLE NITRATE 2 % VA CREA: 1.0000 | TOPICAL_CREAM | Freq: Every day | VAGINAL | 2 refills | Status: DC

## 2018-10-24 ENCOUNTER — Ambulatory Visit (HOSPITAL_COMMUNITY): Payer: Medicaid Other

## 2018-10-30 ENCOUNTER — Ambulatory Visit (HOSPITAL_COMMUNITY): Admission: RE | Admit: 2018-10-30 | Payer: Medicaid Other | Source: Ambulatory Visit

## 2018-10-30 ENCOUNTER — Telehealth: Payer: Self-pay

## 2018-10-30 NOTE — Telephone Encounter (Signed)
Care management communication form faxed to [facility] of 

## 2018-11-01 ENCOUNTER — Telehealth: Payer: Medicaid Other | Admitting: Obstetrics and Gynecology

## 2018-11-01 NOTE — Progress Notes (Signed)
Patient did not keep her ultrasound appointment so her ultrasound follow up appointment for 11/01/2018 was rescheduled.  Durene Romans MD Attending Center for [facility])

## 2018-11-01 NOTE — Progress Notes (Signed)
Pt advised that she missed her U/S appt on 10/30/18, wants to reschedule.

## 2018-11-07 ENCOUNTER — Other Ambulatory Visit: Payer: Self-pay

## 2018-11-07 ENCOUNTER — Ambulatory Visit (HOSPITAL_COMMUNITY)
Admission: RE | Admit: 2018-11-07 | Discharge: 2018-11-07 | Disposition: A | Discharge status: Home / Self Care | Payer: Medicaid Other | Source: Ambulatory Visit | Attending: Obstetrics and Gynecology | Admitting: Obstetrics and Gynecology

## 2018-11-07 DIAGNOSIS — N939 Abnormal uterine and vaginal bleeding, unspecified: Secondary | ICD-10-CM | POA: Diagnosis not present

## 2018-11-22 ENCOUNTER — Other Ambulatory Visit: Payer: Self-pay

## 2018-11-22 ENCOUNTER — Ambulatory Visit
Admission: RE | Admit: 2018-11-22 | Discharge: 2018-11-22 | Disposition: A | Discharge status: Home / Self Care | Payer: Medicaid Other | Source: Ambulatory Visit | Attending: Obstetrics and Gynecology | Admitting: Obstetrics and Gynecology

## 2018-11-22 DIAGNOSIS — Z1231 Encounter for screening mammogram for malignant neoplasm of breast: Secondary | ICD-10-CM | POA: Diagnosis not present

## 2018-11-22 DIAGNOSIS — Z0000 Encounter for general adult medical examination without abnormal findings: Secondary | ICD-10-CM

## 2018-11-23 ENCOUNTER — Encounter: Payer: Self-pay | Admitting: *Deleted

## 2018-11-28 ENCOUNTER — Ambulatory Visit: Payer: Medicaid Other | Admitting: Endocrinology

## 2018-12-07 ENCOUNTER — Other Ambulatory Visit: Payer: Self-pay

## 2018-12-11 ENCOUNTER — Encounter: Payer: Self-pay | Admitting: Endocrinology

## 2018-12-11 ENCOUNTER — Other Ambulatory Visit: Payer: Self-pay

## 2018-12-11 ENCOUNTER — Ambulatory Visit (INDEPENDENT_AMBULATORY_CARE_PROVIDER_SITE_OTHER): Payer: Medicaid Other | Admitting: Endocrinology

## 2018-12-11 VITALS — BP 110/82 | HR 103 | Ht 64.0 in | Wt 220.6 lb

## 2018-12-11 DIAGNOSIS — E119 Type 2 diabetes mellitus without complications: Secondary | ICD-10-CM

## 2018-12-11 DIAGNOSIS — E1165 Type 2 diabetes mellitus with hyperglycemia: Secondary | ICD-10-CM | POA: Diagnosis not present

## 2018-12-11 LAB — POCT GLYCOSYLATED HEMOGLOBIN (HGB A1C): Hemoglobin A1C: 11.6 % — AB (ref 4.0–5.6)

## 2018-12-11 MED ORDER — METFORMIN HCL 500 MG PO TABS: 500.0000 mg | ORAL_TABLET | ORAL | 3 refills | Status: DC

## 2018-12-11 NOTE — Progress Notes (Signed)
 Subjective:    Patient ID: April Byrd, female    DOB: 08/28/1975, 43 y.o.   MRN: [id number]  HPI pt is referred by Dr Fulp, for diabetes.  Pt states DM was dx'ed in 2005 (she had GDM in 2000); she has mild if any neuropathy of the lower extremities; she is unaware of any associated chronic complications; she took insulin x 1 year, in 2017 (she stopped due to cost); pt says her diet and exercise are good; she has never had pancreatitis, pancreatic surgery, severe hypoglycemia or DKA.  She has had TL.  She takes 2 oral meds.  She says cbg varies from 127-160.   Past Medical History:  Diagnosis Date  . Asthma    sports induced  . BMI 37.0-37.9,adult 04/26/2012  . Calculus of gallbladder with acute cholecystitis 04/26/2012  . Calculus of gallbladder with acute cholecystitis, without mention of obstruction 04/26/2012  . Type II or unspecified type diabetes mellitus without mention of complication, not stated as uncontrolled 04/26/2012  . Unspecified asthma(493.90) 04/26/2012  . Unspecified essential hypertension 04/26/2012    Past Surgical History:  Procedure Laterality Date  . CESAREAN SECTION     x 3  . CHOLECYSTECTOMY N/A 04/25/2012   Procedure: LAPAROSCOPIC CHOLECYSTECTOMY WITH INTRAOPERATIVE CHOLANGIOGRAM;  Surgeon: Benjamin T Hoxworth, MD;  Location: [facility];  Service: General;  Laterality: N/A;  . hydrocephalus surgery    . TUBAL LIGATION      Social History   Socioeconomic History  . Marital status: Single    Spouse name: Not on file  . Number of children: Not on file  . Years of education: Not on file  . Highest education level: Not on file  Occupational History  . Not on file  Social Needs  . Financial resource strain: Not on file  . Food insecurity    Worry: Not on file    Inability: Not on file  . Transportation needs    Medical: Not on file    Non-medical: Not on file  Tobacco Use  . Smoking status: Never Smoker  . Smokeless tobacco: Never Used  Substance and Sexual  Activity  . Alcohol use: Yes    Comment: social   . Drug use: No  . Sexual activity: Yes    Birth control/protection: Condom, Surgical  Lifestyle  . Physical activity    Days per week: Not on file    Minutes per session: Not on file  . Stress: Not on file  Relationships  . Social connections    Talks on phone: Not on file    Gets together: Not on file    Attends religious service: Not on file    Active member of club or organization: Not on file    Attends meetings of clubs or organizations: Not on file    Relationship status: Not on file  . Intimate partner violence    Fear of current or ex partner: Not on file    Emotionally abused: Not on file    Physically abused: Not on file    Forced sexual activity: Not on file  Other Topics Concern  . Not on file  Social History Narrative  . Not on file    Current Outpatient Medications on File Prior to Visit  Medication Sig Dispense Refill  . ACCU-CHEK FASTCLIX LANCETS MISC USE AS DIRECTED TO TEST BLOOD SUGAR ONCE DAILY (Patient taking differently: 1 each by Other route daily. ) 100 each 12  . acetaminophen (TYLENOL) 325 MG tablet   Do not take more than 4000 mg of tylenol (acetaminophen) in any 24 hour period.  This is also in your prescribed pain medicine.    Marland Kitchen albuterol (PROVENTIL HFA;VENTOLIN HFA) 108 (90 Base) MCG/ACT inhaler Inhale 2 puffs into the lungs every 6 (six) hours as needed for wheezing or shortness of breath. 1 Inhaler 2  . Blood Glucose Monitoring Suppl (ACCU-CHEK AVIVA PLUS) w/Device KIT 1 each by Does not apply route 3 (three) times daily. (Patient taking differently: 1 each by Does not apply route daily. ) 1 kit 0  . fluconazole (DIFLUCAN) 150 MG tablet 1 tab po q72h x 3 doses. Then 1 tab po qweek x 6 months 30 tablet 0  . glimepiride (AMARYL) 2 MG tablet Take 1 tablet (2 mg total) by mouth daily before breakfast. Must eat before taking medication 30 tablet 3  . glucose blood (ACCU-CHEK GUIDE) test strip USE AS  DIRECTED TO TEST BLOOD SUGAR ONCE DAILY (Patient taking differently: 1 each by Other route daily. ) 100 each 12  . miconazole (MONISTAT 7) 2 % vaginal cream Place 1 Applicatorful vaginally at bedtime. Apply for seven nights 30 g 2   No current facility-administered medications on file prior to visit.     Allergies  Allergen Reactions  . Penicillins Other (See Comments)    Has patient had a PCN reaction causing immediate rash, facial/tongue/throat swelling, SOB or lightheadedness with hypotension: Unknown Has patient had a PCN reaction causing severe rash involving mucus membranes or skin necrosis: Unknown Has patient had a PCN reaction that required hospitalization: Unknown Has patient had a PCN reaction occurring within the last 10 years: No If all of the above answers are "NO", then may proceed with Cephalosporin use.     Family History  Problem Relation Age of Onset  . Diabetes Mother   . Hypertension Mother   . Kidney disease Father   . Healthy Brother   . Healthy Son   . Healthy Son   . Healthy Son     BP 110/82 (BP Location: Right Arm, Patient Position: Sitting, Cuff Size: Large)   Pulse (!) 103   Ht 5' 4" (1.626 m)   Wt 220 lb 9.6 oz (100.1 kg)   SpO2 98%   BMI 37.87 kg/m   Review of Systems denies weight loss, headache, chest pain, sob, vomiting, urinary frequency, muscle cramps, excessive diaphoresis, depression, cold intolerance, rhinorrhea, and easy bruising.  She has chronic blurry vision.  She has slight nausea, which she attributes to metformin.      Objective:   Physical Exam VS: see vs page GEN: no distress HEAD: head: no deformity eyes: no periorbital swelling, no proptosis external nose and ears are normal NECK: supple, thyroid is not enlarged CHEST WALL: no deformity LUNGS: clear to auscultation CV: reg rate and rhythm, no murmur ABD: abdomen is soft, nontender.  no hepatosplenomegaly.  not distended.  no hernia MUSCULOSKELETAL: muscle bulk and  strength are grossly normal.  no obvious joint swelling.  gait is normal and steady.   EXTEMITIES: no deformity.  no ulcer on the feet.  feet are of normal color and temp.  no edema.  PULSES: dorsalis pedis intact bilat.  no carotid bruit NEURO:  cn 2-12 grossly intact.   readily moves all 4's.  sensation is intact to touch on the feet.   SKIN:  Normal texture and temperature.  No rash or suspicious lesion is visible.   NODES:  None palpable at the neck.  PSYCH: alert, well-oriented.  Does not appear anxious nor depressed.     Lab Results  Component Value Date   HGBA1C 11.6 (A) 12/11/2018   Lab Results  Component Value Date   CREATININE 0.82 12/11/2017   BUN 6 12/11/2017   NA 136 12/11/2017   K 3.9 12/11/2017   CL 97 12/11/2017   CO2 24 12/11/2017   Lab Results  Component Value Date   TSH 1.460 10/10/2018   I personally reviewed electrocardiogram tracing (08/10/17): Indication: chest pain Impression: NSR.  Poss old MI.  No hypertrophy. No comparison is available     Assessment & Plan:  Type 2 DM, new to me: severe exacerbation.   Discordance between A1c and reported cbg's: check fructosamine.    Patient Instructions  good diet and exercise significantly improve the control of your diabetes.  please let me know if you wish to be referred to a dietician.  high blood sugar is very risky to your health.  you should see an eye doctor and dentist every year.  It is very important to get all recommended vaccinations.  Controlling your blood pressure and cholesterol drastically reduces the damage diabetes does to your body.  Those who smoke should quit.  Please discuss these with your doctor.  check your blood sugar once a day.  vary the time of day when you check, between before the 3 meals, and at bedtime.  also check if you have symptoms of your blood sugar being too high or too low.  please keep a record of the readings and bring it to your next appointment here (or you can bring  the meter itself).  You can write it on any piece of paper.  please call us sooner if your blood sugar goes below 70, or if you have a lot of readings over 200. A different type of diabetes blood test is requested for you today.  We'll let you know about the results.   If it is good, please continue the same medications.   If it is high, you should resume the insulin.   I have sent a prescription to your pharmacy, to reduce the metformin to 1 pill per day.

## 2018-12-11 NOTE — Patient Instructions (Addendum)
good diet and exercise significantly improve the control of your diabetes.  please let me know if you wish to be referred to a dietician.  high blood sugar is very risky to your health.  you should see an eye doctor and dentist every year.  It is very important to get all recommended vaccinations.  Controlling your blood pressure and cholesterol drastically reduces the damage diabetes does to your body.  Those who smoke should quit.  Please discuss these with your doctor.  check your blood sugar once a day.  vary the time of day when you check, between before the 3 meals, and at bedtime.  also check if you have symptoms of your blood sugar being too high or too low.  please keep a record of the readings and bring it to your next appointment here (or you can bring the meter itself).  You can write it on any piece of paper.  please call us sooner if your blood sugar goes below 70, or if you have a lot of readings over 200. A different type of diabetes blood test is requested for you today.  We'll let you know about the results.   If it is good, please continue the same medications.   If it is high, you should resume the insulin.   I have sent a prescription to your pharmacy, to reduce the metformin to 1 pill per day.

## 2019-05-15 ENCOUNTER — Telehealth: Payer: Self-pay | Admitting: Family Medicine

## 2019-05-15 DIAGNOSIS — H2513 Age-related nuclear cataract, bilateral: Secondary | ICD-10-CM | POA: Diagnosis not present

## 2019-05-15 DIAGNOSIS — H40033 Anatomical narrow angle, bilateral: Secondary | ICD-10-CM | POA: Diagnosis not present

## 2019-05-17 ENCOUNTER — Other Ambulatory Visit: Payer: Self-pay | Admitting: Family Medicine

## 2019-05-23 ENCOUNTER — Other Ambulatory Visit: Payer: Self-pay | Admitting: Family Medicine

## 2019-06-18 DIAGNOSIS — H25043 Posterior subcapsular polar age-related cataract, bilateral: Secondary | ICD-10-CM | POA: Diagnosis not present

## 2019-06-18 DIAGNOSIS — H2513 Age-related nuclear cataract, bilateral: Secondary | ICD-10-CM | POA: Diagnosis not present

## 2019-06-18 DIAGNOSIS — H2512 Age-related nuclear cataract, left eye: Secondary | ICD-10-CM | POA: Diagnosis not present

## 2019-06-18 DIAGNOSIS — E119 Type 2 diabetes mellitus without complications: Secondary | ICD-10-CM | POA: Diagnosis not present

## 2019-06-18 DIAGNOSIS — H25013 Cortical age-related cataract, bilateral: Secondary | ICD-10-CM | POA: Diagnosis not present

## 2019-06-27 NOTE — Progress Notes (Signed)
Patient ID: April Byrd, female    DOB: [birth year]  MRN: [id number]  CC: Diabetes follow-up  Subjective: April Byrd is a 44 y.o. female with history of essential hypertension, diabetes, osteoarthritis, vulvovaginal candidiasis, abnormal uterine bleeding, asthma, and BMI 37.0 - 37.9 who presents for diabetes follow-up.  1. DIABETES TYPE 2 FOLLOW-UP: Are you fasting today: yes  Have you taken any anti-diabetic medications today: no Last A1C: 11.7, 06/28/2019 Med Adherence:  [x] Yes    [] No Medication side effects:  [x] Yes, Metformin causes diarrhea  Home Monitoring?  [] Yes    [x] No  Home glucose results range: none Diet Adherence: [] Yes    [x] No Exercise: [] Yes    [x] No, because of asthma caused during exercise Hypoglycemic episodes?: [x]  No Numbness of the feet? [x] Yes, especially when laying down  Retinopathy hx? [] Yes    [x] No  Last eye exam: 3 weeks ago, upcoming surgery for bilateral cataracts, left eye 07/08/2019, right eye 08/05/2019   Comments: Last visit 08/30/2018 with Dr. Chapman Fitch. During that encounter Metformin and Amaryl prescribed and labs were collected.   Last visit 12/11/2018 with Dr. Loanne Drilling at [facility]. During that encounter patient was seen for discordance between A1C and reported CBGs and fructosamine was checked. Patient did not have follow-up with endocrinology.  Today reports she is not as thirsty as she once was now that she is taking Amaryl. Reports she does not feel excessively hungry. Reports urinating as normal.  Patient Active Problem List   Diagnosis Date Noted  . Diabetes ([place]) 12/11/2018  . Vulvovaginal candidiasis 10/10/2018  . Abnormal uterine bleeding (AUB) 10/10/2018  . Primary osteoarthritis of both feet 02/12/2018  . Primary osteoarthritis of both knees 02/12/2018  . Primary osteoarthritis of both hands 02/12/2018  . Unspecified essential hypertension 04/26/2012  . Unspecified asthma(493.90) 04/26/2012  . BMI  37.0-37.9,adult 04/26/2012     Current Outpatient Medications on File Prior to Visit  Medication Sig Dispense Refill  . Accu-Chek FastClix Lancets MISC USE AS DIRECTED ONCE DAILY TO  TEST  BLOOD  SUGAR 102 each 0  . acetaminophen (TYLENOL) 325 MG tablet Do not take more than 4000 mg of tylenol (acetaminophen) in any 24 hour period.  This is also in your prescribed pain medicine.    Marland Kitchen albuterol (PROVENTIL HFA;VENTOLIN HFA) 108 (90 Base) MCG/ACT inhaler Inhale 2 puffs into the lungs every 6 (six) hours as needed for wheezing or shortness of breath. 1 Inhaler 2  . Blood Glucose Monitoring Suppl (ACCU-CHEK AVIVA PLUS) w/Device KIT 1 each by Does not apply route 3 (three) times daily. (Patient taking differently: 1 each by Does not apply route daily. ) 1 kit 0  . fluconazole (DIFLUCAN) 150 MG tablet 1 tab po q72h x 3 doses. Then 1 tab po qweek x 6 months 30 tablet 0  . glimepiride (AMARYL) 2 MG tablet Take 1 tablet (2 mg total) by mouth daily before breakfast. Must eat before taking medication 30 tablet 3  . glucose blood (ACCU-CHEK GUIDE) test strip USE AS DIRECTED TO TEST BLOOD SUGAR ONCE DAILY (Patient taking differently: 1 each by Other route daily. ) 100 each 12  . metFORMIN (GLUCOPHAGE) 500 MG tablet Take 1 tablet (500 mg total) by mouth every morning. 90 tablet 3  . miconazole (MONISTAT 7) 2 % vaginal cream Place 1 Applicatorful vaginally at bedtime. Apply for seven nights 30 g 2   No current facility-administered medications on file  prior to visit.    Allergies  Allergen Reactions  . Penicillins Other (See Comments)    Has patient had a PCN reaction causing immediate rash, facial/tongue/throat swelling, SOB or lightheadedness with hypotension: Unknown Has patient had a PCN reaction causing severe rash involving mucus membranes or skin necrosis: Unknown Has patient had a PCN reaction that required hospitalization: Unknown Has patient had a PCN reaction occurring within the last 10 years:  No If all of the above answers are "NO", then may proceed with Cephalosporin use.     Social History   Socioeconomic History  . Marital status: Single    Spouse name: Not on file  . Number of children: Not on file  . Years of education: Not on file  . Highest education level: Not on file  Occupational History  . Not on file  Tobacco Use  . Smoking status: Never Smoker  . Smokeless tobacco: Never Used  Substance and Sexual Activity  . Alcohol use: Yes    Comment: social   . Drug use: No  . Sexual activity: Yes    Birth control/protection: Condom, Surgical  Other Topics Concern  . Not on file  Social History Narrative  . Not on file   Social Determinants of Health   Financial Resource Strain:   . Difficulty of Paying Living Expenses:   Food Insecurity:   . Worried About Charity fundraiser in the Last Year:   . Arboriculturist in the Last Year:   Transportation Needs:   . Film/video editor (Medical):   Marland Kitchen Lack of Transportation (Non-Medical):   Physical Activity:   . Days of Exercise per Week:   . Minutes of Exercise per Session:   Stress:   . Feeling of Stress :   Social Connections:   . Frequency of Communication with Friends and Family:   . Frequency of Social Gatherings with Friends and Family:   . Attends Religious Services:   . Active Member of Clubs or Organizations:   . Attends Archivist Meetings:   Marland Kitchen Marital Status:   Intimate Partner Violence:   . Fear of Current or Ex-Partner:   . Emotionally Abused:   Marland Kitchen Physically Abused:   . Sexually Abused:     Family History  Problem Relation Age of Onset  . Diabetes Mother   . Hypertension Mother   . Kidney disease Father   . Healthy Brother   . Healthy Son   . Healthy Son   . Healthy Son     Past Surgical History:  Procedure Laterality Date  . CESAREAN SECTION     x 3  . CHOLECYSTECTOMY N/A 04/25/2012   Procedure: LAPAROSCOPIC CHOLECYSTECTOMY WITH INTRAOPERATIVE CHOLANGIOGRAM;   Surgeon: Edward Jolly, MD;  Location: [facility];  Service: General;  Laterality: N/A;  . hydrocephalus surgery    . TUBAL LIGATION      ROS: Review of Systems Negative except as stated above  PHYSICAL EXAM: Vitals with BMI 06/28/2019 12/11/2018 10/10/2018  Height - 5' 4" -  Weight 221 lbs 220 lbs 10 oz 220 lbs 3 oz  BMI - 72.62 -  Systolic 035 [id number] 416  Diastolic 90 82 89  Pulse 92 103 85  SpO2- 97%, room air  Temperature- 32F, oral   Physical Exam General appearance - alert, well appearing, and in no distress and oriented to person, place, and time Mental status - alert, oriented to person, place, and time, normal mood, behavior, speech,  dress, motor activity, and thought processes Eyes - pupils equal and reactive, extraocular eye movements intact Ears - bilateral TM's and external ear canals normal Nose - normal and patent, no erythema, discharge or polyps and normal nontender sinuses Mouth - mucous membranes moist, pharynx normal without lesions Neck - supple, no significant adenopathy Lymphatics - no palpable lymphadenopathy, no hepatosplenomegaly Chest - clear to auscultation, no wheezes, rales or rhonchi, symmetric air entry, no tachypnea, retractions or cyanosis Heart - normal rate, regular rhythm, normal S1, S2, no murmurs, rubs, clicks or gallops Neurological - alert, oriented, normal speech, no focal findings or movement disorder noted, neck supple without rigidity, cranial nerves II through XII intact, funduscopic exam normal, discs flat and sharp, DTR's normal and symmetric, motor and sensory grossly normal bilaterally, normal muscle tone, no tremors, strength 5/5, Romberg sign negative, normal gait and station Musculoskeletal - no joint tenderness, deformity or swelling, no muscular tenderness noted Extremities - peripheral pulses normal, no pedal edema, no clubbing or cyanosis Skin - normal coloration and turgor, no rashes, no suspicious skin lesions noted  Results  for orders placed or performed in visit on 06/28/19  POCT glycosylated hemoglobin (Hb A1C)  Result Value Ref Range   Hemoglobin A1C     HbA1c POC (<> result, manual entry)     HbA1c, POC (prediabetic range)     HbA1c, POC (controlled diabetic range) 11.7 (A) 0.0 - 7.0 %  Glucose (CBG)  Result Value Ref Range   POC Glucose 368 (A) 70 - 99 mg/dl    ASSESSMENT AND PLAN: 1. Type 2 diabetes mellitus with hyperglycemia, without long-term current use of insulin ([place]): -A1C and glucose uncontrolled. Patient fasting and has not taken any anti-diabetic medications today. Patient asymptomatic.  -Continue Metformin as prescribed.  -Increase Glimepiride from 2 mg daily by mouth to 4 mg daily by mouth. -Patient reports that she is not interested in adding insulin at this time for diabetes management. Reports insulin is a synthetic product which she does not like and she also does not enjoy the idea of having to stick herself with insulin needles. -To achieve an A1C goal of less than or equal to 7.0 percent, a fasting blood sugar of 80 to 130 mg/dL and a postprandial glucose (90 to 120 minutes after a meal) less than 180 mg/dL. In the event of sugars less than 60 mg/dl or greater than 400 mg/dl please notify the clinic ASAP. It is recommended that you undergo annual eye exams and annual foot exams. -Discussed the importance of healthy eating habits, low-carbohydrate diet, low-sugar diet, regular aerobic exercise (at least 150 minutes a week as tolerated) and medication compliance to achieve or maintain control of diabetes. -Microalbumin / creatinine ratio to check kidney function.  -CMP to evaluate kidney function, liver function, and electrolyte balance. -Lipid panel to evaluate cholesterol level. -Follow-up in 1 month for diabetes check with clinical pharmacist.  -Follow-up in 3 months with primary physician for management of chronic issue. - POCT glycosylated hemoglobin (Hb A1C) - Glucose (CBG) -  CMP14+EGFR - Microalbumin / creatinine urine ratio - Lipid panel - glimepiride (AMARYL) 4 MG tablet; Take 1 tablet (4 mg total) by mouth daily before breakfast. Must eat before taking medication  Dispense: 30 tablet; Refill: 3 - metFORMIN (GLUCOPHAGE) 500 MG tablet; Take 1 tablet (500 mg total) by mouth every morning.  Dispense: 30 tablet; Refill: 3  2. Mild exercise-induced asthma: -Asthma stable. Patient only needs inhaler with exercise.  -Albuterol inhaler as needed  during exercise.  -Follow-up with primary physician as needed.  - albuterol (VENTOLIN HFA) 108 (90 Base) MCG/ACT inhaler; Inhale 2 puffs into the lungs every 6 (six) hours as needed for wheezing or shortness of breath.  Dispense: 8.5 g; Refill: 1   Patient was given the opportunity to ask questions.  Patient verbalized understanding of the plan and was able to repeat key elements of the plan. Patient was given clear instructions to go to Emergency Department or return to medical center if symptoms don't improve, worsen, or new problems develop.The patient verbalized understanding.   Camillia Herter, NP

## 2019-06-28 ENCOUNTER — Other Ambulatory Visit: Payer: Self-pay

## 2019-06-28 ENCOUNTER — Ambulatory Visit: Payer: Medicaid Other | Attending: Family | Admitting: Family

## 2019-06-28 ENCOUNTER — Encounter: Payer: Self-pay | Admitting: Family

## 2019-06-28 VITALS — BP 133/90 | HR 92 | Temp 97.0°F | Resp 16 | Wt 221.0 lb

## 2019-06-28 DIAGNOSIS — Z8249 Family history of ischemic heart disease and other diseases of the circulatory system: Secondary | ICD-10-CM | POA: Diagnosis not present

## 2019-06-28 DIAGNOSIS — I10 Essential (primary) hypertension: Secondary | ICD-10-CM | POA: Diagnosis not present

## 2019-06-28 DIAGNOSIS — Z880 Allergy status to penicillin: Secondary | ICD-10-CM | POA: Diagnosis not present

## 2019-06-28 DIAGNOSIS — E1165 Type 2 diabetes mellitus with hyperglycemia: Secondary | ICD-10-CM | POA: Insufficient documentation

## 2019-06-28 DIAGNOSIS — E1136 Type 2 diabetes mellitus with diabetic cataract: Secondary | ICD-10-CM | POA: Insufficient documentation

## 2019-06-28 DIAGNOSIS — M1990 Unspecified osteoarthritis, unspecified site: Secondary | ICD-10-CM | POA: Insufficient documentation

## 2019-06-28 DIAGNOSIS — Z79899 Other long term (current) drug therapy: Secondary | ICD-10-CM | POA: Insufficient documentation

## 2019-06-28 DIAGNOSIS — Z7984 Long term (current) use of oral hypoglycemic drugs: Secondary | ICD-10-CM | POA: Diagnosis not present

## 2019-06-28 DIAGNOSIS — E119 Type 2 diabetes mellitus without complications: Secondary | ICD-10-CM | POA: Diagnosis present

## 2019-06-28 DIAGNOSIS — J45990 Exercise induced bronchospasm: Secondary | ICD-10-CM | POA: Diagnosis not present

## 2019-06-28 DIAGNOSIS — Z841 Family history of disorders of kidney and ureter: Secondary | ICD-10-CM | POA: Insufficient documentation

## 2019-06-28 DIAGNOSIS — Z833 Family history of diabetes mellitus: Secondary | ICD-10-CM | POA: Insufficient documentation

## 2019-06-28 LAB — POCT GLYCOSYLATED HEMOGLOBIN (HGB A1C): HbA1c, POC (controlled diabetic range): 11.7 % — AB (ref 0.0–7.0)

## 2019-06-28 LAB — GLUCOSE, POCT (MANUAL RESULT ENTRY): POC Glucose: 368 mg/dl — AB (ref 70–99)

## 2019-06-28 MED ORDER — ALBUTEROL SULFATE HFA 108 (90 BASE) MCG/ACT IN AERS: 2.0000 | INHALATION_SPRAY | Freq: Four times a day (QID) | RESPIRATORY_TRACT | 1 refills | Status: DC | PRN

## 2019-06-28 MED ORDER — ACCU-CHEK AVIVA PLUS W/DEVICE KIT: 1.0000 | PACK | Freq: Three times a day (TID) | 0 refills | Status: DC

## 2019-06-28 MED ORDER — GLIMEPIRIDE 4 MG PO TABS: 4.0000 mg | ORAL_TABLET | Freq: Every day | ORAL | 3 refills | Status: DC

## 2019-06-28 MED ORDER — METFORMIN HCL 500 MG PO TABS: 500.0000 mg | ORAL_TABLET | ORAL | 3 refills | Status: DC

## 2019-06-28 NOTE — Patient Instructions (Addendum)
Continue Metformin for diabetes. Increase Amaryl and continue for diabetes. Labs today. Return in 1 month for diabetes check with clinical pharmacist. Return in 3 months or sooner if needed for management of chronic conditions with primary physician. Diabetes Basics  Diabetes (diabetes mellitus) is a long-term (chronic) disease. It occurs when the body does not properly use sugar (glucose) that is released from food after you eat. Diabetes may be caused by one or both of these problems:  Your pancreas does not make enough of a hormone called insulin.  Your body does not react in a normal way to insulin that it makes. Insulin lets sugars (glucose) go into cells in your body. This gives you energy. If you have diabetes, sugars cannot get into cells. This causes high blood sugar (hyperglycemia). Follow these instructions at home: How is diabetes treated? You may need to take insulin or other diabetes medicines daily to keep your blood sugar in balance. Take your diabetes medicines every day as told by your doctor. List your diabetes medicines here: Diabetes medicines  Name of medicine: ______________________________ ? Amount (dose): _______________ Time (a.m./p.m.): _______________ Notes: ___________________________________  Name of medicine: ______________________________ ? Amount (dose): _______________ Time (a.m./p.m.): _______________ Notes: ___________________________________  Name of medicine: ______________________________ ? Amount (dose): _______________ Time (a.m./p.m.): _______________ Notes: ___________________________________ If you use insulin, you will learn how to give yourself insulin by injection. You may need to adjust the amount based on the food that you eat. List the types of insulin you use here: Insulin  Insulin type: ______________________________ ? Amount (dose): _______________ Time (a.m./p.m.): _______________ Notes: ___________________________________  Insulin  type: ______________________________ ? Amount (dose): _______________ Time (a.m./p.m.): _______________ Notes: ___________________________________  Insulin type: ______________________________ ? Amount (dose): _______________ Time (a.m./p.m.): _______________ Notes: ___________________________________  Insulin type: ______________________________ ? Amount (dose): _______________ Time (a.m./p.m.): _______________ Notes: ___________________________________  Insulin type: ______________________________ ? Amount (dose): _______________ Time (a.m./p.m.): _______________ Notes: ___________________________________ How do I manage my blood sugar?  Check your blood sugar levels using a blood glucose monitor as directed by your doctor. Your doctor will set treatment goals for you. Generally, you should have these blood sugar levels:  Before meals (preprandial): 80-130 mg/dL (4.4-7.2 mmol/L).  After meals (postprandial): below 180 mg/dL (10 mmol/L).  A1c level: less than 7%. Write down the times that you will check your blood sugar levels: Blood sugar checks  Time: _______________ Notes: ___________________________________  Time: _______________ Notes: ___________________________________  Time: _______________ Notes: ___________________________________  Time: _______________ Notes: ___________________________________  Time: _______________ Notes: ___________________________________  Time: _______________ Notes: ___________________________________  What do I need to know about low blood sugar? Low blood sugar is called hypoglycemia. This is when blood sugar is at or below 70 mg/dL (3.9 mmol/L). Symptoms may include:  Feeling: ? Hungry. ? Worried or nervous (anxious). ? Sweaty and clammy. ? Confused. ? Dizzy. ? Sleepy. ? Sick to your stomach (nauseous).  Having: ? A fast heartbeat. ? A headache. ? A change in your vision. ? Tingling or no feeling (numbness) around the mouth,  lips, or tongue. ? Jerky movements that you cannot control (seizure).  Having trouble with: ? Moving (coordination). ? Sleeping. ? Passing out (fainting). ? Getting upset easily (irritability). Treating low blood sugar To treat low blood sugar, eat or drink something sugary right away. If you can think clearly and swallow safely, follow the 15:15 rule:  Take 15 grams of a fast-acting carb (carbohydrate). Talk with your doctor about how much you should take.  Some fast-acting carbs are: ? Sugar tablets (glucose  pills). Take 3-4 glucose pills. ? 6-8 pieces of hard candy. ? 4-6 oz (120-150 mL) of fruit juice. ? 4-6 oz (120-150 mL) of regular (not diet) soda. ? 1 Tbsp (15 mL) honey or sugar.  Check your blood sugar 15 minutes after you take the carb.  If your blood sugar is still at or below 70 mg/dL (3.9 mmol/L), take 15 grams of a carb again.  If your blood sugar does not go above 70 mg/dL (3.9 mmol/L) after 3 tries, get help right away.  After your blood sugar goes back to normal, eat a meal or a snack within 1 hour. Treating very low blood sugar If your blood sugar is at or below 54 mg/dL (3 mmol/L), you have very low blood sugar (severe hypoglycemia). This is an emergency. Do not wait to see if the symptoms will go away. Get medical help right away. Call your local emergency services (911 in the [place] Do not drive yourself to the hospital. Questions to ask your health care provider  Do I need to meet with a diabetes educator?  What equipment will I need to care for myself at home?  What diabetes medicines do I need? When should I take them?  How often do I need to check my blood sugar?  What number can I call if I have questions?  When is my next doctor's visit?  Where can I find a support group for people with diabetes? Where to find more information  American Diabetes Association: www.diabetes.org  American Association of Diabetes Educators:  www.diabeteseducator.org/patient-resources Contact a doctor if:  Your blood sugar is at or above 240 mg/dL (99.3 mmol/L) for 2 days in a row.  You have been sick or have had a fever for 2 days or more, and you are not getting better.  You have any of these problems for more than 6 hours: ? You cannot eat or drink. ? You feel sick to your stomach (nauseous). ? You throw up (vomit). ? You have watery poop (diarrhea). Get help right away if:  Your blood sugar is lower than 54 mg/dL (3 mmol/L).  You get confused.  You have trouble: ? Thinking clearly. ? Breathing. Summary  Diabetes (diabetes mellitus) is a long-term (chronic) disease. It occurs when the body does not properly use sugar (glucose) that is released from food after digestion.  Take insulin and diabetes medicines as told.  Check your blood sugar every day, as often as told.  Keep all follow-up visits as told by your doctor. This is important. This information is not intended to replace advice given to you by your health care provider. Make sure you discuss any questions you have with your health care provider. Document Revised: 10/03/2018 Document Reviewed: 04/14/2017 Elsevier Patient Education  2020 [employer].

## 2019-06-29 LAB — CMP14+EGFR
ALT: 12 IU/L (ref 0–32)
AST: 12 IU/L (ref 0–40)
Albumin/Globulin Ratio: 1.2 (ref 1.2–2.2)
Albumin: 4 g/dL (ref 3.8–4.8)
Alkaline Phosphatase: 117 IU/L (ref 48–121)
BUN/Creatinine Ratio: 12 (ref 9–23)
BUN: 10 mg/dL (ref 6–24)
Bilirubin Total: 0.4 mg/dL (ref 0.0–1.2)
CO2: 24 mmol/L (ref 20–29)
Calcium: 9.4 mg/dL (ref 8.7–10.2)
Chloride: 96 mmol/L (ref 96–106)
Creatinine, Ser: 0.84 mg/dL (ref 0.57–1.00)
GFR calc Af Amer: 98 mL/min/{1.73_m2} (ref >59)
GFR calc non Af Amer: 85 mL/min/{1.73_m2} (ref >59)
Globulin, Total: 3.4 g/dL (ref 1.5–4.5)
Glucose: 337 mg/dL — ABNORMAL HIGH (ref 65–99)
Potassium: 4.5 mmol/L (ref 3.5–5.2)
Sodium: 133 mmol/L — ABNORMAL LOW (ref 134–144)
Total Protein: 7.4 g/dL (ref 6.0–8.5)

## 2019-06-29 LAB — MICROALBUMIN / CREATININE URINE RATIO
Creatinine, Urine: 63.2 mg/dL
Microalb/Creat Ratio: 384 mg/g creat — ABNORMAL HIGH (ref 0–29)
Microalbumin, Urine: 242.8 ug/mL

## 2019-06-29 LAB — LIPID PANEL
Chol/HDL Ratio: 3 ratio (ref 0.0–4.4)
Cholesterol, Total: 179 mg/dL (ref 100–199)
HDL: 60 mg/dL (ref >39)
LDL Chol Calc (NIH): 97 mg/dL (ref 0–99)
Triglycerides: 123 mg/dL (ref 0–149)
VLDL Cholesterol Cal: 22 mg/dL (ref 5–40)

## 2019-06-30 NOTE — Progress Notes (Signed)
Please call patient with update.   Sodium sightly low but around the same since last year. Will recheck at next visit.  Microalbumin/creatinine ratio higher than normal. This measures kidney function in diabetic patients. Management of diabetes through diet, exercise, and medication adherence can hep improve this number. Will recheck at next visit.  Cholesterol normal.   A1C and CBG discussed in clinic.

## 2019-07-05 ENCOUNTER — Other Ambulatory Visit: Payer: Self-pay

## 2019-07-05 MED ORDER — ACCU-CHEK FASTCLIX LANCETS MISC: 0 refills | Status: AC

## 2019-07-05 MED ORDER — ALBUTEROL SULFATE HFA 108 (90 BASE) MCG/ACT IN AERS: 2.0000 | INHALATION_SPRAY | Freq: Four times a day (QID) | RESPIRATORY_TRACT | 2 refills | Status: DC | PRN

## 2019-07-05 MED ORDER — ACCU-CHEK GUIDE VI STRP: ORAL_STRIP | 11 refills | Status: DC

## 2019-07-05 MED ORDER — ACCU-CHEK AVIVA PLUS W/DEVICE KIT: 1.0000 | PACK | Freq: Three times a day (TID) | 0 refills | Status: DC

## 2019-07-08 ENCOUNTER — Other Ambulatory Visit: Payer: Self-pay | Admitting: Pharmacist

## 2019-07-08 DIAGNOSIS — H2512 Age-related nuclear cataract, left eye: Secondary | ICD-10-CM | POA: Diagnosis not present

## 2019-07-08 MED ORDER — ACCU-CHEK AVIVA PLUS W/DEVICE KIT: 1.0000 | PACK | Freq: Three times a day (TID) | 0 refills | Status: DC

## 2019-07-09 DIAGNOSIS — H2511 Age-related nuclear cataract, right eye: Secondary | ICD-10-CM | POA: Diagnosis not present

## 2019-07-15 DIAGNOSIS — H2513 Age-related nuclear cataract, bilateral: Secondary | ICD-10-CM | POA: Diagnosis not present

## 2019-07-30 ENCOUNTER — Other Ambulatory Visit: Payer: Self-pay

## 2019-07-30 ENCOUNTER — Ambulatory Visit: Payer: Medicaid Other | Attending: Family Medicine | Admitting: Pharmacist

## 2019-07-30 ENCOUNTER — Encounter: Payer: Self-pay | Admitting: Pharmacist

## 2019-07-30 DIAGNOSIS — E1165 Type 2 diabetes mellitus with hyperglycemia: Secondary | ICD-10-CM | POA: Diagnosis not present

## 2019-07-30 MED ORDER — LANTUS SOLOSTAR 100 UNIT/ML ~~LOC~~ SOPN: PEN_INJECTOR | SUBCUTANEOUS | 2 refills | Status: DC

## 2019-07-30 MED ORDER — TRUEPLUS PEN NEEDLES 32G X 4 MM MISC: 6 refills | Status: DC

## 2019-07-30 NOTE — Progress Notes (Signed)
S:    PCP: Dr. Jillyn Hidden No chief complaint on file.  Patient arrives in good spirits.  Presents for diabetes evaluation, education, and management. Patient was referred and last seen by Ricky Stabs on 06/28/2019.    Patient reports Diabetes was diagnosed ~21 years ago, initially as gestational. Reports she has been on [place] in the past and first used insulin 14 years ago. No hx of pancreatitis or thyroid cancer.   Family/Social History:  - FHx: DM, HTN, kidney disease  - Tobacco: never smoker  - Alcohol: Marketing executive affordability: [place] Medicaid  Medication adherence reported.   Current diabetes medications include: metformin 500 mg qAM, glimepiride 4 mg daily Current hypertension medications include: none  Current hyperlipidemia medications include: none   Patient denies hypoglycemic events.  Patient reported dietary habits:  - admits to regular soda intake - eats 2 main meals a day (breakfast, supper) - reports that she tries to limit carbs   Patient-reported exercise habits: - daily through work; ADLs - reports that she stopped taking daily walks in the neighborhood ~1 year ago.    Patient denies polyuria, polydipsia.  Patient reports a tingling sensation that occurs in her extremities at night.  Patient denies visual changes. Patient reports self foot exams.     O:   Lab Results  Component Value Date   HGBA1C 11.7 (A) 06/28/2019   There were no vitals filed for this visit.  Lipid Panel     Component Value Date/Time   CHOL 179 06/28/2019 0859   TRIG 123 06/28/2019 0859   HDL 60 06/28/2019 0859   CHOLHDL 3.0 06/28/2019 0859   LDLCALC 97 06/28/2019 0859   Home fasting blood sugars: 200-360.  30 day avg: 263 14 day avg: 286 7 day avg: 279   Clinical Atherosclerotic Cardiovascular Disease (ASCVD): No  The 10-year ASCVD risk score Denman George DC Jr., et al., 2013) is: 2.1%   Values used to calculate the score:     Age: 44 years      Sex: Female     Is Non-Hispanic African American: Yes     Diabetic: Yes     Tobacco smoker: No     Systolic Blood Pressure: 133 mmHg     Is BP treated: No     HDL Cholesterol: 60 mg/dL     Total Cholesterol: 179 mg/dL   A/P: Diabetes longstanding currently uncontrolled. Patient is able to verbalize appropriate hypoglycemia management plan. Medication adherence reported. She could not tolerate higher doses of metformin d/t GI side effects. Control is suboptimal due to dietary indiscretion. -Initiate Lantus 10 units daily. Increase by 2 units q3days until fasting CBGs are <150. Max = 20 units.  -Extensively discussed pathophysiology of diabetes, recommended lifestyle interventions, dietary effects on blood sugar control -Counseled on s/sx of and management of hypoglycemia -Next A1C anticipated 09/2019.   ASCVD risk - primary prevention in patient with diabetes. Last LDL is not optimally controlled. LDL goal <70. ASCVD risk score is not >20%  - moderate intensity statin indicated. She is not currently on a statin. Will discuss this with patient on follow-up.   Hypertension undiagnosed but pt with an abnormal urine microalbumin:SCr ratio. Discussed need to add low-dose lisinopril for kidney protection. She wants to think about this for now.   Written patient instructions provided.  Total time in face to face counseling 15 minutes.   Follow up Pharmacist Clinic Visit in 2-3 weeks.     Georgiana Shore  Patsey Berthold, PharmD, [place] [phone]

## 2019-08-20 ENCOUNTER — Ambulatory Visit: Payer: Medicaid Other | Admitting: Pharmacist

## 2019-10-08 DIAGNOSIS — H25041 Posterior subcapsular polar age-related cataract, right eye: Secondary | ICD-10-CM | POA: Diagnosis not present

## 2019-10-08 DIAGNOSIS — E113412 Type 2 diabetes mellitus with severe nonproliferative diabetic retinopathy with macular edema, left eye: Secondary | ICD-10-CM | POA: Diagnosis not present

## 2019-10-14 ENCOUNTER — Other Ambulatory Visit (HOSPITAL_COMMUNITY): Payer: Self-pay | Admitting: Ophthalmology

## 2019-10-14 DIAGNOSIS — E113211 Type 2 diabetes mellitus with mild nonproliferative diabetic retinopathy with macular edema, right eye: Secondary | ICD-10-CM

## 2019-10-15 ENCOUNTER — Ambulatory Visit: Payer: Medicaid Other | Attending: Internal Medicine | Admitting: Internal Medicine

## 2019-10-15 ENCOUNTER — Ambulatory Visit (HOSPITAL_COMMUNITY): Admission: RE | Admit: 2019-10-15 | Payer: Medicaid Other | Source: Ambulatory Visit

## 2019-10-15 DIAGNOSIS — Z794 Long term (current) use of insulin: Secondary | ICD-10-CM

## 2019-10-15 DIAGNOSIS — R809 Proteinuria, unspecified: Secondary | ICD-10-CM

## 2019-10-15 DIAGNOSIS — Z2821 Immunization not carried out because of patient refusal: Secondary | ICD-10-CM | POA: Diagnosis not present

## 2019-10-15 DIAGNOSIS — E1129 Type 2 diabetes mellitus with other diabetic kidney complication: Secondary | ICD-10-CM | POA: Diagnosis not present

## 2019-10-15 MED ORDER — METFORMIN HCL 500 MG PO TABS: 500.0000 mg | ORAL_TABLET | ORAL | 3 refills | Status: DC

## 2019-10-15 MED ORDER — LANTUS SOLOSTAR 100 UNIT/ML ~~LOC~~ SOPN: PEN_INJECTOR | SUBCUTANEOUS | 2 refills | Status: DC

## 2019-10-15 MED ORDER — GLIMEPIRIDE 4 MG PO TABS: 4.0000 mg | ORAL_TABLET | Freq: Every day | ORAL | 3 refills | Status: DC

## 2019-10-15 NOTE — Progress Notes (Signed)
Virtual Visit via Telephone Note Due to current restrictions/limitations of in-office visits due to the COVID-19 pandemic, this scheduled clinical appointment was converted to a telehealth visit  I connected with April Byrd on 10/15/19 at 9:04 a.m by telephone and verified that I am speaking with the correct person using two identifiers. I am in my office.  The patient is at home.  Only the patient, CMA [name] and myself participated in this encounter.  I discussed the limitations, risks, security and privacy concerns of performing an evaluation and management service by telephone and the availability of in person appointments. I also discussed with the patient that there may be a patient responsible charge related to this service. The patient expressed understanding and agreed to proceed.   History of Present Illness: Patient with history of DM, OA, obesity.  PCP is Dr. Chapman Fitch.  Patient was last seen 06/2019 by NP fellow Amy Minette Brine.  DIABETES TYPE 2 Last A1C:   Lab Results  Component Value Date   HGBA1C 11.7 (A) 06/28/2019   Med Adherence:  '[x]'  Yes -since last visit with the nurse practitioner.  Patient has seen the clinical pharmacist and was started on Lantus as blood sugars were not controlled on Metformin and Amaryl.  Currently on Lantus 20 units, Metformin 500 mg daily and Amaryl.  She is unable to tolerate higher dose of Metformin because it causes diarrhea. Medication side effects: Diarrhea if she takes more than 500 mg a day of Metformin Home Monitoring?  '[x]'  Yes - only 2 x a wk because she is busy with school Home glucose results range: 140-179.  I tried to clarify whether these readings were before meals or after meals.  He mentioned after but could not clarify how soon after meal she checks the blood sugars.  Overall however, she states the blood sugars are better as previously they were in the 300s.    Diet Adherence: eating less.  "More of a picker." Exercise:  Not getting  much exercise other than carrying out her routine daily activities. Hypoglycemic episodes?: '[]'  Yes    '[x]'  No Numbness of the feet? '[]'  Yes    '[]'  No Retinopathy hx? '[]'  Yes    '[x]'  No Last eye exam:  Schedule to have cataract extraction RT eye 12/2019.  Had cataract extraction LT eye 06/2019. Seeing much better out of LT eye.  Comments: On her last visit, she had microalbumin urea with range in the 300. This was higher than the previous year.  HM:  Declines flu shot and COVID-19 vaccine.  Current Outpatient Medications on File Prior to Visit  Medication Sig Dispense Refill  . 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 acetaminophen (TYLENOL) 325 MG tablet Do not take more than 4000 mg of tylenol (acetaminophen) in any 24 hour period.  This is also in your prescribed pain medicine.    Marland Kitchen albuterol (PROAIR HFA) 108 (90 Base) MCG/ACT inhaler Inhale 2 puffs into the lungs every 6 (six) hours as needed for wheezing or shortness of breath. 8.5 g 2  . Blood Glucose Monitoring Suppl (ACCU-CHEK AVIVA PLUS) w/Device KIT 1 each by Does not apply route 3 (three) times daily. Use to check blood sugars 3 times daily. DXE11.65 1 kit 0  . fluconazole (DIFLUCAN) 150 MG tablet 1 tab po q72h x 3 doses. Then 1 tab po qweek x 6 months (Patient not taking: Reported on 10/15/2019) 30  tablet 0  . glimepiride (AMARYL) 4 MG tablet Take 1 tablet (4 mg total) by mouth daily before breakfast. Must eat before taking medication 30 tablet 3  . glucose blood (ACCU-CHEK GUIDE) test strip Test 3 times daily DxE11.65 100 each 11  . insulin glargine (LANTUS SOLOSTAR) 100 UNIT/ML Solostar Pen Inject 10 units Liberty at bedtime. Increase by 2 units q3days if CBGs >150 in the morning. Max = 20 units. 15 mL 2  . Insulin Pen Needle (TRUEPLUS PEN NEEDLES) 32G X 4 MM MISC Use to inject insulin once daily. 100 each 6  . metFORMIN (GLUCOPHAGE) 500 MG tablet Take 1 tablet (500 mg total) by  mouth every morning. 30 tablet 3  . miconazole (MONISTAT 7) 2 % vaginal cream Place 1 Applicatorful vaginally at bedtime. Apply for seven nights 30 g 2   No current facility-administered medications on file prior to visit.      Observations/Objective: Results for orders placed or performed in visit on 06/28/19  CMP14+EGFR  Result Value Ref Range   Glucose 337 (H) 65 - 99 mg/dL   BUN 10 6 - 24 mg/dL   Creatinine, Ser 0.84 0.57 - 1.00 mg/dL   GFR calc non Af Amer 85 >59 mL/min/1.73   GFR calc Af Amer 98 >59 mL/min/1.73   BUN/Creatinine Ratio 12 9 - 23   Sodium 133 (L) 134 - 144 mmol/L   Potassium 4.5 3.5 - 5.2 mmol/L   Chloride 96 96 - 106 mmol/L   CO2 24 20 - 29 mmol/L   Calcium 9.4 8.7 - 10.2 mg/dL   Total Protein 7.4 6.0 - 8.5 g/dL   Albumin 4.0 3.8 - 4.8 g/dL   Globulin, Total 3.4 1.5 - 4.5 g/dL   Albumin/Globulin Ratio 1.2 1.2 - 2.2   Bilirubin Total 0.4 0.0 - 1.2 mg/dL   Alkaline Phosphatase 117 48 - 121 IU/L   AST 12 0 - 40 IU/L   ALT 12 0 - 32 IU/L  Microalbumin / creatinine urine ratio  Result Value Ref Range   Creatinine, Urine 63.2 Not Estab. mg/dL   Microalbumin, Urine 242.8 Not Estab. ug/mL   Microalb/Creat Ratio 384 (H) 0 - 29 mg/g creat  Lipid panel  Result Value Ref Range   Cholesterol, Total 179 100 - 199 mg/dL   Triglycerides 123 0 - 149 mg/dL   HDL 60 >39 mg/dL   VLDL Cholesterol Cal 22 5 - 40 mg/dL   LDL Chol Calc (NIH) 97 0 - 99 mg/dL   Chol/HDL Ratio 3.0 0.0 - 4.4 ratio  POCT glycosylated hemoglobin (Hb A1C)  Result Value Ref Range   Hemoglobin A1C     HbA1c POC (<> result, manual entry)     HbA1c, POC (prediabetic range)     HbA1c, POC (controlled diabetic range) 11.7 (A) 0.0 - 7.0 %  Glucose (CBG)  Result Value Ref Range   POC Glucose 368 (A) 70 - 99 mg/dl     Assessment and Plan: 1. Type 2 diabetes mellitus with microalbuminuria, with long-term current use of insulin (HCC) -Commended her on improvement in her blood sugars. Encouraged  her to check blood sugars at least once a day sometimes before breakfast and other times before dinner and keep a log. Encourage her to get in some form of regular exercise at least 3 to 4 days a week for 30 minutes. -We discussed the macroalbumin urea. Hopefully with her blood sugars improving, the amount of protein in the urine has decreased. If it  has not, I informed her of my usual recommendation to start a low-dose of lisinopril or Cozaar to help protect the kidneys. - Hemoglobin A1c; Future - metFORMIN (GLUCOPHAGE) 500 MG tablet; Take 1 tablet (500 mg total) by mouth every morning.  Dispense: 30 tablet; Refill: 3 - glimepiride (AMARYL) 4 MG tablet; Take 1 tablet (4 mg total) by mouth daily before breakfast. Must eat before taking medication  Dispense: 30 tablet; Refill: 3 - insulin glargine (LANTUS SOLOSTAR) 100 UNIT/ML Solostar Pen; 20 units subcut daily at bedtime.  Dispense: 15 mL; Refill: 2 - Microalbumin / creatinine urine ratio; Future  2. Declined flu vaccine  3. Declined COVID-19 vaccine  Follow Up Instructions: F/u with Dr. Chapman Fitch in 3 mths   I discussed the assessment and treatment plan with the patient. The patient was provided an opportunity to ask questions and all were answered. The patient agreed with the plan and demonstrated an understanding of the instructions.   The patient was advised to call back or seek an in-person evaluation if the symptoms worsen or if the condition fails to improve as anticipated.  I provided 12  minutes of non-face-to-face time during this encounter.   Karle Plumber, MD

## 2019-10-16 ENCOUNTER — Other Ambulatory Visit: Payer: Self-pay | Admitting: Obstetrics and Gynecology

## 2019-10-16 ENCOUNTER — Ambulatory Visit: Payer: Medicaid Other | Attending: Family Medicine

## 2019-10-16 ENCOUNTER — Other Ambulatory Visit: Payer: Self-pay

## 2019-10-16 DIAGNOSIS — Z794 Long term (current) use of insulin: Secondary | ICD-10-CM | POA: Diagnosis not present

## 2019-10-16 DIAGNOSIS — R809 Proteinuria, unspecified: Secondary | ICD-10-CM | POA: Diagnosis not present

## 2019-10-16 DIAGNOSIS — E1129 Type 2 diabetes mellitus with other diabetic kidney complication: Secondary | ICD-10-CM | POA: Diagnosis not present

## 2019-10-17 LAB — MICROALBUMIN / CREATININE URINE RATIO
Creatinine, Urine: 117.3 mg/dL
Microalb/Creat Ratio: 255 mg/g creat — ABNORMAL HIGH (ref 0–29)
Microalbumin, Urine: 298.7 ug/mL

## 2019-10-17 LAB — HEMOGLOBIN A1C
Est. average glucose Bld gHb Est-mCnc: 252 mg/dL
Hgb A1c MFr Bld: 10.4 % — ABNORMAL HIGH (ref 4.8–5.6)

## 2019-10-21 ENCOUNTER — Telehealth: Payer: Medicaid Other | Admitting: Family

## 2019-10-21 DIAGNOSIS — R109 Unspecified abdominal pain: Secondary | ICD-10-CM

## 2019-10-21 DIAGNOSIS — R102 Pelvic and perineal pain: Secondary | ICD-10-CM

## 2019-10-21 NOTE — Progress Notes (Signed)
Based on what you shared with me, I feel your condition warrants further evaluation and I recommend that you be seen for a face to face office visit.  Given you are having abdominal pain, this is not a typical symptom of a vaginal yeast infection. It would be best to be seen face to face.    NOTE: If you entered your credit card information for this eVisit, you will not be charged. You may see a "hold" on your card for the $35 but that hold will drop off and you will not have a charge processed.   If you are having a true medical emergency please call 911.      For an urgent face to face visit, [facility] has five urgent care centers for your convenience:      NEW:  [facility] at [facility] Directions [phone] [address] [place], [place] [address] . 10 am - 6pm Monday - Friday    [facility] [address] [facility]) Get Driving Directions [phone] [address] [place], [place] [address] . 10 am to 8 pm Monday-Friday . 12 pm to 8 pm [facility] Urgent Care at [facility] Get Driving Directions [phone] [address] [place], [place] [address] . 8 am to 8 pm Monday-Friday . 9 am to 6 pm Saturday . 11 am to 6 pm Sunday     [facility] Urgent [facility] at [facility] Get Driving Directions  [phone] [address] [place], [place] [address] . 8 am to 8 pm Monday-Friday . 8 am to 4 pm [facility] Urgent Care at [facility] Directions [phone] [address] [place], [place] [address] . 12 pm to 6 pm Monday-Friday      Your e-visit answers were reviewed by a board certified advanced clinical practitioner to complete your personal care plan.  Thank you for using e-Visits.

## 2019-10-25 ENCOUNTER — Ambulatory Visit (HOSPITAL_COMMUNITY)
Admission: RE | Admit: 2019-10-25 | Discharge: 2019-10-25 | Disposition: A | Discharge status: Home / Self Care | Payer: Medicaid Other | Source: Ambulatory Visit | Attending: Cardiovascular Disease | Admitting: Cardiovascular Disease

## 2019-10-25 ENCOUNTER — Other Ambulatory Visit: Payer: Self-pay

## 2019-10-25 DIAGNOSIS — E113211 Type 2 diabetes mellitus with mild nonproliferative diabetic retinopathy with macular edema, right eye: Secondary | ICD-10-CM | POA: Insufficient documentation

## 2019-11-20 DIAGNOSIS — H25041 Posterior subcapsular polar age-related cataract, right eye: Secondary | ICD-10-CM | POA: Diagnosis not present

## 2019-11-20 DIAGNOSIS — H2511 Age-related nuclear cataract, right eye: Secondary | ICD-10-CM | POA: Diagnosis not present

## 2019-11-20 DIAGNOSIS — H25811 Combined forms of age-related cataract, right eye: Secondary | ICD-10-CM | POA: Diagnosis not present

## 2019-12-23 ENCOUNTER — Other Ambulatory Visit: Payer: Self-pay | Admitting: Obstetrics and Gynecology

## 2019-12-23 DIAGNOSIS — Z1231 Encounter for screening mammogram for malignant neoplasm of breast: Secondary | ICD-10-CM

## 2019-12-27 ENCOUNTER — Ambulatory Visit
Admission: RE | Admit: 2019-12-27 | Discharge: 2019-12-27 | Disposition: A | Discharge status: Home / Self Care | Payer: Medicaid Other | Source: Ambulatory Visit | Attending: Obstetrics and Gynecology | Admitting: Obstetrics and Gynecology

## 2019-12-27 ENCOUNTER — Other Ambulatory Visit: Payer: Self-pay

## 2019-12-27 DIAGNOSIS — Z1231 Encounter for screening mammogram for malignant neoplasm of breast: Secondary | ICD-10-CM

## 2020-01-02 ENCOUNTER — Ambulatory Visit: Payer: Self-pay

## 2020-01-02 NOTE — Telephone Encounter (Signed)
Patient called back after triage this AM.  She needed to know if she would get an appointment, She has been able to get a BP reading  It was 131/90.  She states her Headache is returning. I called office and spoke with Mallory. No appointment today for work in. Patient was instructed to go to UC for evaluation of nqw onset hypertension. She was also instructed to call back for appointment with PCP for follow up.  She verbalized understanding.  Reason for Disposition . Requesting regular office appointment  Answer Assessment - Initial Assessment Questions 1. REASON FOR CALL or QUESTION: "What is your reason for calling today?" or "How can I best help you?" or "What question do you have that I can help answer?"     Patient called back after triage this AM.  She needed to know if she would get an appointment,  Protocols used: INFORMATION ONLY CALL - NO TRIAGE-A-AH

## 2020-01-02 NOTE — Telephone Encounter (Signed)
Pt. Reports she started having headaches this week. In front and her eyes. Comes and goes. Tylenol does not help. States "I took a blood pressure from someone yesterday, and my headache went away." No headache today. Blood sugar has been in "good range." States mother has high BP. Spoke with Gaylin in the practice. Please advise pt.  Reason for Disposition . [1] MILD-MODERATE headache AND [2] present > 72 hours  Answer Assessment - Initial Assessment Questions 1. LOCATION: "Where does it hurt?"      Hurts front and eyes 2. ONSET: "When did the headache start?" (Minutes, hours or days)      This week 3. PATTERN: "Does the pain come and go, or has it been constant since it started?"     Comes and goes 4. SEVERITY: "How bad is the pain?" and "What does it keep you from doing?"  (e.g., Scale 1-10; mild, moderate, or severe)   - MILD (1-3): doesn't interfere with normal activities    - MODERATE (4-7): interferes with normal activities or awakens from sleep    - SEVERE (8-10): excruciating pain, unable to do any normal activities        Yesterday - 12 5. RECURRENT SYMPTOM: "Have you ever had headaches before?" If Yes, ask: "When was the last time?" and "What happened that time?"      Yes 6. CAUSE: "What do you think is causing the headache?"     BP 7. MIGRAINE: "Have you been diagnosed with migraine headaches?" If Yes, ask: "Is this headache similar?"      No 8. HEAD INJURY: "Has there been any recent injury to the head?"      No 9. OTHER SYMPTOMS: "Do you have any other symptoms?" (fever, stiff neck, eye pain, sore throat, cold symptoms)     No 10. PREGNANCY: "Is there any chance you are pregnant?" "When was your last menstrual period?"       No  Protocols used: HEADACHE-A-AH

## 2020-01-02 NOTE — Telephone Encounter (Signed)
Previous Triage : Patient will go to UC for evaluation for headache and high BP Now 131/90. She will call back for follow up with provider for new onset hypertension

## 2020-01-08 ENCOUNTER — Other Ambulatory Visit: Payer: Self-pay

## 2020-01-08 ENCOUNTER — Ambulatory Visit: Payer: Medicaid Other | Admitting: Physician Assistant

## 2020-01-08 VITALS — BP 113/79 | HR 90 | Temp 97.2°F | Resp 18 | Ht 64.0 in | Wt 221.0 lb

## 2020-01-08 DIAGNOSIS — G43001 Migraine without aura, not intractable, with status migrainosus: Secondary | ICD-10-CM | POA: Diagnosis not present

## 2020-01-08 DIAGNOSIS — R030 Elevated blood-pressure reading, without diagnosis of hypertension: Secondary | ICD-10-CM | POA: Diagnosis not present

## 2020-01-08 NOTE — Progress Notes (Signed)
Patient has eaten today and patient has taken medication today Patient denies pain at this time. Patient denies HA, N/V or dizziness today.

## 2020-01-08 NOTE — Progress Notes (Signed)
Established Patient Office Visit  Subjective:  Patient ID: April Byrd, female    DOB: 05-05-1975  Age: 44 y.o. MRN: [id number]  CC:  Chief Complaint  Patient presents with  . Hypertension    HPI Ngan Qualls reports that she had a severe headache 4 days ago, states it lasted approximately 24 hours, felt frontal pain, photophobia, very low energy.  Reports that she used Tylenol without relief.  Reports that she became scared and called for an ambulance.  Reports that the ambulance gave her reassurance as she did not travel to the emergency department.  Reports that her friend came over, checked her blood pressure, stated that it was elevated and gave her a blood pressure pill which she believes is lisinopril, she is unsure of the dose.  Reports that her blood pressure reading was 148/97, repeat reading was 161, unsure of bottom number.  Reports that her headache did start to resolve.  Denies nausea or vomiting  Reports that she has also been taking a baby aspirin on a daily basis.  Endorses increased stress level, states that this was present prior to her headache.  Denies any history of migraine headaches  Reports that she has not continue to check her blood pressure at home, but did take another dose of lisinopril today.  States this was her second dose.      Past Medical History:  Diagnosis Date  . Asthma    sports induced  . BMI 37.0-37.9,adult 04/26/2012  . Calculus of gallbladder with acute cholecystitis 04/26/2012  . Calculus of gallbladder with acute cholecystitis, without mention of obstruction 04/26/2012  . Type II or unspecified type diabetes mellitus without mention of complication, not stated as uncontrolled 04/26/2012  . Unspecified asthma(493.90) 04/26/2012  . Unspecified essential hypertension 04/26/2012    Past Surgical History:  Procedure Laterality Date  . CESAREAN SECTION     x 3  . CHOLECYSTECTOMY N/A 04/25/2012   Procedure: LAPAROSCOPIC CHOLECYSTECTOMY WITH  INTRAOPERATIVE CHOLANGIOGRAM;  Surgeon: Edward Jolly, MD;  Location: [facility];  Service: General;  Laterality: N/A;  . hydrocephalus surgery    . TUBAL LIGATION      Family History  Problem Relation Age of Onset  . Diabetes Mother   . Hypertension Mother   . Kidney disease Father   . Healthy Brother   . Healthy Son   . Healthy Son   . Healthy Son     Social History   Socioeconomic History  . Marital status: Single    Spouse name: Not on file  . Number of children: Not on file  . Years of education: Not on file  . Highest education level: Not on file  Occupational History  . Not on file  Tobacco Use  . Smoking status: Never Smoker  . Smokeless tobacco: Never Used  Vaping Use  . Vaping Use: Never used  Substance and Sexual Activity  . Alcohol use: Yes    Comment: social   . Drug use: No  . Sexual activity: Yes    Birth control/protection: Condom, Surgical  Other Topics Concern  . Not on file  Social History Narrative  . Not on file   Social Determinants of Health   Financial Resource Strain: Not on file  Food Insecurity: Not on file  Transportation Needs: Not on file  Physical Activity: Not on file  Stress: Not on file  Social Connections: Not on file  Intimate Partner Violence: Not on file    Outpatient Medications Prior  to Visit  Medication Sig Dispense Refill  . 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 acetaminophen (TYLENOL) 325 MG tablet Do not take more than 4000 mg of tylenol (acetaminophen) in any 24 hour period.  This is also in your prescribed pain medicine.    Marland Kitchen albuterol (PROAIR HFA) 108 (90 Base) MCG/ACT inhaler Inhale 2 puffs into the lungs every 6 (six) hours as needed for wheezing or shortness of breath. 8.5 g 2  . Blood Glucose Monitoring Suppl (ACCU-CHEK AVIVA PLUS) w/Device KIT 1 each by Does not apply route 3 (three) times daily. Use to check blood sugars 3 times daily.  DXE11.65 1 kit 0  . glimepiride (AMARYL) 4 MG tablet Take 1 tablet (4 mg total) by mouth daily before breakfast. Must eat before taking medication 30 tablet 3  . glucose blood (ACCU-CHEK GUIDE) test strip Test 3 times daily DxE11.65 100 each 11  . insulin glargine (LANTUS SOLOSTAR) 100 UNIT/ML Solostar Pen 20 units subcut daily at bedtime. 15 mL 2  . Insulin Pen Needle (TRUEPLUS PEN NEEDLES) 32G X 4 MM MISC Use to inject insulin once daily. 100 each 6  . metFORMIN (GLUCOPHAGE) 500 MG tablet Take 1 tablet (500 mg total) by mouth every morning. 30 tablet 3  . miconazole (MONISTAT 7) 2 % vaginal cream Place 1 Applicatorful vaginally at bedtime. Apply for seven nights 30 g 2   No facility-administered medications prior to visit.    Allergies  Allergen Reactions  . Penicillins Other (See Comments)    Has patient had a PCN reaction causing immediate rash, facial/tongue/throat swelling, SOB or lightheadedness with hypotension: Unknown Has patient had a PCN reaction causing severe rash involving mucus membranes or skin necrosis: Unknown Has patient had a PCN reaction that required hospitalization: Unknown Has patient had a PCN reaction occurring within the last 10 years: No If all of the above answers are "NO", then may proceed with Cephalosporin use.     ROS Review of Systems  Constitutional: Negative.   HENT: Negative.   Eyes: Negative.   Respiratory: Negative for shortness of breath and wheezing.   Cardiovascular: Negative for chest pain and palpitations.  Gastrointestinal: Negative.   Endocrine: Negative.   Genitourinary: Negative.   Musculoskeletal: Negative.   Skin: Negative.   Allergic/Immunologic: Negative.   Neurological: Positive for headaches. Negative for speech difficulty and weakness.  Hematological: Negative.   Psychiatric/Behavioral: Negative.       Objective:    Physical Exam Vitals and nursing note reviewed.   BP 113/79 (BP Location: Left Arm, Patient Position:  Sitting, Cuff Size: Large)   Pulse 90   Temp (!) 97.2 F (36.2 C) (Oral)   Resp 18   Ht '5\' 4"'  (1.626 m)   Wt 221 lb (100.2 kg)   LMP 12/16/2019   SpO2 98%   BMI 37.93 kg/m   General Appearance:    Alert, cooperative, no distress, appears stated age  Head:    Normocephalic, without obvious abnormality, atraumatic  Eyes:    PERRL, conjunctiva/corneas clear, EOM's intact, fundi    benign, both eyes  Ears:    Normal TM's and external ear canals, both ears  Nose:   Nares normal, septum midline, mucosa normal, no drainage    or sinus tenderness  Throat:   Lips, mucosa, and tongue normal; teeth and gums normal  Neck:   Supple, symmetrical, trachea midline, no adenopathy;  thyroid:  no enlargement/tenderness/nodules; no carotid   bruit or JVD  Back:     Symmetric, no curvature, ROM normal, no CVA tenderness  Lungs:     Clear to auscultation bilaterally, respirations unlabored  Chest Wall:    No tenderness or deformity   Heart:    Regular rate and rhythm, S1 and S2 normal, no murmur, rub   or gallop  Breast Exam:    No tenderness, masses, or nipple abnormality           Extremities:   Extremities normal, atraumatic, no cyanosis or edema  Pulses:   2+ and symmetric all extremities  Skin:   Skin color, texture, turgor normal, no rashes or lesions  Lymph nodes:   Cervical, supraclavicular, and axillary nodes normal  Neurologic:   CNII-XII intact, normal strength, sensation and reflexes    throughout    BP 113/79 (BP Location: Left Arm, Patient Position: Sitting, Cuff Size: Large)   Pulse 90   Temp (!) 97.2 F (36.2 C) (Oral)   Resp 18   Ht '5\' 4"'  (1.626 m)   Wt 221 lb (100.2 kg)   LMP 12/16/2019   SpO2 98%   BMI 37.93 kg/m  Wt Readings from Last 3 Encounters:  01/08/20 221 lb (100.2 kg)  06/28/19 221 lb (100.2 kg)  12/11/18 220 lb 9.6 oz (100.1 kg)     Health Maintenance Due  Topic Date Due  . Hepatitis C Screening  Never done  . COVID-19 Vaccine (1) Never done  .  FOOT EXAM  Never done  . OPHTHALMOLOGY EXAM  Never done  . TETANUS/TDAP  Never done    There are no preventive care reminders to display for this patient.  Lab Results  Component Value Date   TSH 1.200 01/08/2020   Lab Results  Component Value Date   WBC 4.4 01/08/2020   HGB 12.7 01/08/2020   HCT 38.9 01/08/2020   MCV 82 01/08/2020   PLT 250 01/08/2020   Lab Results  Component Value Date   NA 135 01/08/2020   K 3.9 01/08/2020   CO2 24 06/28/2019   GLUCOSE 281 (H) 01/08/2020   BUN 10 01/08/2020   CREATININE 0.79 01/08/2020   BILITOT 0.3 01/08/2020   ALKPHOS 100 01/08/2020   AST 12 01/08/2020   ALT 12 06/28/2019   PROT 7.4 01/08/2020   ALBUMIN 3.8 01/08/2020   CALCIUM 9.2 01/08/2020   ANIONGAP 11 08/10/2017   Lab Results  Component Value Date   CHOL 179 06/28/2019   Lab Results  Component Value Date   HDL 60 06/28/2019   Lab Results  Component Value Date   LDLCALC 97 06/28/2019   Lab Results  Component Value Date   TRIG 123 06/28/2019   Lab Results  Component Value Date   CHOLHDL 3.0 06/28/2019   Lab Results  Component Value Date   HGBA1C 10.4 (H) 10/16/2019      Assessment & Plan:   Problem List Items Addressed This Visit   None   Visit Diagnoses    Elevated blood pressure reading in office without diagnosis of hypertension    -  Primary   Relevant Orders   TSH (Completed)   CBC with Differential/Platelet (Completed)   Comp. Metabolic Panel (12) (Completed)   Migraine without aura and with status migrainosus, not intractable        1. Elevated blood pressure reading in office without diagnosis of hypertension Patient education given on dangers of using other peoples prescription  medication.  Patient encouraged to check blood pressure on a daily basis, keep a written log, and take it to your office visit with Juluis Mire, NP on January 15, 2020. - TSH - CBC with Differential/Platelet - Comp. Metabolic Panel (12)  2. Migraine without  aura and with status migrainosus, not intractable Currently resolved, encouraged patient to increase hydration, keep diary of headaches.    I have reviewed the patient's medical history (PMH, PSH, Social History, Family History, Medications, and allergies) , and have been updated if relevant. I spent 30 minutes reviewing chart and  face to face time with patient.    No orders of the defined types were placed in this encounter.   Follow-up: Return in about 1 week (around 01/15/2020) for with Juluis Mire .    Loraine Grip Mayers, PA-C

## 2020-01-08 NOTE — Patient Instructions (Signed)
I encourage you to check your blood pressure 1-2 times a day, keep a written log and take that to your office visit next week.  We will call you with your lab results  Roney Jaffe, PA-C Physician Assistant [facility] [email or web address]    Blood Pressure Record Sheet To take your blood pressure, you will need a blood pressure machine. You can buy a blood pressure machine (blood pressure monitor) at your clinic, drug store, or online. When choosing one, consider:  An automatic monitor that has an arm cuff.  A cuff that wraps snugly around your upper arm. You should be able to fit only one finger between your arm and the cuff.  A device that stores blood pressure reading results.  Do not choose a monitor that measures your blood pressure from your wrist or finger. Follow your health care provider's instructions for how to take your blood pressure. To use this form:  Get one reading in the morning (a.m.) before you take any medicines.  Get one reading in the evening (p.m.) before supper.  Take at least 2 readings with each blood pressure check. This makes sure the results are correct. Wait 1-2 minutes between measurements.  Write down the results in the spaces on this form.  Repeat this once a week, or as told by your health care provider.  Make a follow-up appointment with your health care provider to discuss the results. Blood pressure log Date: _______________________  a.m. _____________________(1st reading) _____________________(2nd reading)  p.m. _____________________(1st reading) _____________________(2nd reading) Date: _______________________  a.m. _____________________(1st reading) _____________________(2nd reading)  p.m. _____________________(1st reading) _____________________(2nd reading) Date: _______________________  a.m. _____________________(1st reading) _____________________(2nd reading)  p.m.  _____________________(1st reading) _____________________(2nd reading) Date: _______________________  a.m. _____________________(1st reading) _____________________(2nd reading)  p.m. _____________________(1st reading) _____________________(2nd reading) Date: _______________________  a.m. _____________________(1st reading) _____________________(2nd reading)  p.m. _____________________(1st reading) _____________________(2nd reading) This information is not intended to replace advice given to you by your health care provider. Make sure you discuss any questions you have with your health care provider.   How to Take Your Blood Pressure Blood pressure is a measurement of how strongly your blood is pressing against the walls of your arteries. Arteries are blood vessels that carry blood from your heart throughout your body. Your health care provider takes your blood pressure at each office visit. You can also take your own blood pressure at home with a blood pressure machine. You may need to take your own blood pressure:  To confirm a diagnosis of high blood pressure (hypertension).  To monitor your blood pressure over time.  To make sure your blood pressure medicine is working. Supplies needed: To take your blood pressure, you will need a blood pressure machine. You can buy a blood pressure machine, or blood pressure monitor, at most drugstores or online. There are several types of home blood pressure monitors. When choosing one, consider the following:  Choose a monitor that has an arm cuff.  Choose a cuff that wraps snugly around your upper arm. You should be able to fit only one finger between your arm and the cuff.  Do not choose a monitor that measures your blood pressure from your wrist or finger. Your health care provider can suggest a reliable monitor that will meet your needs. How to prepare To get the most accurate reading, avoid the following for 30 minutes before you check your  blood pressure:  Drinking caffeine.  Drinking alcohol.  Eating.  Smoking.  Exercising. Five minutes before  you check your blood pressure:  Empty your bladder.  Sit quietly without talking in a dining chair, rather than in a soft couch or armchair. How to take your blood pressure To check your blood pressure, follow the instructions in the manual that came with your blood pressure monitor. If you have a digital blood pressure monitor, the instructions may be as follows: 1. Sit up straight. 2. Place your feet on the floor. Do not cross your ankles or legs. 3. Rest your left arm at the level of your heart on a table or desk or on the arm of a chair. 4. Pull up your shirt sleeve. 5. Wrap the blood pressure cuff around the upper part of your left arm, 1 inch (2.5 cm) above your elbow. It is best to wrap the cuff around bare skin. 6. Fit the cuff snugly around your arm. You should be able to place only one finger between the cuff and your arm. 7. Position the cord inside the groove of your elbow. 8. Press the power button. 9. Sit quietly while the cuff inflates and deflates. 10. Read the digital reading on the monitor screen and write it down (record it). 11. Wait 2-3 minutes, then repeat the steps, starting at step 1. What does my blood pressure reading mean? A blood pressure reading consists of a higher number over a lower number. Ideally, your blood pressure should be below 120/80. The first ("top") number is called the systolic pressure. It is a measure of the pressure in your arteries as your heart beats. The second ("bottom") number is called the diastolic pressure. It is a measure of the pressure in your arteries as the heart relaxes. Blood pressure is classified into four stages. The following are the stages for adults who do not have a short-term serious illness or a chronic condition. Systolic pressure and diastolic pressure are measured in a unit called mm Hg. Normal  Systolic  pressure: below 120.  Diastolic pressure: below 80. Elevated  Systolic pressure: 120-129.  Diastolic pressure: below 80. Hypertension stage 1  Systolic pressure: 130-139.  Diastolic pressure: 80-89. Hypertension stage 2  Systolic pressure: 140 or above.  Diastolic pressure: 90 or above. You can have prehypertension or hypertension even if only the systolic or only the diastolic number in your reading is higher than normal. Follow these instructions at home:  Check your blood pressure as often as recommended by your health care provider.  Take your monitor to the next appointment with your health care provider to make sure: ? That you are using it correctly. ? That it provides accurate readings.  Be sure you understand what your goal blood pressure numbers are.  Tell your health care provider if you are having any side effects from blood pressure medicine. Contact a health care provider if:  Your blood pressure is consistently high. Get help right away if:  Your systolic blood pressure is higher than 180.  Your diastolic blood pressure is higher than 110. This information is not intended to replace advice given to you by your health care provider. Make sure you discuss any questions you have with your health care provider. Document Revised: 12/23/2016 Document Reviewed: 06/19/2015 Elsevier Patient Education  2020 Elsevier Inc.  Document Revised: 03/10/2017 Document Reviewed: 01/10/2017 Elsevier Patient Education  2020 [employer].

## 2020-01-09 ENCOUNTER — Telehealth: Payer: Self-pay | Admitting: *Deleted

## 2020-01-09 DIAGNOSIS — G43009 Migraine without aura, not intractable, without status migrainosus: Secondary | ICD-10-CM | POA: Insufficient documentation

## 2020-01-09 DIAGNOSIS — R030 Elevated blood-pressure reading, without diagnosis of hypertension: Secondary | ICD-10-CM | POA: Insufficient documentation

## 2020-01-09 LAB — CBC WITH DIFFERENTIAL/PLATELET
Basophils Absolute: 0.1 10*3/uL (ref 0.0–0.2)
Basos: 1 %
EOS (ABSOLUTE): 0.2 10*3/uL (ref 0.0–0.4)
Eos: 4 %
Hematocrit: 38.9 % (ref 34.0–46.6)
Hemoglobin: 12.7 g/dL (ref 11.1–15.9)
Immature Grans (Abs): 0 10*3/uL (ref 0.0–0.1)
Immature Granulocytes: 0 %
Lymphocytes Absolute: 2 10*3/uL (ref 0.7–3.1)
Lymphs: 44 %
MCH: 26.6 pg (ref 26.6–33.0)
MCHC: 32.6 g/dL (ref 31.5–35.7)
MCV: 82 fL (ref 79–97)
Monocytes Absolute: 0.3 10*3/uL (ref 0.1–0.9)
Monocytes: 6 %
Neutrophils Absolute: 2 10*3/uL (ref 1.4–7.0)
Neutrophils: 45 %
Platelets: 250 10*3/uL (ref 150–450)
RBC: 4.77 x10E6/uL (ref 3.77–5.28)
RDW: 13.6 % (ref 11.7–15.4)
WBC: 4.4 10*3/uL (ref 3.4–10.8)

## 2020-01-09 LAB — COMP. METABOLIC PANEL (12)
AST: 12 IU/L (ref 0–40)
Albumin/Globulin Ratio: 1.1 — ABNORMAL LOW (ref 1.2–2.2)
Albumin: 3.8 g/dL (ref 3.8–4.8)
Alkaline Phosphatase: 100 IU/L (ref 44–121)
BUN/Creatinine Ratio: 13 (ref 9–23)
BUN: 10 mg/dL (ref 6–24)
Bilirubin Total: 0.3 mg/dL (ref 0.0–1.2)
Calcium: 9.2 mg/dL (ref 8.7–10.2)
Chloride: 99 mmol/L (ref 96–106)
Creatinine, Ser: 0.79 mg/dL (ref 0.57–1.00)
GFR calc Af Amer: 105 mL/min/{1.73_m2} (ref >59)
GFR calc non Af Amer: 91 mL/min/{1.73_m2} (ref >59)
Globulin, Total: 3.6 g/dL (ref 1.5–4.5)
Glucose: 281 mg/dL — ABNORMAL HIGH (ref 65–99)
Potassium: 3.9 mmol/L (ref 3.5–5.2)
Sodium: 135 mmol/L (ref 134–144)
Total Protein: 7.4 g/dL (ref 6.0–8.5)

## 2020-01-09 LAB — TSH: TSH: 1.2 u[IU]/mL (ref 0.450–4.500)

## 2020-01-09 NOTE — Telephone Encounter (Signed)
-----   Message from Roney Jaffe, [place] sent at 01/09/2020 10:22 AM EST ----- Please call patient and let her know that her thyroid level was within normal limits, she does not show signs of anemia.  Her blood glucose levels were elevated assistant with her diabetes diagnosis.  Her kidney and liver function were within normal limits

## 2020-01-09 NOTE — Telephone Encounter (Signed)
MA unable to reach patient due to phone being disconnected. Patient does have mychart and is able to view results and providers note there.

## 2020-01-15 ENCOUNTER — Ambulatory Visit (INDEPENDENT_AMBULATORY_CARE_PROVIDER_SITE_OTHER): Payer: Medicaid Other | Admitting: Primary Care

## 2020-05-20 ENCOUNTER — Ambulatory Visit
Admission: EM | Admit: 2020-05-20 | Discharge: 2020-05-20 | Disposition: A | Discharge status: Home / Self Care | Payer: Medicaid Other | Attending: Internal Medicine | Admitting: Internal Medicine

## 2020-05-20 ENCOUNTER — Other Ambulatory Visit: Payer: Self-pay

## 2020-05-20 ENCOUNTER — Encounter: Payer: Self-pay | Admitting: Emergency Medicine

## 2020-05-20 DIAGNOSIS — N760 Acute vaginitis: Secondary | ICD-10-CM | POA: Insufficient documentation

## 2020-05-20 LAB — POCT URINALYSIS DIP (MANUAL ENTRY)
Bilirubin, UA: NEGATIVE
Glucose, UA: 500 mg/dL — AB
Ketones, POC UA: NEGATIVE mg/dL
Nitrite, UA: NEGATIVE
Protein Ur, POC: >=300 mg/dL — AB
Spec Grav, UA: >=1.03 — AB (ref 1.010–1.025)
Urobilinogen, UA: 0.2 E.U./dL
pH, UA: 5.5 (ref 5.0–8.0)

## 2020-05-20 MED ORDER — METRONIDAZOLE 500 MG PO TABS: 500.0000 mg | ORAL_TABLET | Freq: Two times a day (BID) | ORAL | 0 refills | Status: DC

## 2020-05-20 NOTE — ED Provider Notes (Signed)
[facility]    CSN: [id number] Arrival date & time: 05/20/20  1243      History   Chief Complaint Chief Complaint  Patient presents with  . Abdominal Pain  . Vaginal Discharge    HPI April Byrd is a 45 y.o. female comes to the urgent care with mild vaginal itching and vaginal discharge over the past few days.  Patient is diabetic and has a history of recurrent bacterial vaginosis.  Patient denies any antibiotic use recently.  No nausea or vomiting.  No dysuria, urgency or frequency.  She denies any superficial or deep dyspareunia.  Vaginal discharge is malodorous and clear with a thin consistency.  No changes in bowel movements.   Past Medical History:  Diagnosis Date  . Asthma    sports induced  . BMI 37.0-37.9,adult 04/26/2012  . Calculus of gallbladder with acute cholecystitis 04/26/2012  . Calculus of gallbladder with acute cholecystitis, without mention of obstruction 04/26/2012  . Type II or unspecified type diabetes mellitus without mention of complication, not stated as uncontrolled 04/26/2012  . Unspecified asthma(493.90) 04/26/2012  . Unspecified essential hypertension 04/26/2012    Patient Active Problem List   Diagnosis Date Noted  . Elevated blood pressure reading in office without diagnosis of hypertension 01/09/2020  . Migraine without aura and with status migrainosus, not intractable 01/09/2020  . Diabetes ([place]) 12/11/2018  . Vulvovaginal candidiasis 10/10/2018  . Abnormal uterine bleeding (AUB) 10/10/2018  . Primary osteoarthritis of both feet 02/12/2018  . Primary osteoarthritis of both knees 02/12/2018  . Primary osteoarthritis of both hands 02/12/2018  . Unspecified essential hypertension 04/26/2012  . Unspecified asthma(493.90) 04/26/2012  . BMI 37.0-37.9,adult 04/26/2012    Past Surgical History:  Procedure Laterality Date  . CESAREAN SECTION     x 3  . CHOLECYSTECTOMY N/A 04/25/2012   Procedure: LAPAROSCOPIC CHOLECYSTECTOMY WITH  INTRAOPERATIVE CHOLANGIOGRAM;  Surgeon: Edward Jolly, MD;  Location: [facility];  Service: General;  Laterality: N/A;  . hydrocephalus surgery    . TUBAL LIGATION      OB History    Gravida  6   Para  3   Term  3   Preterm      AB  3   Living  3     SAB  3   IAB      Ectopic      Multiple      Live Births           Obstetric Comments  c-section x 3.          Home Medications    Prior to Admission medications   Medication Sig Start Date End Date Taking? Authorizing Provider  metroNIDAZOLE (FLAGYL) 500 MG tablet Take 1 tablet (500 mg total) by mouth 2 (two) times daily. 05/20/20  Yes Janeece Blok, Myrene Galas, MD  Accu-Chek FastClix Lancets MISC USE AS DIRECTED ONCE DAILY TO  TEST  BLOOD  SUGAR DXE11.65 07/05/19   Fulp, Cammie, MD  acetaminophen (TYLENOL) 325 MG tablet Do not take more than 4000 mg of tylenol (acetaminophen) in any 24 hour period.  This is also in your prescribed pain medicine. 04/26/12   Earnstine Regal, PA-C  albuterol [place] Day HFA) 108 [phone] Base) MCG/ACT inhaler Inhale 2 puffs into the lungs every 6 (six) hours as needed for wheezing or shortness of breath. 07/05/19   Fulp, Cammie, MD  Blood Glucose Monitoring Suppl (ACCU-CHEK AVIVA PLUS) w/Device KIT 1 each by Does not apply route 3 (three) times daily.  Use to check blood sugars 3 times daily. IDC30.13 07/08/19   Fulp, Cammie, MD  glimepiride (AMARYL) 4 MG tablet Take 1 tablet (4 mg total) by mouth daily before breakfast. Must eat before taking medication 10/15/19   Ladell Pier, MD  glucose blood (ACCU-CHEK GUIDE) test strip Test 3 times daily DxE11.65 07/05/19   Fulp, Cammie, MD  insulin glargine (LANTUS SOLOSTAR) 100 UNIT/ML Solostar Pen 20 units subcut daily at bedtime. 10/15/19   Ladell Pier, MD  Insulin Pen Needle (TRUEPLUS PEN NEEDLES) 32G X 4 MM MISC Use to inject insulin once daily. 07/30/19   Fulp, Cammie, MD  metFORMIN (GLUCOPHAGE) 500 MG tablet Take 1 tablet (500 mg total) by mouth every  morning. 10/15/19   Ladell Pier, MD    Family History Family History  Problem Relation Age of Onset  . Diabetes Mother   . Hypertension Mother   . Kidney disease Father   . Healthy Brother   . Healthy Son   . Healthy Son   . Healthy Son     Social History Social History   Tobacco Use  . Smoking status: Never Smoker  . Smokeless tobacco: Never Used  Vaping Use  . Vaping Use: Never used  Substance Use Topics  . Alcohol use: Yes    Comment: social   . Drug use: No     Allergies   Penicillins   Review of Systems Review of Systems  Respiratory: Negative.   Gastrointestinal: Negative.   Genitourinary: Positive for vaginal discharge. Negative for dysuria, frequency, vaginal bleeding and vaginal pain.  Musculoskeletal: Negative.   Skin: Negative.      Physical Exam Triage Vital Signs ED Triage Vitals [05/20/20 1508]  Enc Vitals Group     BP (!) 130/92     Pulse Rate 74     Resp 20     Temp (!) 97.3 F (36.3 C)     Temp Source Oral     SpO2 98 %     Weight      Height      Head Circumference      Peak Flow      Pain Score 4     Pain Loc      Pain Edu?      Excl. in [place]?    No data found.  Updated Vital Signs BP (!) 130/92 (BP Location: Left Arm)   Pulse 74   Temp (!) 97.3 F (36.3 C) (Oral)   Resp 20   SpO2 98%   Visual Acuity Right Eye Distance:   Left Eye Distance:   Bilateral Distance:    Right Eye Near:   Left Eye Near:    Bilateral Near:     Physical Exam Vitals and nursing note reviewed.  Constitutional:      General: She is not in acute distress.    Appearance: She is not ill-appearing.  Cardiovascular:     Rate and Rhythm: Normal rate and regular rhythm.  Pulmonary:     Effort: Pulmonary effort is normal.     Breath sounds: Normal breath sounds.  Abdominal:     Palpations: Abdomen is soft.     Tenderness: There is no abdominal tenderness.  Neurological:     Mental Status: She is alert.      UC Treatments /  Results  Labs (all labs ordered are listed, but only abnormal results are displayed) Labs Reviewed  POCT URINALYSIS DIP (MANUAL ENTRY) - Abnormal; Notable for the  following components:      Result Value   Clarity, UA cloudy (*)    Glucose, UA =500 (*)    Spec Grav, UA >=1.030 (*)    Blood, UA moderate (*)    Protein Ur, POC >=300 (*)    Leukocytes, UA Trace (*)    All other components within normal limits  URINE CULTURE  CERVICOVAGINAL ANCILLARY ONLY    EKG   Radiology No results found.  Procedures Procedures (including critical care time)  Medications Ordered in UC Medications - No data to display  Initial Impression / Assessment and Plan / UC Course  I have reviewed the triage vital signs and the nursing notes.  Pertinent labs & imaging results that were available during my care of the patient were reviewed by me and considered in my medical decision making (see chart for details).     1.  Acute vaginitis: Metronidazole 500 mg twice daily for 7 days Cervical vaginal swab for bacterial vaginosis/vaginal yeast infection We will call patient with recommendations if labs are abnormal Return to urgent care if symptoms worsen  Final Clinical Impressions(s) / UC Diagnoses   Final diagnoses:  Acute vaginitis     Discharge Instructions     Take medications as prescribed We will call you with recommendations if labs are abnormal. Return to urgent care if symptoms worsen.   ED Prescriptions    Medication Sig Dispense Auth. Provider   metroNIDAZOLE (FLAGYL) 500 MG tablet Take 1 tablet (500 mg total) by mouth 2 (two) times daily. 14 tablet Rishard Delange, Myrene Galas, MD     PDMP not reviewed this encounter.   Chase Picket, MD 05/21/20 [phone]

## 2020-05-20 NOTE — Discharge Instructions (Signed)
Take medications as prescribed We will call you with recommendations if labs are abnormal Return to urgent care if symptoms worsen. 

## 2020-05-20 NOTE — ED Triage Notes (Signed)
Pt sts lower abd pressure and some discharge; pt sts similar to when had BV in past; pt sts is diabetic taking meds

## 2020-05-21 LAB — CERVICOVAGINAL ANCILLARY ONLY

## 2020-05-23 LAB — URINE CULTURE: Culture: 80000 — AB

## 2020-05-25 ENCOUNTER — Telehealth (HOSPITAL_COMMUNITY): Payer: Self-pay | Admitting: Emergency Medicine

## 2020-05-25 MED ORDER — NITROFURANTOIN MONOHYD MACRO 100 MG PO CAPS: 100.0000 mg | ORAL_CAPSULE | Freq: Two times a day (BID) | ORAL | 0 refills | Status: DC

## 2020-05-28 ENCOUNTER — Encounter: Payer: Medicaid Other | Admitting: Internal Medicine

## 2020-06-25 DIAGNOSIS — H5213 Myopia, bilateral: Secondary | ICD-10-CM | POA: Diagnosis not present

## 2020-06-29 ENCOUNTER — Other Ambulatory Visit: Payer: Self-pay

## 2020-06-29 ENCOUNTER — Encounter: Payer: Self-pay | Admitting: Internal Medicine

## 2020-06-29 ENCOUNTER — Ambulatory Visit (INDEPENDENT_AMBULATORY_CARE_PROVIDER_SITE_OTHER): Payer: Medicaid Other | Admitting: Internal Medicine

## 2020-06-29 VITALS — BP 124/87 | HR 90 | Resp 16 | Wt 216.0 lb

## 2020-06-29 DIAGNOSIS — R809 Proteinuria, unspecified: Secondary | ICD-10-CM

## 2020-06-29 DIAGNOSIS — E1142 Type 2 diabetes mellitus with diabetic polyneuropathy: Secondary | ICD-10-CM

## 2020-06-29 DIAGNOSIS — E119 Type 2 diabetes mellitus without complications: Secondary | ICD-10-CM

## 2020-06-29 LAB — POCT GLYCOSYLATED HEMOGLOBIN (HGB A1C): Hemoglobin A1C: 9.8 % — AB (ref 4.0–5.6)

## 2020-06-29 LAB — GLUCOSE, POCT (MANUAL RESULT ENTRY): POC Glucose: 226 mg/dl — AB (ref 70–99)

## 2020-06-29 MED ORDER — FREESTYLE LIBRE 2 SENSOR MISC
1.0000 | Freq: Every day | 0 refills | Status: DC
Start: 2020-06-29 — End: 2020-07-23

## 2020-06-29 MED ORDER — GABAPENTIN 300 MG PO CAPS: 300.0000 mg | ORAL_CAPSULE | Freq: Every day | ORAL | 1 refills | Status: DC

## 2020-06-29 MED ORDER — FREESTYLE LIBRE READER DEVI: 1.0000 | Freq: Every day | 0 refills | Status: DC

## 2020-06-29 MED ORDER — LOSARTAN POTASSIUM 50 MG PO TABS: 50.0000 mg | ORAL_TABLET | Freq: Every day | ORAL | 1 refills | Status: DC

## 2020-06-29 MED ORDER — LANTUS SOLOSTAR 100 UNIT/ML ~~LOC~~ SOPN
PEN_INJECTOR | SUBCUTANEOUS | 2 refills | Status: DC
Start: 2020-06-29 — End: 2020-11-13

## 2020-06-29 MED ORDER — METFORMIN HCL 500 MG PO TABS: 1000.0000 mg | ORAL_TABLET | Freq: Two times a day (BID) | ORAL | 3 refills | Status: DC

## 2020-06-29 NOTE — Progress Notes (Signed)
Subjective:    April Byrd - 45 y.o. female MRN [id number]  Date of birth: 26-Jan-1975  HPI  April Byrd is here for follow up of chronic medical conditions.  Diabetes mellitus, Type 2 Disease Monitoring             Blood Sugar Ranges: Does not do a great job of checking fasting CBGs because of work schedule. But reports that when she does check, typically in upper 200s to low 300s.              Polyuria: no             Visual problems: no   Urine Microalbumin 255 (Sept 2021)   Last A1C: 10.4 (Sept 2021)   Medications: Amaryl 4 mg, Metformin 500 mg BID, Lantus 22u   Medication Compliance: yes  Medication Side Effects             Hypoglycemia: no     Health Maintenance:  Health Maintenance Due  Topic Date Due  . COVID-19 Vaccine (1) Never done  . Pneumococcal Vaccine 106-35 Years old (1 of 2 - PPSV23) Never done  . FOOT EXAM  Never done  . OPHTHALMOLOGY EXAM  Never done  . Hepatitis C Screening  Never done  . TETANUS/TDAP  Never done  . COLONOSCOPY (Pts 45-17yrs Insurance coverage will need to be confirmed)  Never done    -  reports that she has never smoked. She has never used smokeless tobacco. - Review of Systems: Per HPI. - Past Medical History: Patient Active Problem List   Diagnosis Date Noted  . Elevated blood pressure reading in office without diagnosis of hypertension 01/09/2020  . Migraine without aura and with status migrainosus, not intractable 01/09/2020  . Diabetes ([facility]) 12/11/2018  . Vulvovaginal candidiasis 10/10/2018  . Abnormal uterine bleeding (AUB) 10/10/2018  . Primary osteoarthritis of both feet 02/12/2018  . Primary osteoarthritis of both knees 02/12/2018  . Primary osteoarthritis of both hands 02/12/2018  . Unspecified essential hypertension 04/26/2012  . Unspecified asthma(493.90) 04/26/2012  . BMI 37.0-37.9,adult 04/26/2012   - Medications: reviewed and updated   Objective:   Physical Exam BP 124/87   Pulse 90   Resp 16   Wt 216  lb (98 kg)   LMP 06/28/2020   SpO2 96%   BMI 37.08 kg/m  Physical Exam Constitutional:      General: She is not in acute distress.    Appearance: She is not diaphoretic.  Cardiovascular:     Rate and Rhythm: Normal rate.  Pulmonary:     Effort: Pulmonary effort is normal. No respiratory distress.  Musculoskeletal:        General: Normal range of motion.     Comments: Diabetic Foot Check -  Appearance - no lesions, ulcers or calluses Skin - no unusual pallor or redness Monofilament testing - normal bilaterally  Right - Great toe, medial, central, lateral ball and posterior foot intact Left - Great toe, medial, central, lateral ball and posterior foot intact   Skin:    General: Skin is warm and dry.  Neurological:     Mental Status: She is alert and oriented to person, place, and time.  Psychiatric:        Mood and Affect: Affect normal.        Judgment: Judgment normal.            Assessment & Plan:   1. Type 2 diabetes mellitus without complication, without long-term current use of  insulin (HCC) A1c minimally improved and still significantly above goal with result of 9.8%. Increase Metformin to 1000 mg BID. Increase Lantus from 22u to 26u. She would like a continuous meter due to her limitations with checking with her work schedule. This was ordered as well. Instructed to return in 4-6 weeks for more close monitoring of her DM and further adjustment of medications as needed.  - Glucose (CBG) - HgB A1c - metFORMIN (GLUCOPHAGE) 500 MG tablet; Take 2 tablets (1,000 mg total) by mouth 2 (two) times daily with a meal.  Dispense: 30 tablet; Refill: 3 - insulin glargine (LANTUS SOLOSTAR) 100 UNIT/ML Solostar Pen; 26 units subcut daily at bedtime.  Dispense: 15 mL; Refill: 2 - losartan (COZAAR) 50 MG tablet; Take 1 tablet (50 mg total) by mouth daily.  Dispense: 90 tablet; Refill: 1 - Continuous Blood Gluc Receiver (FREESTYLE LIBRE READER) DEVI; 1 Device by Does not apply route  daily.  Dispense: 1 each; Refill: 0 - Continuous Blood Gluc Sensor (FREESTYLE LIBRE 2 SENSOR) MISC; 1 Device by Does not apply route daily.  Dispense: 1 each; Refill: 0 - [facility] DIABETES FOOT EXAM  2. 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 Start Losartan for renal protection.  - losartan (COZAAR) 50 MG tablet; Take 1 tablet (50 mg total) by mouth daily.  Dispense: 90 tablet; Refill: 1  3. Diabetic peripheral neuropathy [facility]) Patient has symptoms concerning for mild DM neuropathy. Start Gabapentin. Renal function was normal on past BMET. - gabapentin (NEURONTIN) 300 MG capsule; Take 1 capsule (300 mg total) by mouth at bedtime.  Dispense: 90 capsule; Refill: 1      Marcy Siren, D.O. 06/29/2020, 10:02 AM Primary Care at [facility]

## 2020-06-29 NOTE — Progress Notes (Signed)
F/u DM  tingling in feet

## 2020-07-23 ENCOUNTER — Other Ambulatory Visit: Payer: Self-pay

## 2020-07-23 DIAGNOSIS — E119 Type 2 diabetes mellitus without complications: Secondary | ICD-10-CM

## 2020-07-23 MED ORDER — FREESTYLE LIBRE 2 SENSOR MISC: 1.0000 | Freq: Every day | 1 refills | Status: DC

## 2020-07-23 NOTE — Progress Notes (Signed)
Freestyle libre refilled

## 2020-08-07 ENCOUNTER — Telehealth: Payer: Self-pay | Admitting: Internal Medicine

## 2020-08-07 ENCOUNTER — Other Ambulatory Visit: Payer: Self-pay

## 2020-08-07 DIAGNOSIS — E119 Type 2 diabetes mellitus without complications: Secondary | ICD-10-CM

## 2020-08-07 MED ORDER — FREESTYLE LIBRE 2 SENSOR MISC: 1.0000 | Freq: Every day | 1 refills | Status: DC

## 2020-08-07 NOTE — Telephone Encounter (Signed)
Patient calling in for a refill Continuous Blood Gluc Sensor (FREESTYLE LIBRE 2 SENSOR) MISC    Pharmacy  [facility] [address] [place], [place] - [address]  [address] [place], [place] [address]  Phone:  [phone]  Fax:  [phone]

## 2020-08-07 NOTE — Progress Notes (Signed)
Patient did not show for appointment.   

## 2020-08-10 ENCOUNTER — Other Ambulatory Visit: Payer: Self-pay

## 2020-08-10 ENCOUNTER — Encounter: Payer: Medicaid Other | Admitting: Family

## 2020-08-10 NOTE — Progress Notes (Signed)
Patient did not show for appointment.   

## 2020-08-12 ENCOUNTER — Ambulatory Visit: Payer: Medicaid Other | Admitting: Family

## 2020-08-12 ENCOUNTER — Other Ambulatory Visit: Payer: Self-pay

## 2020-08-12 ENCOUNTER — Encounter: Payer: Self-pay | Admitting: Family

## 2020-08-12 ENCOUNTER — Ambulatory Visit (INDEPENDENT_AMBULATORY_CARE_PROVIDER_SITE_OTHER): Payer: Medicaid Other | Admitting: Family

## 2020-08-12 VITALS — BP 136/89 | HR 87 | Temp 97.9°F | Resp 16 | Ht 64.02 in | Wt 224.0 lb

## 2020-08-12 DIAGNOSIS — Z6838 Body mass index (BMI) 38.0-38.9, adult: Secondary | ICD-10-CM

## 2020-08-12 DIAGNOSIS — Z794 Long term (current) use of insulin: Secondary | ICD-10-CM

## 2020-08-12 DIAGNOSIS — E1141 Type 2 diabetes mellitus with diabetic mononeuropathy: Secondary | ICD-10-CM

## 2020-08-12 DIAGNOSIS — Z7689 Persons encountering health services in other specified circumstances: Secondary | ICD-10-CM | POA: Diagnosis not present

## 2020-08-12 MED ORDER — PHENTERMINE HCL 15 MG PO CAPS: 15.0000 mg | ORAL_CAPSULE | ORAL | 0 refills | Status: DC

## 2020-08-12 NOTE — Progress Notes (Signed)
Pt presents for diabetes follow-up, would like to discuss weight loss options

## 2020-08-12 NOTE — Progress Notes (Signed)
Patient ID: April Byrd, female    DOB: 03/19/1975  MRN: [id number]  CC: Diabetes Follow-Up  Subjective: April Byrd is a 45 y.o. female who presents for diabetes follow-up.   Her concerns today include:   DIABETES TYPE 2 FOLLOW-UP: 06/29/2020 per DO note: A1c minimally improved and still significantly above goal with result of 9.8%. Increase Metformin to 1000 mg BID. Increase Lantus from 22u to 26u. She would like a continuous meter due to her limitations with checking with her work schedule. This was ordered as well. Instructed to return in 4-6 weeks for more close monitoring of her DM and further adjustment of medications as needed.   08/12/2020: Med Adherence:  '[x]'  Yes    '[]'  No Medication side effects:  '[x]'  Yes, low blood sugars < 70 on Lantus 26 units, decreased to Lantus 22 units and improved  Home Monitoring?  '[x]'  Yes    '[]'  No Home glucose results range: 152 over the last 7 days and 146 over the last 14 days Diet Adherence: '[x]'  Yes    '[]'  No Exercise: walking Comments: Taking Metformin 500 mg four times daily instead of 1000 mg twice daily. Prefers regimen this way. Still taking Glimepiride and Losartan.  2. WEIGHT MANAGEMENT: Would like to try medication. Highest weight 220 pounds. Goal weight 160 to 170 pounds. Tried diet teas in the past. She is watching what she eats and exercising.    Patient Active Problem List   Diagnosis Date Noted   Microalbuminuria 06/29/2020   Elevated blood pressure reading in office without diagnosis of hypertension 01/09/2020   Migraine without aura and with status migrainosus, not intractable 01/09/2020   Diabetes ([place]) 12/11/2018   Vulvovaginal candidiasis 10/10/2018   Abnormal uterine bleeding (AUB) 10/10/2018   Primary osteoarthritis of both feet 02/12/2018   Primary osteoarthritis of both knees 02/12/2018   Primary osteoarthritis of both hands 02/12/2018   Unspecified essential hypertension 04/26/2012   Unspecified asthma(493.90)  04/26/2012   BMI 37.0-37.9,adult 04/26/2012     Current Outpatient Medications on File Prior to Visit  Medication Sig Dispense Refill   Accu-Chek FastClix Lancets MISC USE AS DIRECTED ONCE DAILY TO  TEST  BLOOD  SUGAR DXE11.65 102 each 0   Blood Glucose Monitoring Suppl (ACCU-CHEK AVIVA PLUS) w/Device KIT 1 each by Does not apply route 3 (three) times daily. Use to check blood sugars 3 times daily. DXE11.65 1 kit 0   Continuous Blood Gluc Receiver (FREESTYLE LIBRE READER) DEVI 1 Device by Does not apply route daily. 1 each 0   Continuous Blood Gluc Sensor (FREESTYLE LIBRE 2 SENSOR) MISC 1 Device by Does not apply route daily. 1 each 1   gabapentin (NEURONTIN) 300 MG capsule Take 1 capsule (300 mg total) by mouth at bedtime. 90 capsule 1   glimepiride (AMARYL) 4 MG tablet Take 1 tablet (4 mg total) by mouth daily before breakfast. Must eat before taking medication 30 tablet 3   glucose blood (ACCU-CHEK GUIDE) test strip Test 3 times daily DxE11.65 100 each 11   insulin glargine (LANTUS SOLOSTAR) 100 UNIT/ML Solostar Pen 26 units subcut daily at bedtime. 15 mL 2   losartan (COZAAR) 50 MG tablet Take 1 tablet (50 mg total) by mouth daily. 90 tablet 1   metFORMIN (GLUCOPHAGE) 500 MG tablet Take 2 tablets (1,000 mg total) by mouth 2 (two) times daily with a meal. 30 tablet 3   No current facility-administered medications on file prior to visit.    Allergies  Allergen Reactions   Penicillins Other (See Comments)    Has patient had a PCN reaction causing immediate rash, facial/tongue/throat swelling, SOB or lightheadedness with hypotension: Unknown Has patient had a PCN reaction causing severe rash involving mucus membranes or skin necrosis: Unknown Has patient had a PCN reaction that required hospitalization: Unknown Has patient had a PCN reaction occurring within the last 10 years: No If all of the above answers are "NO", then may proceed with Cephalosporin use.     Social History    Socioeconomic History   Marital status: Single    Spouse name: Not on file   Number of children: Not on file   Years of education: Not on file   Highest education level: Not on file  Occupational History   Not on file  Tobacco Use   Smoking status: Never   Smokeless tobacco: Never  Vaping Use   Vaping Use: Never used  Substance and Sexual Activity   Alcohol use: Yes    Comment: social    Drug use: No   Sexual activity: Yes    Birth control/protection: Condom, Surgical  Other Topics Concern   Not on file  Social History Narrative   Not on file   Social Determinants of Health   Financial Resource Strain: Not on file  Food Insecurity: Not on file  Transportation Needs: Not on file  Physical Activity: Not on file  Stress: Not on file  Social Connections: Not on file  Intimate Partner Violence: Not on file    Family History  Problem Relation Age of Onset   Diabetes Mother    Hypertension Mother    Kidney disease Father    Healthy Brother    Healthy Son    Healthy Son    Healthy Son     Past Surgical History:  Procedure Laterality Date   CESAREAN SECTION     x 3   CHOLECYSTECTOMY N/A 04/25/2012   Procedure: LAPAROSCOPIC CHOLECYSTECTOMY WITH INTRAOPERATIVE CHOLANGIOGRAM;  Surgeon: Edward Jolly, MD;  Location: [facility];  Service: General;  Laterality: N/A;   hydrocephalus surgery     TUBAL LIGATION      ROS: Review of Systems Negative except as stated above  PHYSICAL EXAM: BP 136/89 (BP Location: Left Arm, Patient Position: Sitting, Cuff Size: Large)   Pulse 87   Temp 97.9 F (36.6 C)   Resp 16   Ht 5' 4.02" (1.626 m)   Wt 224 lb (101.6 kg)   SpO2 98%   BMI 38.43 kg/m   Physical Exam HENT:     Head: Normocephalic and atraumatic.  Eyes:     Extraocular Movements: Extraocular movements intact.     Conjunctiva/sclera: Conjunctivae normal.     Pupils: Pupils are equal, round, and reactive to light.  Cardiovascular:     Rate and Rhythm: Normal  rate and regular rhythm.     Pulses: Normal pulses.     Heart sounds: Normal heart sounds.  Pulmonary:     Effort: Pulmonary effort is normal.     Breath sounds: Normal breath sounds.  Musculoskeletal:     Cervical back: Normal range of motion and neck supple.  Neurological:     General: No focal deficit present.     Mental Status: She is alert and oriented to person, place, and time.  Psychiatric:        Mood and Affect: Mood normal.        Behavior: Behavior normal.   ASSESSMENT AND PLAN: 1.  Type 2 diabetes mellitus with diabetic mononeuropathy, with long-term current use of insulin ([place]): - Hemoglobin A1c 9.8% on 06/29/2020. Next hemoglobin A1c due September 2022.  - Home blood sugars are at goal on average.  - Continue Metformin, Glimepiride, and Insulin Glargine as prescribed.  - Discussed the importance of healthy eating habits, low-carbohydrate diet, low-sugar diet, regular aerobic exercise (at least 150 minutes a week as tolerated) and medication compliance to achieve or maintain control of diabetes. To achieve an A1C goal of less than or equal to 7.0 percent, a fasting blood sugar of 80 to 130 mg/dL and a postprandial glucose (90 to 120 minutes after a meal) less than 180 mg/dL. In the event of sugars less than 60 mg/dl or greater than 400 mg/dl please notify the clinic ASAP. It is recommended that you undergo annual eye exams and annual foot exams. - Follow-up with primary provider in 8 weeks or sooner if needed.   2. Encounter for weight management: 3. Class 2 severe obesity with serious comorbidity and body mass index (BMI) of 38.0 to 38.9 in adult, unspecified obesity type ([place]): -  Phentermine as prescribed. Low-dose of entire mean to try to curb appetite. Encouraged to keep an eye on blood pressure when able to do so. Counseled to let me know if she has any palpitations. Patient verbalized understanding. - Counseled patient that after 12 weeks of medication use we will need to  take a medication holiday for several months. Patient verbalized understanding. - I did check the [facility] prescription drug database and found no frequent prescribers of opiates or evidence of aberrant behavior. - Counseled on low-sodium DASH diet and 150 minutes of moderate intensity exercise per week as tolerated to assist with weight management.  - Follow-up with primary provider within 4 weeks or sooner if needed. - phentermine 15 MG capsule; Take 1 capsule (15 mg total) by mouth every morning.  Dispense: 30 capsule; Refill: 0   Patient was given the opportunity to ask questions.  Patient verbalized understanding of the plan and was able to repeat key elements of the plan. Patient was given clear instructions to go to Emergency Department or return to medical center if symptoms don't improve, worsen, or new problems develop.The patient verbalized understanding.   Requested Prescriptions   Signed Prescriptions Disp Refills   phentermine 15 MG capsule 30 capsule 0    Sig: Take 1 capsule (15 mg total) by mouth every morning.    Return for Follow-Up 3 months diabetes and 4 weeks weight management .  Camillia Herter, NP

## 2020-08-25 ENCOUNTER — Telehealth: Payer: Self-pay | Admitting: Internal Medicine

## 2020-08-25 NOTE — Telephone Encounter (Signed)
Patient stated she tried the medication for 1 day it worked fine in the day time, but she was unable to sleep and messed with her blood pressure. Patient stated it put her down for 2 days and she has not touched it since. Patient is asking for the medication to be d/c'd and wanting to try something else.

## 2020-09-15 NOTE — Progress Notes (Signed)
Virtual Visit via Telephone Note  I connected with April Byrd, on 09/16/2020 at 2:01 PM by telephone due to the COVID-19 pandemic and verified that I am speaking with the correct person using two identifiers.  Due to current restrictions/limitations of in-office visits due to the COVID-19 pandemic, this scheduled clinical appointment was converted to a telehealth visit.   Consent: I discussed the limitations, risks, security and privacy concerns of performing an evaluation and management service by telephone and the availability of in person appointments. I also discussed with the patient that there may be a patient responsible charge related to this service. The patient expressed understanding and agreed to proceed.   Location of Patient: Home  Location of Provider: [facility] [place] participating in Telemedicine visit: Daiva Huge, NP Elmon Else, CMA   History of Present Illness: April Byrd is a 45 year-old female who presents for weight management follow-up.   WEIGHT MANAGEMENT FOLLOW-UP: 08/12/2020: - Begin Phentermine.  08/25/2020: Patient reports Phentermine caused her to be unable to sleep and affected her blood pressure.   09/16/2020: Not monitoring weight at home. She is watching what she eats and walking for exercise. Home blood sugars averaging 143 over the last week. Not ready to discontinue Lantus for replacement of weight management injectable medication. Prefers to remain on Lantus as of present and recheck hemoglobin A1c before making adjustments.    Past Medical History:  Diagnosis Date   Asthma    sports induced   BMI 37.0-37.9,adult 04/26/2012   Calculus of gallbladder with acute cholecystitis 04/26/2012   Calculus of gallbladder with acute cholecystitis, without mention of obstruction 04/26/2012   Type II or unspecified type diabetes mellitus without mention of complication, not stated as uncontrolled  04/26/2012   Unspecified asthma(493.90) 04/26/2012   Unspecified essential hypertension 04/26/2012   Allergies  Allergen Reactions   Penicillins Other (See Comments)    Has patient had a PCN reaction causing immediate rash, facial/tongue/throat swelling, SOB or lightheadedness with hypotension: Unknown Has patient had a PCN reaction causing severe rash involving mucus membranes or skin necrosis: Unknown Has patient had a PCN reaction that required hospitalization: Unknown Has patient had a PCN reaction occurring within the last 10 years: No If all of the above answers are "NO", then may proceed with Cephalosporin use.     Current Outpatient Medications on File Prior to Visit  Medication Sig Dispense Refill   Accu-Chek FastClix Lancets MISC USE AS DIRECTED ONCE DAILY TO  TEST  BLOOD  SUGAR DXE11.65 102 each 0   Blood Glucose Monitoring Suppl (ACCU-CHEK AVIVA PLUS) w/Device KIT 1 each by Does not apply route 3 (three) times daily. Use to check blood sugars 3 times daily. DXE11.65 1 kit 0   Continuous Blood Gluc Receiver (FREESTYLE LIBRE READER) DEVI 1 Device by Does not apply route daily. 1 each 0   Continuous Blood Gluc Sensor (FREESTYLE LIBRE 2 SENSOR) MISC 1 Device by Does not apply route daily. 1 each 1   gabapentin (NEURONTIN) 300 MG capsule Take 1 capsule (300 mg total) by mouth at bedtime. 90 capsule 1   glimepiride (AMARYL) 4 MG tablet Take 1 tablet (4 mg total) by mouth daily before breakfast. Must eat before taking medication 30 tablet 3   glucose blood (ACCU-CHEK GUIDE) test strip Test 3 times daily DxE11.65 100 each 11   insulin glargine (LANTUS SOLOSTAR) 100 UNIT/ML Solostar Pen 26 units subcut daily at bedtime. 15 mL 2  losartan (COZAAR) 50 MG tablet Take 1 tablet (50 mg total) by mouth daily. 90 tablet 1   metFORMIN (GLUCOPHAGE) 500 MG tablet Take 2 tablets (1,000 mg total) by mouth 2 (two) times daily with a meal. 30 tablet 3   phentermine 15 MG capsule Take 1 capsule (15 mg  total) by mouth every morning. 30 capsule 0   No current facility-administered medications on file prior to visit.    Observations/Objective: Alert and oriented x 3. Not in acute distress. Physical examination not completed as this is a telemedicine visit.  Assessment and Plan: 1. Encounter for weight management: 2. Class 2 severe obesity with serious comorbidity and body mass index (BMI) of 38.0 to 38.9 in adult, unspecified obesity type ([place]): - Phentermine discontinued related to medication side effect of keeping her awake during bedtime.  - Counseled patient on option to begin Semaglutide for weight management.  - Counseled patient would not recommend taking Semaglutide simultaneously with Lantus as this may increase risk of hypoglycemia. Would recommend discontinuing Lantus and replacing with Semaglutide. Patient verbalized understanding.  - Patient not ready to discontinue Lantus for replacement with Semaglutide. Prefers to remain on Lantus as of present and recheck hemoglobin A1c before making adjustments.  - Follow-up with primary provider in 4 weeks or sooner if needed.   Follow Up Instructions: Follow-up with primary provider in 4 weeks or sooner if needed.    Patient was given clear instructions to go to Emergency Department or return to medical center if symptoms don't improve, worsen, or new problems develop.The patient verbalized understanding.  I discussed the assessment and treatment plan with the patient. The patient was provided an opportunity to ask questions and all were answered. The patient agreed with the plan and demonstrated an understanding of the instructions.   The patient was advised to call back or seek an in-person evaluation if the symptoms worsen or if the condition fails to improve as anticipated.    I provided 15 minutes total of non-face-to-face time during this encounter.   Camillia Herter, NP  [facility] [place],  [place] 09/16/2020, 2:01 PM

## 2020-09-16 ENCOUNTER — Other Ambulatory Visit: Payer: Self-pay

## 2020-09-16 ENCOUNTER — Telehealth (INDEPENDENT_AMBULATORY_CARE_PROVIDER_SITE_OTHER): Payer: Medicaid Other | Admitting: Family

## 2020-09-16 ENCOUNTER — Telehealth: Payer: Medicaid Other | Admitting: Family

## 2020-09-16 DIAGNOSIS — Z6838 Body mass index (BMI) 38.0-38.9, adult: Secondary | ICD-10-CM

## 2020-09-16 DIAGNOSIS — Z7689 Persons encountering health services in other specified circumstances: Secondary | ICD-10-CM | POA: Diagnosis not present

## 2020-09-16 NOTE — Progress Notes (Signed)
Pt presents for telemedicine visit, pt reports stopped taking Phentermine due to was keeping her awake

## 2020-11-06 NOTE — Progress Notes (Signed)
Patient ID: April Byrd, female    DOB: May 12, 1975  MRN: [id number]  CC: Diabetes Follow-Up  Subjective: April Byrd is a 45 y.o. female who presents for diabetes follow-up.   Her concerns today include:   DIABETES TYPE 2 FOLLOW-UP: 08/12/2020: - Continue Metformin, Glimepiride, and Insulin Glargine as prescribed.   11/13/2020: Doing well on current regimen. Reports only able to tolerate 22 to 24 units of Insulin, if higher then blood sugars sometimes become too low. Walking for exercise daily. Reports blood pressure higher than normal today likely related to home concerns, otherwise feeling well. No longer interested in beginning Ozempic because side effects researched online.   Patient Active Problem List   Diagnosis Date Noted   Microalbuminuria 06/29/2020   Elevated blood pressure reading in office without diagnosis of hypertension 01/09/2020   Migraine without aura and with status migrainosus, not intractable 01/09/2020   Diabetes ([place]) 12/11/2018   Vulvovaginal candidiasis 10/10/2018   Abnormal uterine bleeding (AUB) 10/10/2018   Primary osteoarthritis of both feet 02/12/2018   Primary osteoarthritis of both knees 02/12/2018   Primary osteoarthritis of both hands 02/12/2018   Unspecified essential hypertension 04/26/2012   Unspecified asthma(493.90) 04/26/2012   BMI 37.0-37.9,adult 04/26/2012     Current Outpatient Medications on File Prior to Visit  Medication Sig Dispense Refill   Accu-Chek FastClix Lancets MISC USE AS DIRECTED ONCE DAILY TO  TEST  BLOOD  SUGAR DXE11.65 102 each 0   Blood Glucose Monitoring Suppl (ACCU-CHEK AVIVA PLUS) w/Device KIT 1 each by Does not apply route 3 (three) times daily. Use to check blood sugars 3 times daily. DXE11.65 1 kit 0   Continuous Blood Gluc Receiver (FREESTYLE LIBRE READER) DEVI 1 Device by Does not apply route daily. 1 each 0   Continuous Blood Gluc Sensor (FREESTYLE LIBRE 2 SENSOR) MISC 1 Device by Does not apply route  daily. 1 each 1   glucose blood (ACCU-CHEK GUIDE) test strip Test 3 times daily DxE11.65 100 each 11   losartan (COZAAR) 50 MG tablet Take 1 tablet (50 mg total) by mouth daily. 90 tablet 1   phentermine 15 MG capsule Take 1 capsule (15 mg total) by mouth every morning. 30 capsule 0   No current facility-administered medications on file prior to visit.    Allergies  Allergen Reactions   Penicillins Other (See Comments)    Has patient had a PCN reaction causing immediate rash, facial/tongue/throat swelling, SOB or lightheadedness with hypotension: Unknown Has patient had a PCN reaction causing severe rash involving mucus membranes or skin necrosis: Unknown Has patient had a PCN reaction that required hospitalization: Unknown Has patient had a PCN reaction occurring within the last 10 years: No If all of the above answers are "NO", then may proceed with Cephalosporin use.     Social History   Socioeconomic History   Marital status: Single    Spouse name: Not on file   Number of children: Not on file   Years of education: Not on file   Highest education level: Not on file  Occupational History   Not on file  Tobacco Use   Smoking status: Never   Smokeless tobacco: Never  Vaping Use   Vaping Use: Never used  Substance and Sexual Activity   Alcohol use: Yes    Comment: social    Drug use: No   Sexual activity: Yes    Birth control/protection: Condom, Surgical  Other Topics Concern   Not on file  Social History  Narrative   Not on file   Social Determinants of Health   Financial Resource Strain: Not on file  Food Insecurity: Not on file  Transportation Needs: Not on file  Physical Activity: Not on file  Stress: Not on file  Social Connections: Not on file  Intimate Partner Violence: Not on file    Family History  Problem Relation Age of Onset   Diabetes Mother    Hypertension Mother    Kidney disease Father    Healthy Brother    Healthy Son    Healthy Son     Healthy Son     Past Surgical History:  Procedure Laterality Date   CESAREAN SECTION     x 3   CHOLECYSTECTOMY N/A 04/25/2012   Procedure: LAPAROSCOPIC CHOLECYSTECTOMY WITH INTRAOPERATIVE CHOLANGIOGRAM;  Surgeon: Edward Jolly, MD;  Location: [facility];  Service: General;  Laterality: N/A;   hydrocephalus surgery     TUBAL LIGATION      ROS: Review of Systems Negative except as stated above  PHYSICAL EXAM: BP (!) 144/86   Pulse 75   Temp 97.9 F (36.6 C) (Oral)   Ht _0  (1.626 m)   Wt 221 lb 12.8 oz (100.6 kg)   SpO2 96%   BMI 38.07 kg/m   Wt Readings from Last 3 Encounters:  11/13/20 221 lb 12.8 oz (100.6 kg)  08/12/20 224 lb (101.6 kg)  06/29/20 216 lb (98 kg)    Physical Exam HENT:     Head: Normocephalic and atraumatic.  Eyes:     Extraocular Movements: Extraocular movements intact.     Conjunctiva/sclera: Conjunctivae normal.     Pupils: Pupils are equal, round, and reactive to light.  Cardiovascular:     Rate and Rhythm: Normal rate and regular rhythm.     Pulses: Normal pulses.     Heart sounds: Normal heart sounds.  Pulmonary:     Effort: Pulmonary effort is normal.     Breath sounds: Normal breath sounds.  Musculoskeletal:     Cervical back: Normal range of motion and neck supple.  Neurological:     General: No focal deficit present.     Mental Status: She is alert.  Psychiatric:        Mood and Affect: Mood normal.        Behavior: Behavior normal.   Results for orders placed or performed in visit on 11/13/20  POCT glycosylated hemoglobin (Hb A1C)  Result Value Ref Range   Hemoglobin A1C     HbA1c POC (<> result, manual entry)     HbA1c, POC (prediabetic range)     HbA1c, POC (controlled diabetic range) 8.6 (A) 0.0 - 7.0 %    ASSESSMENT AND PLAN: 1. Type 2 diabetes mellitus with diabetic mononeuropathy, with long-term current use of insulin ([place]): - Hemoglobin A1c today not at goal at 8.6%, goal < 7%. However, this is improved from  previous of 9.8% on 06/29/2020.  - Continue Metformin as prescribed.  - Will decrease Insulin Glargine from 26 units daily to 24 units daily as dosages above this causes hypoglycemia. - Increase Glimepiride from 4 mg daily to 8 mg daily.  - Discussed the importance of healthy eating habits, low-carbohydrate diet, low-sugar diet, regular aerobic exercise (at least 150 minutes a week as tolerated) and medication compliance to achieve or maintain control of diabetes. - Follow-up with primary provider in 4 weeks or sooner if needed. - POCT glycosylated hemoglobin (Hb B3Z) - Basic Metabolic Panel -  glimepiride (AMARYL) 4 MG tablet; Take 2 tablets (8 mg total) by mouth daily before breakfast. Must eat before taking medication  Dispense: 60 tablet; Refill: 0 - metFORMIN (GLUCOPHAGE) 500 MG tablet; Take 2 tablets (1,000 mg total) by mouth 2 (two) times daily with a meal.  Dispense: 360 tablet; Refill: 0 - insulin glargine (LANTUS SOLOSTAR) 100 UNIT/ML Solostar Pen; 24 units subcut daily at bedtime.  Dispense: 15 mL; Refill: 2 - Insulin Pen Needle (PEN NEEDLES) 31G X 8 MM MISC; UAD  Dispense: 100 each; Refill: 0  2. Diabetic peripheral neuropathy ([place]): - Continue Gabapentin as prescribed.  - Follow-up with primary provider as scheduled.  - gabapentin (NEURONTIN) 300 MG capsule; Take 1 capsule (300 mg total) by mouth at bedtime.  Dispense: 90 capsule; Refill: 0    Patient was given the opportunity to ask questions.  Patient verbalized understanding of the plan and was able to repeat key elements of the plan. Patient was given clear instructions to go to Emergency Department or return to medical center if symptoms don't improve, worsen, or new problems develop.The patient verbalized understanding.   Orders Placed This Encounter  Procedures   Basic Metabolic Panel   POCT glycosylated hemoglobin (Hb A1C)     Requested Prescriptions   Signed Prescriptions Disp Refills   glimepiride (AMARYL) 4 MG  tablet 60 tablet 0    Sig: Take 2 tablets (8 mg total) by mouth daily before breakfast. Must eat before taking medication   metFORMIN (GLUCOPHAGE) 500 MG tablet 360 tablet 0    Sig: Take 2 tablets (1,000 mg total) by mouth 2 (two) times daily with a meal.   insulin glargine (LANTUS SOLOSTAR) 100 UNIT/ML Solostar Pen 15 mL 2    Sig: 24 units subcut daily at bedtime.   gabapentin (NEURONTIN) 300 MG capsule 90 capsule 0    Sig: Take 1 capsule (300 mg total) by mouth at bedtime.   Insulin Pen Needle (PEN NEEDLES) 31G X 8 MM MISC 100 each 0    Sig: UAD    Return in about 4 weeks (around 12/11/2020) for Follow-Up or next available diabetes .  Camillia Herter, NP

## 2020-11-13 ENCOUNTER — Ambulatory Visit (INDEPENDENT_AMBULATORY_CARE_PROVIDER_SITE_OTHER): Payer: Medicaid Other | Admitting: Family

## 2020-11-13 ENCOUNTER — Other Ambulatory Visit: Payer: Self-pay

## 2020-11-13 VITALS — BP 144/86 | HR 75 | Temp 97.9°F | Ht 64.0 in | Wt 221.8 lb

## 2020-11-13 DIAGNOSIS — E1142 Type 2 diabetes mellitus with diabetic polyneuropathy: Secondary | ICD-10-CM | POA: Diagnosis not present

## 2020-11-13 DIAGNOSIS — E1141 Type 2 diabetes mellitus with diabetic mononeuropathy: Secondary | ICD-10-CM

## 2020-11-13 DIAGNOSIS — Z794 Long term (current) use of insulin: Secondary | ICD-10-CM | POA: Diagnosis not present

## 2020-11-13 LAB — POCT GLYCOSYLATED HEMOGLOBIN (HGB A1C): HbA1c, POC (controlled diabetic range): 8.6 % — AB (ref 0.0–7.0)

## 2020-11-13 MED ORDER — GABAPENTIN 300 MG PO CAPS: 300.0000 mg | ORAL_CAPSULE | Freq: Every day | ORAL | 0 refills | Status: DC

## 2020-11-13 MED ORDER — GLIMEPIRIDE 4 MG PO TABS: 8.0000 mg | ORAL_TABLET | Freq: Every day | ORAL | 0 refills | Status: DC

## 2020-11-13 MED ORDER — PEN NEEDLES 31G X 8 MM MISC: 0 refills | Status: DC

## 2020-11-13 MED ORDER — METFORMIN HCL 500 MG PO TABS
1000.0000 mg | ORAL_TABLET | Freq: Two times a day (BID) | ORAL | 0 refills | Status: DC
Start: 2020-11-13 — End: 2021-02-16

## 2020-11-13 MED ORDER — LANTUS SOLOSTAR 100 UNIT/ML ~~LOC~~ SOPN: PEN_INJECTOR | SUBCUTANEOUS | 2 refills | Status: DC

## 2020-12-01 ENCOUNTER — Other Ambulatory Visit: Payer: Self-pay | Admitting: Family

## 2020-12-01 DIAGNOSIS — E119 Type 2 diabetes mellitus without complications: Secondary | ICD-10-CM

## 2020-12-06 NOTE — Progress Notes (Signed)
Patient ID: April Byrd, female    DOB: [birth year]  MRN: [id number]  CC: Diabetes Follow-Up   Subjective: April Byrd is a 45 y.o. female who presents for diabetes follow-up.   Her concerns today include:   DIABETES FOLLOW-UP: 11/13/2020: - Hemoglobin A1c today not at goal at 8.6%, goal < 7%. However, this is improved from previous of 9.8% on 06/29/2020.  - Continue Metformin as prescribed.  - Will decrease Insulin Glargine from 26 units daily to 24 units daily as dosages above this causes hypoglycemia. - Increase Glimepiride from 4 mg daily to 8 mg daily.   12/10/2020: Reports picked up new prescription for Glimepiride a couple days ago (related to cost) and plans to start new regimen beginning today. Doing well on Metformin and insulin. Needs refills on Losartan and glucose sensors. Reports after Thanksgiving planning to begin 12 hour intermittent fasting and juicing. Trying to get A1c under 7% for life insurance approval.    Patient Active Problem List   Diagnosis Date Noted   Microalbuminuria 06/29/2020   Elevated blood pressure reading in office without diagnosis of hypertension 01/09/2020   Migraine without aura and with status migrainosus, not intractable 01/09/2020   Diabetes ([place]) 12/11/2018   Vulvovaginal candidiasis 10/10/2018   Abnormal uterine bleeding (AUB) 10/10/2018   Primary osteoarthritis of both feet 02/12/2018   Primary osteoarthritis of both knees 02/12/2018   Primary osteoarthritis of both hands 02/12/2018   Unspecified asthma(493.90) 04/26/2012   BMI 37.0-37.9,adult 04/26/2012     Current Outpatient Medications on File Prior to Visit  Medication Sig Dispense Refill   Accu-Chek FastClix Lancets MISC USE AS DIRECTED ONCE DAILY TO  TEST  BLOOD  SUGAR DXE11.65 102 each 0   Blood Glucose Monitoring Suppl (ACCU-CHEK AVIVA PLUS) w/Device KIT 1 each by Does not apply route 3 (three) times daily. Use to check blood sugars 3 times daily. DXE11.65 1 kit 0    Continuous Blood Gluc Receiver (FREESTYLE LIBRE READER) DEVI 1 Device by Does not apply route daily. 1 each 0   gabapentin (NEURONTIN) 300 MG capsule Take 1 capsule (300 mg total) by mouth at bedtime. 90 capsule 0   glimepiride (AMARYL) 4 MG tablet Take 2 tablets (8 mg total) by mouth daily before breakfast. Must eat before taking medication 60 tablet 0   insulin glargine (LANTUS SOLOSTAR) 100 UNIT/ML Solostar Pen 24 units subcut daily at bedtime. 15 mL 2   Insulin Pen Needle (PEN NEEDLES) 31G X 8 MM MISC UAD 100 each 0   metFORMIN (GLUCOPHAGE) 500 MG tablet Take 2 tablets (1,000 mg total) by mouth 2 (two) times daily with a meal. 360 tablet 0   phentermine 15 MG capsule Take 1 capsule (15 mg total) by mouth every morning. 30 capsule 0   No current facility-administered medications on file prior to visit.    Allergies  Allergen Reactions   Penicillins Other (See Comments)    Has patient had a PCN reaction causing immediate rash, facial/tongue/throat swelling, SOB or lightheadedness with hypotension: Unknown Has patient had a PCN reaction causing severe rash involving mucus membranes or skin necrosis: Unknown Has patient had a PCN reaction that required hospitalization: Unknown Has patient had a PCN reaction occurring within the last 10 years: No If all of the above answers are "NO", then may proceed with Cephalosporin use.  Has patient had a PCN reaction causing immediate rash, facial/tongue/throat swelling, SOB or lightheadedness with hypotension: Unknown Has patient had a PCN reaction causing severe rash  involving mucus membranes or skin necrosis: Unknown Has patient had a PCN reaction that required hospitalization: Unknown Has patient had a PCN reaction occurring within the last 10 years: No If all of the above answers are "NO", then may proceed with Cephalosporin use.    Social History   Socioeconomic History   Marital status: Single    Spouse name: Not on file   Number of  children: Not on file   Years of education: Not on file   Highest education level: Not on file  Occupational History   Not on file  Tobacco Use   Smoking status: Never   Smokeless tobacco: Never  Vaping Use   Vaping Use: Never used  Substance and Sexual Activity   Alcohol use: Yes    Comment: social    Drug use: No   Sexual activity: Yes    Birth control/protection: Condom, Surgical  Other Topics Concern   Not on file  Social History Narrative   Not on file   Social Determinants of Health   Financial Resource Strain: Not on file  Food Insecurity: Not on file  Transportation Needs: Not on file  Physical Activity: Not on file  Stress: Not on file  Social Connections: Not on file  Intimate Partner Violence: Not on file    Family History  Problem Relation Age of Onset   Diabetes Mother    Hypertension Mother    Kidney disease Father    Healthy Brother    Healthy Son    Healthy Son    Healthy Son     Past Surgical History:  Procedure Laterality Date   CESAREAN SECTION     x 3   CHOLECYSTECTOMY N/A 04/25/2012   Procedure: LAPAROSCOPIC CHOLECYSTECTOMY WITH INTRAOPERATIVE CHOLANGIOGRAM;  Surgeon: Edward Jolly, MD;  Location: [facility];  Service: General;  Laterality: N/A;   hydrocephalus surgery     TUBAL LIGATION      ROS: Review of Systems Negative except as stated above  PHYSICAL EXAM: BP 132/90 (BP Location: Left Arm, Patient Position: Sitting, Cuff Size: Large)   Pulse 86   Temp 98.1 F (36.7 C)   Resp 18   Ht 5' 4.02" (1.626 m)   Wt 221 lb 12.8 oz (100.6 kg)   SpO2 98%   BMI 38.05 kg/m   Physical Exam HENT:     Head: Normocephalic and atraumatic.  Eyes:     Extraocular Movements: Extraocular movements intact.     Conjunctiva/sclera: Conjunctivae normal.     Pupils: Pupils are equal, round, and reactive to light.  Cardiovascular:     Rate and Rhythm: Normal rate and regular rhythm.     Pulses: Normal pulses.     Heart sounds: Normal heart  sounds.  Pulmonary:     Effort: Pulmonary effort is normal.     Breath sounds: Normal breath sounds.  Musculoskeletal:     Cervical back: Normal range of motion and neck supple.  Neurological:     General: No focal deficit present.     Mental Status: She is alert and oriented to person, place, and time.  Psychiatric:        Mood and Affect: Mood normal.        Behavior: Behavior normal.    ASSESSMENT AND PLAN: 1. Type 2 diabetes mellitus with diabetic mononeuropathy, with long-term current use of insulin ([place]): - Continue Metformin and Insulin Glargine as prescribed.  - Begin Glimepiride as prescribed.  - Discussed the importance of healthy  eating habits, low-carbohydrate diet, low-sugar diet, regular aerobic exercise (at least 150 minutes a week as tolerated) and medication compliance to achieve or maintain control of diabetes. - Follow-up with primary provider in 8 weeks or sooner if needed.  - glucose blood (ACCU-CHEK GUIDE) test strip; Test 3 times daily DxE11.65  Dispense: 100 each; Refill: 2 - Continuous Blood Gluc Sensor (FREESTYLE LIBRE 2 SENSOR) MISC; USE AS DIRECTED  Dispense: 16 each; Refill: 0 - losartan (COZAAR) 50 MG tablet; Take 1 tablet (50 mg total) by mouth daily.  Dispense: 90 tablet; Refill: 0  2. Microalbuminuria: - Continue Losartan as prescribed.  - Follow-up with primary provider as scheduled.  - losartan (COZAAR) 50 MG tablet; Take 1 tablet (50 mg total) by mouth daily.  Dispense: 90 tablet; Refill: 0    Patient was given the opportunity to ask questions.  Patient verbalized understanding of the plan and was able to repeat key elements of the plan. Patient was given clear instructions to go to Emergency Department or return to medical center if symptoms don't improve, worsen, or new problems develop.The patient verbalized understanding.   Requested Prescriptions   Signed Prescriptions Disp Refills   glucose blood (ACCU-CHEK GUIDE) test strip 100 each 2     Sig: Test 3 times daily DxE11.65   Continuous Blood Gluc Sensor (FREESTYLE LIBRE 2 SENSOR) MISC 16 each 0    Sig: USE AS DIRECTED   losartan (COZAAR) 50 MG tablet 90 tablet 0    Sig: Take 1 tablet (50 mg total) by mouth daily.    Return in about 8 weeks (around 02/04/2021) for Follow-Up or next available diabetes .  Camillia Herter, NP

## 2020-12-10 ENCOUNTER — Ambulatory Visit (INDEPENDENT_AMBULATORY_CARE_PROVIDER_SITE_OTHER): Payer: Medicaid Other | Admitting: Family

## 2020-12-10 ENCOUNTER — Other Ambulatory Visit: Payer: Self-pay

## 2020-12-10 VITALS — BP 132/90 | HR 86 | Temp 98.1°F | Resp 18 | Ht 64.02 in | Wt 221.8 lb

## 2020-12-10 DIAGNOSIS — R809 Proteinuria, unspecified: Secondary | ICD-10-CM

## 2020-12-10 DIAGNOSIS — Z794 Long term (current) use of insulin: Secondary | ICD-10-CM | POA: Diagnosis not present

## 2020-12-10 DIAGNOSIS — E1141 Type 2 diabetes mellitus with diabetic mononeuropathy: Secondary | ICD-10-CM

## 2020-12-10 MED ORDER — FREESTYLE LIBRE 2 SENSOR MISC: 0 refills | Status: DC

## 2020-12-10 MED ORDER — ACCU-CHEK GUIDE VI STRP: ORAL_STRIP | 2 refills | Status: DC

## 2020-12-10 MED ORDER — LOSARTAN POTASSIUM 50 MG PO TABS: 50.0000 mg | ORAL_TABLET | Freq: Every day | ORAL | 0 refills | Status: DC

## 2020-12-23 ENCOUNTER — Ambulatory Visit
Admission: EM | Admit: 2020-12-23 | Discharge: 2020-12-23 | Disposition: A | Discharge status: Home / Self Care | Payer: Medicaid Other | Attending: Physician Assistant | Admitting: Physician Assistant

## 2020-12-23 ENCOUNTER — Encounter: Payer: Self-pay | Admitting: Emergency Medicine

## 2020-12-23 ENCOUNTER — Other Ambulatory Visit: Payer: Self-pay

## 2020-12-23 DIAGNOSIS — L03011 Cellulitis of right finger: Secondary | ICD-10-CM | POA: Diagnosis not present

## 2020-12-23 MED ORDER — DOXYCYCLINE HYCLATE 100 MG PO CAPS: 100.0000 mg | ORAL_CAPSULE | Freq: Two times a day (BID) | ORAL | 0 refills | Status: DC

## 2020-12-23 NOTE — ED Provider Notes (Signed)
[facility]    CSN: [id number] Arrival date & time: 12/23/20  0946      History   Chief Complaint Chief Complaint  Patient presents with   Finger Injury    HPI April Byrd is a 45 y.o. female.   Patient here today for evaluation of swelling to her right index finger that she first noticed a week ago.  She states she is unsure of any known injury or cuts, however she did have swelling developed after she had been cleaning her oven.  She had tried using a peroxide soak without significant relief.  She has not had fever.  The history is provided by the patient.   Past Medical History:  Diagnosis Date   Asthma    sports induced   BMI 37.0-37.9,adult 04/26/2012   Calculus of gallbladder with acute cholecystitis 04/26/2012   Calculus of gallbladder with acute cholecystitis, without mention of obstruction 04/26/2012   Type II or unspecified type diabetes mellitus without mention of complication, not stated as uncontrolled 04/26/2012   Unspecified asthma(493.90) 04/26/2012   Unspecified essential hypertension 04/26/2012    Patient Active Problem List   Diagnosis Date Noted   Microalbuminuria 06/29/2020   Elevated blood pressure reading in office without diagnosis of hypertension 01/09/2020   Migraine without aura and with status migrainosus, not intractable 01/09/2020   Diabetes ([place]) 12/11/2018   Vulvovaginal candidiasis 10/10/2018   Abnormal uterine bleeding (AUB) 10/10/2018   Primary osteoarthritis of both feet 02/12/2018   Primary osteoarthritis of both knees 02/12/2018   Primary osteoarthritis of both hands 02/12/2018   Unspecified asthma(493.90) 04/26/2012   BMI 37.0-37.9,adult 04/26/2012    Past Surgical History:  Procedure Laterality Date   CESAREAN SECTION     x 3   CHOLECYSTECTOMY N/A 04/25/2012   Procedure: LAPAROSCOPIC CHOLECYSTECTOMY WITH INTRAOPERATIVE CHOLANGIOGRAM;  Surgeon: Edward Jolly, MD;  Location: [facility];  Service: General;  Laterality:  N/A;   hydrocephalus surgery     TUBAL LIGATION      OB History     Gravida  6   Para  3   Term  3   Preterm      AB  3   Living  3      SAB  3   IAB      Ectopic      Multiple      Live Births           Obstetric Comments  c-section x 3.           Home Medications    Prior to Admission medications   Medication Sig Start Date End Date Taking? 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 Use to check blood sugars 3 times daily. OVZ85.88 07/08/19  Yes Fulp, Cammie, MD  Continuous Blood Gluc Receiver (FREESTYLE LIBRE READER) DEVI 1 Device by Does not apply route daily. 06/29/20  Yes Nicolette Bang, MD  Continuous Blood Gluc Sensor (FREESTYLE LIBRE 2 SENSOR) MISC USE AS DIRECTED 12/10/20  Yes Camillia Herter, NP  doxycycline (VIBRAMYCIN) 100 MG capsule Take 1 capsule (100 mg total) by mouth 2 (two) times daily. 12/23/20  Yes Francene Finders, PA-C  gabapentin (NEURONTIN) 300 MG capsule Take 1 capsule (300 mg total) by mouth  at bedtime. 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 11/13/20  Yes Minette Brine, Amy J, NP  Insulin Pen Needle (PEN NEEDLES) 31G X 8 MM MISC UAD 11/13/20  Yes Minette Brine, Amy J, NP  losartan (COZAAR) 50 MG tablet Take 1 tablet (50 mg total) by mouth daily. 12/10/20  Yes Minette Brine, Amy J, NP  metFORMIN (GLUCOPHAGE) 500 MG tablet Take 2 tablets (1,000 mg total) by mouth 2 (two) times daily with a meal. 11/13/20 02/11/21 Yes Minette Brine, Amy J, NP  glimepiride (AMARYL) 4 MG tablet Take 2 tablets (8 mg total) by mouth daily before breakfast. Must  eat before taking medication 11/13/20 12/13/20  Camillia Herter, NP  phentermine 15 MG capsule Take 1 capsule (15 mg total) by mouth every morning. 08/12/20 09/11/20  Camillia Herter, NP    Family History Family History  Problem Relation Age of Onset   Diabetes Mother    Hypertension Mother    Kidney disease Father    Healthy Brother    Healthy Son    Healthy Son    Healthy Son     Social History Social History   Tobacco Use   Smoking status: Never   Smokeless tobacco: Never  Vaping Use   Vaping Use: Never used  Substance Use Topics   Alcohol use: Yes    Comment: social    Drug use: No     Allergies   Penicillins   Review of Systems Review of Systems  Constitutional:  Negative for chills and fever.  Eyes:  Negative for discharge and redness.  Respiratory:  Negative for shortness of breath.   Gastrointestinal:  Negative for abdominal pain, nausea and vomiting.  Genitourinary:  Positive for vaginal bleeding and vaginal discharge.  Musculoskeletal:  Negative for joint swelling.  Skin:  Negative for color change.  Neurological:  Negative for numbness.    Physical Exam Triage Vital Signs ED Triage Vitals  Enc Vitals Group     BP 12/23/20 1011 (!) 151/103     Pulse Rate 12/23/20 1011 93     Resp --      Temp 12/23/20 1011 98 F (36.7 C)     Temp Source 12/23/20 1011 Oral     SpO2 12/23/20 1011 97 %     Weight 12/23/20 1012 215 lb (97.5 kg)     Height 12/23/20 1012 _0  (1.626 m)     Head Circumference --      Peak Flow --      Pain Score 12/23/20 1012 6     Pain Loc --      Pain Edu? --      Excl. in [place]? --    No data found.  Updated Vital Signs BP (!) 151/103 (BP Location: Left Arm)   Pulse 93   Temp 98 F (36.7 C) (Oral)   Ht _1  (1.626 m)   Wt 215 lb (97.5 kg)   LMP 12/18/2020   SpO2 97%   BMI 36.90 kg/m   Physical Exam Vitals and nursing note reviewed.  Constitutional:      General: She is not in acute distress.    Appearance: Normal  appearance. She is not ill-appearing.  HENT:     Head: Normocephalic and atraumatic.  Eyes:     Conjunctiva/sclera: Conjunctivae normal.  Cardiovascular:     Rate and Rhythm: Normal rate.  Pulmonary:     Effort: Pulmonary effort is normal.  Musculoskeletal:     Comments: Diffuse swelling to right index finger. TTP to same  Skin:    Comments: Normal cap refill to right fingers  Neurological:     Mental Status: She is alert.     Comments: Gross sensation intact to distal right index finger  Psychiatric:        Mood and Affect: Mood normal.        Behavior: Behavior normal.        Thought Content: Thought content normal.     UC Treatments / Results  Labs (all labs ordered are listed, but only abnormal results are displayed) Labs Reviewed - No data to display  EKG   Radiology No results found.  Procedures Procedures (including critical care time)  Medications Ordered in UC Medications - No data to display  Initial Impression / Assessment and Plan / UC Course  I have reviewed the triage vital signs and the nursing notes.  Pertinent labs & imaging results that were available during my care of the patient were reviewed by me and considered in my medical decision making (see chart for details).   Will treat with doxycycline to cover cellulitis. Recommend follow up if no improvement or if symptoms worsen  Final Clinical Impressions(s) / UC Diagnoses   Final diagnoses:  Cellulitis of right index finger   Discharge Instructions   None    ED Prescriptions     Medication Sig Dispense Auth. Provider   doxycycline (VIBRAMYCIN) 100 MG capsule Take 1 capsule (100 mg total) by mouth 2 (two) times daily. 20 capsule Francene Finders, PA-C      PDMP not reviewed this encounter.   Francene Finders, PA-C 12/23/20 1133

## 2020-12-23 NOTE — ED Triage Notes (Signed)
Patient c/o right index finger swelling x 1 week.  Patient did have a cut on her finger last week and it could have possibly gotten infected.  Painful.

## 2020-12-28 ENCOUNTER — Ambulatory Visit
Admission: EM | Admit: 2020-12-28 | Discharge: 2020-12-28 | Disposition: A | Discharge status: Home / Self Care | Payer: Medicaid Other | Attending: Physician Assistant | Admitting: Physician Assistant

## 2020-12-28 ENCOUNTER — Ambulatory Visit (INDEPENDENT_AMBULATORY_CARE_PROVIDER_SITE_OTHER): Payer: Medicaid Other

## 2020-12-28 ENCOUNTER — Other Ambulatory Visit: Payer: Self-pay

## 2020-12-28 ENCOUNTER — Encounter: Payer: Self-pay | Admitting: Emergency Medicine

## 2020-12-28 DIAGNOSIS — M79644 Pain in right finger(s): Secondary | ICD-10-CM

## 2020-12-28 DIAGNOSIS — L03011 Cellulitis of right finger: Secondary | ICD-10-CM

## 2020-12-28 DIAGNOSIS — M7989 Other specified soft tissue disorders: Secondary | ICD-10-CM | POA: Diagnosis not present

## 2020-12-28 MED ORDER — CLINDAMYCIN HCL 300 MG PO CAPS: 300.0000 mg | ORAL_CAPSULE | Freq: Three times a day (TID) | ORAL | 0 refills | Status: AC

## 2020-12-28 NOTE — ED Triage Notes (Addendum)
Continued finger swelling from previous injury, seen here for it a week ago. Is a diabetic. Right finger now swollen into hand.  Says she didn't do anything to break the skin initially. States she only got the new spray "Easy-off" oven cleaner on the area, and then she felt her cuticle stinging. So she soaked it in peroxide to clean it. Hasn't gotten her nails done recently. Does not use that finger for blood sugars.

## 2020-12-28 NOTE — ED Provider Notes (Signed)
[facility]    CSN: [id number] Arrival date & time: 12/28/20  1032      History   Chief Complaint Chief Complaint  Patient presents with   Finger Injury    HPI April Byrd is a 45 y.o. female.   Patient here today for continued swelling and pain of her right index finger.  She was seen about a week ago and prescribed doxycycline but has not had improvement with this.  She states that she continues to have some mild worsening of swelling and tenderness to her finger that seems to be spreading somewhat into her second metacarpal area.  She has tried using ice with only mild relief.  The history is provided by the patient.   Past Medical History:  Diagnosis Date   Asthma    sports induced   BMI 37.0-37.9,adult 04/26/2012   Calculus of gallbladder with acute cholecystitis 04/26/2012   Calculus of gallbladder with acute cholecystitis, without mention of obstruction 04/26/2012   Type II or unspecified type diabetes mellitus without mention of complication, not stated as uncontrolled 04/26/2012   Unspecified asthma(493.90) 04/26/2012   Unspecified essential hypertension 04/26/2012    Patient Active Problem List   Diagnosis Date Noted   Microalbuminuria 06/29/2020   Elevated blood pressure reading in office without diagnosis of hypertension 01/09/2020   Migraine without aura and with status migrainosus, not intractable 01/09/2020   Diabetes ([place]) 12/11/2018   Vulvovaginal candidiasis 10/10/2018   Abnormal uterine bleeding (AUB) 10/10/2018   Primary osteoarthritis of both feet 02/12/2018   Primary osteoarthritis of both knees 02/12/2018   Primary osteoarthritis of both hands 02/12/2018   Unspecified asthma(493.90) 04/26/2012   BMI 37.0-37.9,adult 04/26/2012    Past Surgical History:  Procedure Laterality Date   CESAREAN SECTION     x 3   CHOLECYSTECTOMY N/A 04/25/2012   Procedure: LAPAROSCOPIC CHOLECYSTECTOMY WITH INTRAOPERATIVE CHOLANGIOGRAM;  Surgeon: Edward Jolly, MD;  Location: [facility];  Service: General;  Laterality: N/A;   hydrocephalus surgery     TUBAL LIGATION      OB History     Gravida  6   Para  3   Term  3   Preterm      AB  3   Living  3      SAB  3   IAB      Ectopic      Multiple      Live Births           Obstetric Comments  c-section x 3.           Home Medications    Prior to Admission medications   Medication Sig Start Date End Date Taking? Authorizing Provider  clindamycin (CLEOCIN) 300 MG capsule Take 1 capsule (300 mg total) by mouth 3 (three) times daily for 7 days. 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 Use to check blood sugars 3 times daily. WUJ81.19 07/08/19   Fulp, Ander Gaster, MD  Continuous Blood Gluc Receiver (FREESTYLE LIBRE READER) DEVI 1 Device by Does not apply route daily. 06/29/20   Nicolette Bang, MD  Continuous Blood Gluc Sensor (FREESTYLE LIBRE 2 SENSOR) MISC USE AS DIRECTED 12/10/20   Camillia Herter, NP  doxycycline (  VIBRAMYCIN) 100 MG capsule Take 1 capsule (100 mg total) by mouth 2 (two) times daily. 12/23/20   Francene Finders, PA-C  gabapentin (NEURONTIN) 300 MG capsule Take 1 capsule (300 mg total) by mouth at bedtime. 11/13/20   Camillia Herter, NP  glimepiride (AMARYL) 4 MG tablet Take 2 tablets (8 mg total) by mouth daily before breakfast. 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 11/13/20   Camillia Herter, NP  Insulin Pen Needle (PEN NEEDLES) 31G X 8 MM MISC UAD 11/13/20   Camillia Herter, NP   losartan (COZAAR) 50 MG tablet Take 1 tablet (50 mg total) by mouth daily. 12/10/20   Camillia Herter, NP  metFORMIN (GLUCOPHAGE) 500 MG tablet Take 2 tablets (1,000 mg total) by mouth 2 (two) times daily with a meal. 11/13/20 02/11/21  Camillia Herter, NP  phentermine 15 MG capsule Take 1 capsule (15 mg total) by mouth every morning. 08/12/20 09/11/20  Camillia Herter, NP    Family History Family History  Problem Relation Age of Onset   Diabetes Mother    Hypertension Mother    Kidney disease Father    Healthy Brother    Healthy Son    Healthy Son    Healthy Son     Social History Social History   Tobacco Use   Smoking status: Never   Smokeless tobacco: Never  Vaping Use   Vaping Use: Never used  Substance Use Topics   Alcohol use: Yes    Comment: social    Drug use: No     Allergies   Penicillins   Review of Systems Review of Systems  Constitutional:  Negative for chills and fever.  Eyes:  Negative for discharge and redness.  Respiratory:  Negative for shortness of breath.   Musculoskeletal:  Positive for arthralgias.  Skin:  Positive for color change. Negative for wound.    Physical Exam Triage Vital Signs ED Triage Vitals  Enc Vitals Group     BP 12/28/20 1049 (!) 164/107     Pulse Rate 12/28/20 1049 94     Resp 12/28/20 1049 16     Temp 12/28/20 1049 98.2 F (36.8 C)     Temp Source 12/28/20 1049 Oral     SpO2 12/28/20 1049 98 %     Weight --      Height --      Head Circumference --      Peak Flow --      Pain Score 12/28/20 1052 9     Pain Loc --      Pain Edu? --      Excl. in [place]? --    No data found.  Updated Vital Signs BP (!) 164/107 (BP Location: Left Arm)   Pulse 94   Temp 98.2 F (36.8 C) (Oral)   Resp 16   LMP 12/18/2020   SpO2 98%     Physical Exam Vitals and nursing note reviewed.  Constitutional:      General: She is not in acute distress.    Appearance: Normal appearance. She is not ill-appearing.  HENT:     Head:  Normocephalic and atraumatic.  Eyes:     Conjunctiva/sclera: Conjunctivae normal.  Cardiovascular:     Rate  and Rhythm: Normal rate.  Pulmonary:     Effort: Pulmonary effort is normal.  Musculoskeletal:     Comments: Diffuse swelling to right index finger. TTP to same  Neurological:     Mental Status: She is alert.  Psychiatric:        Mood and Affect: Mood normal.        Behavior: Behavior normal.        Thought Content: Thought content normal.     UC Treatments / Results  Labs (all labs ordered are listed, but only abnormal results are displayed) Labs Reviewed - No data to display  EKG   Radiology DG Finger Index Right  Result Date: 12/28/2020 CLINICAL DATA:  Pain and swelling at the tip of the index finger EXAM: RIGHT INDEX FINGER 2+V COMPARISON:  02/05/2018 FINDINGS: There is no evidence of fracture or dislocation. There is no evidence of arthropathy or other focal bone abnormality. No radiodense foreign body. Soft tissues are unremarkable. IMPRESSION: Negative. Electronically Signed   By: Lucrezia Europe M.D.   On: 12/28/2020 11:13    Procedures Procedures (including critical care time)  Medications Ordered in UC Medications - No data to display  Initial Impression / Assessment and Plan / UC Course  I have reviewed the triage vital signs and the nursing notes.  Pertinent labs & imaging results that were available during my care of the patient were reviewed by me and considered in my medical decision making (see chart for details).    Xray with no concerning findings. Discussed attempt to drain possible paronychia but patient refuses this today. We will trial clindamycin but strict instruction given to report to ED with any worsening or if symptoms are not improved over the next 48 hours with clindamycin treatment. Patient expresses understanding.   Final Clinical Impressions(s) / UC Diagnoses   Final diagnoses:  Cellulitis of finger of right hand   Discharge  Instructions   None    ED Prescriptions     Medication Sig Dispense Auth. Provider   clindamycin (CLEOCIN) 300 MG capsule Take 1 capsule (300 mg total) by mouth 3 (three) times daily for 7 days. 21 capsule Francene Finders, PA-C      PDMP not reviewed this encounter.   Francene Finders, PA-C 12/28/20 1128

## 2020-12-30 ENCOUNTER — Emergency Department (HOSPITAL_COMMUNITY)
Admission: EM | Admit: 2020-12-30 | Discharge: 2020-12-30 | Disposition: A | Discharge status: Home / Self Care | Payer: Medicaid Other | Attending: Student | Admitting: Student

## 2020-12-30 DIAGNOSIS — Z7984 Long term (current) use of oral hypoglycemic drugs: Secondary | ICD-10-CM | POA: Diagnosis not present

## 2020-12-30 DIAGNOSIS — Z79899 Other long term (current) drug therapy: Secondary | ICD-10-CM | POA: Diagnosis not present

## 2020-12-30 DIAGNOSIS — Z794 Long term (current) use of insulin: Secondary | ICD-10-CM | POA: Diagnosis not present

## 2020-12-30 DIAGNOSIS — M7989 Other specified soft tissue disorders: Secondary | ICD-10-CM | POA: Diagnosis present

## 2020-12-30 DIAGNOSIS — I10 Essential (primary) hypertension: Secondary | ICD-10-CM | POA: Diagnosis not present

## 2020-12-30 DIAGNOSIS — J45909 Unspecified asthma, uncomplicated: Secondary | ICD-10-CM | POA: Diagnosis not present

## 2020-12-30 DIAGNOSIS — L03011 Cellulitis of right finger: Secondary | ICD-10-CM | POA: Diagnosis not present

## 2020-12-30 DIAGNOSIS — E119 Type 2 diabetes mellitus without complications: Secondary | ICD-10-CM | POA: Diagnosis not present

## 2020-12-30 MED ORDER — BACITRACIN ZINC 500 UNIT/GM EX OINT: 1.0000 "application " | TOPICAL_OINTMENT | Freq: Three times a day (TID) | CUTANEOUS | 0 refills | Status: DC

## 2020-12-30 MED ORDER — HYDROCODONE-ACETAMINOPHEN 5-325 MG PO TABS
1.0000 | ORAL_TABLET | Freq: Once | ORAL | Status: AC
  Administered 2020-12-30: 1 via ORAL
  Filled 2020-12-30: qty 1

## 2020-12-30 MED ORDER — BACITRACIN ZINC 500 UNIT/GM EX OINT
TOPICAL_OINTMENT | Freq: Once | CUTANEOUS | Status: AC
  Administered 2020-12-30: 1 via TOPICAL
  Filled 2020-12-30: qty 0.9

## 2020-12-30 MED ORDER — LORAZEPAM 1 MG PO TABS
0.5000 mg | ORAL_TABLET | Freq: Once | ORAL | Status: AC
  Administered 2020-12-30: 0.5 mg via ORAL
  Filled 2020-12-30: qty 1

## 2020-12-30 MED ORDER — LIDOCAINE HCL (PF) 1 % IJ SOLN
30.0000 mL | Freq: Once | INTRAMUSCULAR | Status: AC
  Administered 2020-12-30: 30 mL
  Filled 2020-12-30: qty 30

## 2020-12-30 MED ORDER — SULFAMETHOXAZOLE-TRIMETHOPRIM 800-160 MG PO TABS: 1.0000 | ORAL_TABLET | Freq: Two times a day (BID) | ORAL | 0 refills | Status: DC

## 2020-12-30 NOTE — ED Triage Notes (Signed)
Pt. Stated, I was cleaning my stove and I must of got something under my cuticle. Ive gone o UC x 2 and put on antibiotic. Its not better.  Finger is red and swollen and red , the finger nail is full of white pus.

## 2020-12-30 NOTE — ED Provider Notes (Signed)
[facility] EMERGENCY DEPARTMENT Provider Note   CSN: [id number] Arrival date & time: 12/30/20  0731     History Chief Complaint  Patient presents with   Finger Injury    April Byrd is a 45 y.o. female.  With past medical history of hypertension, asthma who presents to the emergency department with finger injury.  She states that on Thanksgiving Thursday she was cleaning the stove when she noticed a sharp burning sensation on her right index finger.  She did not note any injury however she states she washed her hands and thought no more of it.  She states that the days following she began having redness and swelling of her finger.  She went to urgent care on 12/23/2020.  They diagnosed her with cellulitis and gave her of course of doxycycline.  She then followed up with urgent care on 12/28/2020 for continued swelling and pain.  States that the swelling was starting to move down her finger.  At that time she received x-rays right index finger which was negative for fracture, dislocation.  She was offered drainage at that time but she refused it.  They started her on a trial clindamycin with instruction to report to the emergency department for any worsening symptoms.  She denies any fever.  She is diabetic.  States that her sugars are under control.  HPI     Past Medical History:  Diagnosis Date   Asthma    sports induced   BMI 37.0-37.9,adult 04/26/2012   Calculus of gallbladder with acute cholecystitis 04/26/2012   Calculus of gallbladder with acute cholecystitis, without mention of obstruction 04/26/2012   Type II or unspecified type diabetes mellitus without mention of complication, not stated as uncontrolled 04/26/2012   Unspecified asthma(493.90) 04/26/2012   Unspecified essential hypertension 04/26/2012    Patient Active Problem List   Diagnosis Date Noted   Microalbuminuria 06/29/2020   Elevated blood pressure reading in office without diagnosis of hypertension  01/09/2020   Migraine without aura and with status migrainosus, not intractable 01/09/2020   Diabetes ([place]) 12/11/2018   Vulvovaginal candidiasis 10/10/2018   Abnormal uterine bleeding (AUB) 10/10/2018   Primary osteoarthritis of both feet 02/12/2018   Primary osteoarthritis of both knees 02/12/2018   Primary osteoarthritis of both hands 02/12/2018   Unspecified asthma(493.90) 04/26/2012   BMI 37.0-37.9,adult 04/26/2012    Past Surgical History:  Procedure Laterality Date   CESAREAN SECTION     x 3   CHOLECYSTECTOMY N/A 04/25/2012   Procedure: LAPAROSCOPIC CHOLECYSTECTOMY WITH INTRAOPERATIVE CHOLANGIOGRAM;  Surgeon: Edward Jolly, MD;  Location: [facility];  Service: General;  Laterality: N/A;   hydrocephalus surgery     TUBAL LIGATION       OB History     Gravida  6   Para  3   Term  3   Preterm      AB  3   Living  3      SAB  3   IAB      Ectopic      Multiple      Live Births           Obstetric Comments  c-section x 3.          Family History  Problem Relation Age of Onset   Diabetes Mother    Hypertension Mother    Kidney disease Father    Healthy Brother    Healthy Son    Healthy Son    Healthy  Son     Social History   Tobacco Use   Smoking status: Never   Smokeless tobacco: Never  Vaping Use   Vaping Use: Never used  Substance Use Topics   Alcohol use: Yes    Comment: social    Drug use: No    Home Medications Prior to Admission medications   Medication Sig Start Date End Date Taking? Authorizing Provider  Accu-Chek FastClix Lancets MISC USE AS DIRECTED ONCE DAILY TO  TEST  BLOOD  SUGAR DXE11.65 07/05/19   Fulp, Cammie, MD  Blood Glucose Monitoring Suppl (ACCU-CHEK AVIVA PLUS) w/Device KIT 1 each by Does not apply route 3 (three) times daily. Use to check blood sugars 3 times daily. OXB35.32 07/08/19   Fulp, Cammie, MD  clindamycin (CLEOCIN) 300 MG capsule Take 1 capsule (300 mg total) by mouth 3 (three) times daily for 7  days. 12/28/20 01/04/21  Francene Finders, PA-C  Continuous Blood Gluc Receiver (FREESTYLE LIBRE READER) DEVI 1 Device by Does not apply route daily. 06/29/20   Nicolette Bang, MD  Continuous Blood Gluc Sensor (FREESTYLE LIBRE 2 SENSOR) MISC USE AS DIRECTED 12/10/20   Camillia Herter, NP  doxycycline (VIBRAMYCIN) 100 MG capsule Take 1 capsule (100 mg total) by mouth 2 (two) times daily. 12/23/20   Francene Finders, PA-C  gabapentin (NEURONTIN) 300 MG capsule Take 1 capsule (300 mg total) by mouth at bedtime. 11/13/20   Camillia Herter, NP  glimepiride (AMARYL) 4 MG tablet Take 2 tablets (8 mg total) by mouth daily before breakfast. 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 11/13/20   Camillia Herter, NP  Insulin Pen Needle (PEN NEEDLES) 31G X 8 MM MISC UAD 11/13/20   Camillia Herter, NP  losartan (COZAAR) 50 MG tablet Take 1 tablet (50 mg total) by mouth daily. 12/10/20   Camillia Herter, NP  metFORMIN (GLUCOPHAGE) 500 MG tablet Take 2 tablets (1,000 mg total) by mouth 2 (two) times daily with a meal. 11/13/20 02/11/21  Camillia Herter, NP  phentermine 15 MG capsule Take 1 capsule (15 mg total) by mouth every morning. 08/12/20 09/11/20  Camillia Herter, NP    Allergies    Penicillins  Review of Systems   Review of Systems  Constitutional:  Negative for fever.  Skin:  Positive for wound.  All other systems reviewed and are negative.  Physical Exam Updated Vital Signs BP 109/82 (BP Location: Left Arm)   Pulse 95   Temp 98.4 F (36.9 C)   Resp 15   LMP 12/18/2020   SpO2 99%   Physical Exam Vitals and nursing note reviewed.  Constitutional:      General: She is not in acute distress.    Appearance: Normal appearance. She is not toxic-appearing.  HENT:      Head: Normocephalic and atraumatic.  Eyes:     General: No scleral icterus. Cardiovascular:     Pulses: Normal pulses.  Pulmonary:     Effort: Pulmonary effort is normal. No respiratory distress.  Musculoskeletal:        General: Swelling and tenderness present.     Right hand: Swelling and tenderness present. Decreased range of motion. Normal pulse.     Left hand: Normal.  Comments: Paronychia to the right index finger that extends under the nailbed.  Further swelling and tightness of the finger down to the level of the PCP.  There is no redness or fluctuance below the paronychia.    Skin:    Capillary Refill: Capillary refill takes less than 2 seconds.     Findings: Erythema present.  Neurological:     General: No focal deficit present.     Mental Status: She is alert and oriented to person, place, and time. Mental status is at baseline.  Psychiatric:        Mood and Affect: Mood normal.        Behavior: Behavior normal.        Thought Content: Thought content normal.        Judgment: Judgment normal.    ED Results / Procedures / Treatments   Labs (all labs ordered are listed, but only abnormal results are displayed) Labs Reviewed - No data to display  EKG None  Radiology No results found.  Procedures Drain paronychia  Date/Time: 12/30/2020 1:42 PM Performed by: Mickie Hillier, PA-C Authorized by: Mickie Hillier, PA-C  Consent: Verbal consent obtained. Written consent not obtained. Risks and benefits: risks, benefits and alternatives were discussed Consent given by: patient Patient understanding: patient states understanding of the procedure being performed Patient consent: the patient's understanding of the procedure matches consent given Procedure consent: procedure consent matches procedure scheduled Relevant documents: relevant documents present and verified Test results: test results available and properly labeled Site marked: the operative site was  marked Imaging studies: imaging studies available Patient identity confirmed: verbally with patient Preparation: Patient was prepped and draped in the usual sterile fashion. Local anesthesia used: yes Anesthesia: digital block  Anesthesia: Local anesthesia used: yes Local Anesthetic: lidocaine 1% without epinephrine Anesthetic total: 3 mL  Sedation: Patient sedated: no  Patient tolerance: patient tolerated the procedure well with no immediate complications   .Nerve Block  Date/Time: 12/30/2020 1:43 PM Performed by: Mickie Hillier, PA-C Authorized by: Mickie Hillier, PA-C   Consent:    Consent obtained:  Verbal   Consent given by:  Patient   Risks, benefits, and alternatives were discussed: yes     Risks discussed:  Unsuccessful block and pain   Alternatives discussed:  No treatment and delayed treatment Universal protocol:    Procedure explained and questions answered to patient or proxy's satisfaction: yes     Relevant documents present and verified: yes     Test results available: yes     Imaging studies available: yes     Required blood products, implants, devices, and special equipment available: yes     Site/side marked: yes     Immediately prior to procedure, a time out was called: yes     Patient identity confirmed:  Verbally with patient Indications:    Indications:  Pain relief and procedural anesthesia Location:    Body area:  Upper extremity   Upper extremity nerve:  Metacarpal   Laterality:  Right Pre-procedure details:    Skin preparation:  Povidone-iodine   Preparation: Patient was prepped and draped in usual sterile fashion   Skin anesthesia:    Skin anesthesia method:  Local infiltration   Local anesthetic:  Lidocaine 1% w/o epi Procedure details:    Block needle gauge:  25 G   Anesthetic injected:  Lidocaine 1% w/o epi   Steroid injected:  None   Additive injected:  None   Injection procedure:  Anatomic landmarks identified, incremental  injection, negative aspiration for blood and introduced needle   Paresthesia:  None Post-procedure details:    Dressing:  None   Outcome:  Anesthesia achieved   Procedure completion:  Tolerated well, no immediate complications   Medications Ordered in ED Medications  lidocaine (PF) (XYLOCAINE) 1 % injection 30 mL (has no administration in time range)  HYDROcodone-acetaminophen (NORCO/VICODIN) 5-325 MG per tablet 1 tablet (has no administration in time range)  bacitracin ointment (has no administration in time range)  LORazepam (ATIVAN) tablet 0.5 mg (0.5 mg Oral Given 12/30/20 1235)   ED Course  I have reviewed the triage vital signs and the nursing notes.  Pertinent labs & imaging results that were available during my care of the patient were reviewed by me and considered in my medical decision making (see chart for details).    MDM Rules/Calculators/A&P 45 year old female who presents to the emergency department with paronychia  Failed outpatient antibiotic treatment. Patient provided 0.5 mg oral Ativan for anxiety prior to her procedure.  Performed a digital block of the right index finger.  Incision and drainage was performed at the paronychia.  Moderate amount of pus drained.  Also moderate amount of blood drained from the area. Patient provided with Norco after the procedure. Area covered with bacitracin and pressure dressing.  Will provide Bactrim for the next 7 days.  She is already taking clindamycin however monotherapy with clindamycin may not be adequate. She is instructed to use bacitracin 3 times a day while she is on antibiotics. May return for continued swelling, reaccumulation of pus, fevers Final Clinical Impression(s) / ED Diagnoses Final diagnoses:  Paronychia of right index finger    Rx / DC Orders ED Discharge Orders          Ordered    sulfamethoxazole-trimethoprim (BACTRIM DS) 800-160 MG tablet  2 times daily        12/30/20 1355    bacitracin ointment   3 times daily        12/30/20 1355             Mickie Hillier, PA-C 12/30/20 1356    Kommor, Debe Coder, MD 12/30/20 1454

## 2020-12-30 NOTE — Discharge Instructions (Addendum)
You were seen in the emergency department today for a paronychia which is a collection of pus under the nail and around the nail.  While you are here we performed a procedure where we removed the pus.  Your finger will likely be sore over the next coming days.  We are prescribing you another antibiotic to take along with your clindamycin over the next 7 days.  Please complete both of these antibiotics.  Please continue to apply bacitracin ointment to your finger 3 times a day over the next 5 to 10 days.  You may also use ibuprofen 800 mg every 8 hours over the next few days to reduce some of the swelling and pain.  Please return to the emergency department for recollection of pus, fevers, worsening of your swelling.

## 2021-01-06 ENCOUNTER — Encounter: Payer: Self-pay | Admitting: Emergency Medicine

## 2021-01-06 ENCOUNTER — Encounter (HOSPITAL_COMMUNITY)
Admission: EM | Disposition: A | Discharge status: Home / Self Care | Payer: Self-pay | Source: Home / Self Care | Attending: Emergency Medicine

## 2021-01-06 ENCOUNTER — Emergency Department (HOSPITAL_COMMUNITY): Payer: Medicaid Other | Admitting: Certified Registered Nurse Anesthetist

## 2021-01-06 ENCOUNTER — Emergency Department (HOSPITAL_COMMUNITY): Payer: Medicaid Other

## 2021-01-06 ENCOUNTER — Ambulatory Visit
Admission: EM | Admit: 2021-01-06 | Discharge: 2021-01-06 | Disposition: A | Discharge status: Home / Self Care | Payer: Medicaid Other

## 2021-01-06 ENCOUNTER — Other Ambulatory Visit: Payer: Self-pay

## 2021-01-06 ENCOUNTER — Encounter (HOSPITAL_COMMUNITY): Payer: Self-pay

## 2021-01-06 ENCOUNTER — Ambulatory Visit (HOSPITAL_COMMUNITY)
Admission: EM | Admit: 2021-01-06 | Discharge: 2021-01-06 | Disposition: A | Discharge status: Home / Self Care | Payer: Medicaid Other | Attending: Emergency Medicine | Admitting: Emergency Medicine

## 2021-01-06 DIAGNOSIS — Z79899 Other long term (current) drug therapy: Secondary | ICD-10-CM | POA: Diagnosis not present

## 2021-01-06 DIAGNOSIS — L089 Local infection of the skin and subcutaneous tissue, unspecified: Secondary | ICD-10-CM | POA: Diagnosis not present

## 2021-01-06 DIAGNOSIS — L03011 Cellulitis of right finger: Secondary | ICD-10-CM

## 2021-01-06 DIAGNOSIS — L02511 Cutaneous abscess of right hand: Secondary | ICD-10-CM | POA: Diagnosis not present

## 2021-01-06 DIAGNOSIS — I10 Essential (primary) hypertension: Secondary | ICD-10-CM | POA: Insufficient documentation

## 2021-01-06 DIAGNOSIS — Z7984 Long term (current) use of oral hypoglycemic drugs: Secondary | ICD-10-CM | POA: Insufficient documentation

## 2021-01-06 DIAGNOSIS — Z20822 Contact with and (suspected) exposure to covid-19: Secondary | ICD-10-CM | POA: Diagnosis not present

## 2021-01-06 DIAGNOSIS — E1165 Type 2 diabetes mellitus with hyperglycemia: Secondary | ICD-10-CM | POA: Insufficient documentation

## 2021-01-06 DIAGNOSIS — Z794 Long term (current) use of insulin: Secondary | ICD-10-CM | POA: Diagnosis not present

## 2021-01-06 DIAGNOSIS — S60410A Abrasion of right index finger, initial encounter: Secondary | ICD-10-CM

## 2021-01-06 HISTORY — PX: I & D EXTREMITY: SHX5045

## 2021-01-06 LAB — COMPREHENSIVE METABOLIC PANEL
ALT: 14 U/L (ref 0–44)
AST: 21 U/L (ref 15–41)
Albumin: 3.4 g/dL — ABNORMAL LOW (ref 3.5–5.0)
Alkaline Phosphatase: 69 U/L (ref 38–126)
Anion gap: 7 (ref 5–15)
BUN: 14 mg/dL (ref 6–20)
CO2: 19 mmol/L — ABNORMAL LOW (ref 22–32)
Calcium: 7.9 mg/dL — ABNORMAL LOW (ref 8.9–10.3)
Chloride: 106 mmol/L (ref 98–111)
Creatinine, Ser: 0.96 mg/dL (ref 0.44–1.00)
GFR, Estimated: >60 mL/min (ref >60)
Glucose, Bld: 172 mg/dL — ABNORMAL HIGH (ref 70–99)
Potassium: 4.5 mmol/L (ref 3.5–5.1)
Sodium: 132 mmol/L — ABNORMAL LOW (ref 135–145)
Total Bilirubin: 0.9 mg/dL (ref 0.3–1.2)
Total Protein: 7.7 g/dL (ref 6.5–8.1)

## 2021-01-06 LAB — I-STAT CHEM 8, ED
BUN: 15 mg/dL (ref 6–20)
Calcium, Ion: 1.2 mmol/L (ref 1.15–1.40)
Chloride: 101 mmol/L (ref 98–111)
Creatinine, Ser: 1.1 mg/dL — ABNORMAL HIGH (ref 0.44–1.00)
Glucose, Bld: 189 mg/dL — ABNORMAL HIGH (ref 70–99)
HCT: 42 % (ref 36.0–46.0)
Hemoglobin: 14.3 g/dL (ref 12.0–15.0)
Potassium: 4.6 mmol/L (ref 3.5–5.1)
Sodium: 134 mmol/L — ABNORMAL LOW (ref 135–145)
TCO2: 24 mmol/L (ref 22–32)

## 2021-01-06 LAB — GLUCOSE, CAPILLARY
Glucose-Capillary: 164 mg/dL — ABNORMAL HIGH (ref 70–99)
Glucose-Capillary: 218 mg/dL — ABNORMAL HIGH (ref 70–99)

## 2021-01-06 LAB — I-STAT BETA HCG BLOOD, ED (MC, WL, AP ONLY): I-stat hCG, quantitative: <5 m[IU]/mL (ref <5)

## 2021-01-06 LAB — RESP PANEL BY RT-PCR (FLU A&B, COVID) ARPGX2
Influenza A by PCR: NEGATIVE
Influenza B by PCR: NEGATIVE
SARS Coronavirus 2 by RT PCR: NEGATIVE

## 2021-01-06 LAB — LACTIC ACID, PLASMA: Lactic Acid, Venous: 1.1 mmol/L (ref 0.5–1.9)

## 2021-01-06 SURGERY — IRRIGATION AND DEBRIDEMENT EXTREMITY
Anesthesia: General | Laterality: Right

## 2021-01-06 MED ORDER — LACTATED RINGERS IV SOLN
INTRAVENOUS | Status: DC | PRN
Start: 2021-01-06 — End: 2021-01-06

## 2021-01-06 MED ORDER — ROCURONIUM BROMIDE 10 MG/ML (PF) SYRINGE
PREFILLED_SYRINGE | INTRAVENOUS | Status: AC
  Filled 2021-01-06: qty 10

## 2021-01-06 MED ORDER — CHLORHEXIDINE GLUCONATE 4 % EX LIQD: 60.0000 mL | Freq: Once | CUTANEOUS | Status: DC

## 2021-01-06 MED ORDER — FENTANYL CITRATE (PF) 100 MCG/2ML IJ SOLN
INTRAMUSCULAR | Status: DC | PRN
  Administered 2021-01-06: 25 ug via INTRAVENOUS
  Administered 2021-01-06: 50 ug via INTRAVENOUS
  Administered 2021-01-06: 25 ug via INTRAVENOUS
  Administered 2021-01-06: 150 ug via INTRAVENOUS

## 2021-01-06 MED ORDER — ONDANSETRON HCL 4 MG/2ML IJ SOLN
INTRAMUSCULAR | Status: DC | PRN
  Administered 2021-01-06: 4 mg via INTRAVENOUS

## 2021-01-06 MED ORDER — SUGAMMADEX SODIUM 200 MG/2ML IV SOLN
INTRAVENOUS | Status: DC | PRN
  Administered 2021-01-06: 150 mg via INTRAVENOUS

## 2021-01-06 MED ORDER — ACETAMINOPHEN 10 MG/ML IV SOLN: 1000.0000 mg | Freq: Once | INTRAVENOUS | Status: DC | PRN

## 2021-01-06 MED ORDER — ROCURONIUM BROMIDE 100 MG/10ML IV SOLN
INTRAVENOUS | Status: DC | PRN
  Administered 2021-01-06: 30 mg via INTRAVENOUS

## 2021-01-06 MED ORDER — ONDANSETRON HCL 4 MG/2ML IJ SOLN
INTRAMUSCULAR | Status: AC
  Filled 2021-01-06: qty 2

## 2021-01-06 MED ORDER — FENTANYL CITRATE (PF) 100 MCG/2ML IJ SOLN
25.0000 ug | INTRAMUSCULAR | Status: DC | PRN
Start: 2021-01-06 — End: 2021-01-07
  Administered 2021-01-06: 20:00:00 25 ug via INTRAVENOUS

## 2021-01-06 MED ORDER — DEXAMETHASONE SODIUM PHOSPHATE 10 MG/ML IJ SOLN
INTRAMUSCULAR | Status: DC | PRN
  Administered 2021-01-06: 5 mg via INTRAVENOUS

## 2021-01-06 MED ORDER — FENTANYL CITRATE (PF) 250 MCG/5ML IJ SOLN
INTRAMUSCULAR | Status: AC
  Filled 2021-01-06: qty 5

## 2021-01-06 MED ORDER — HYDROCODONE-ACETAMINOPHEN 5-325 MG PO TABS: 1.0000 | ORAL_TABLET | ORAL | 0 refills | Status: AC | PRN

## 2021-01-06 MED ORDER — DEXAMETHASONE SODIUM PHOSPHATE 10 MG/ML IJ SOLN
INTRAMUSCULAR | Status: AC
  Filled 2021-01-06: qty 1

## 2021-01-06 MED ORDER — MIDAZOLAM HCL 5 MG/5ML IJ SOLN
INTRAMUSCULAR | Status: DC | PRN
  Administered 2021-01-06: 2 mg via INTRAVENOUS

## 2021-01-06 MED ORDER — LIDOCAINE 2% (20 MG/ML) 5 ML SYRINGE
INTRAMUSCULAR | Status: AC
  Filled 2021-01-06: qty 5

## 2021-01-06 MED ORDER — PROPOFOL 10 MG/ML IV BOLUS
INTRAVENOUS | Status: DC | PRN
  Administered 2021-01-06: 180 mg via INTRAVENOUS

## 2021-01-06 MED ORDER — LIDOCAINE HCL (CARDIAC) PF 100 MG/5ML IV SOSY
PREFILLED_SYRINGE | INTRAVENOUS | Status: DC | PRN
  Administered 2021-01-06: 80 mg via INTRAVENOUS

## 2021-01-06 MED ORDER — PROPOFOL 10 MG/ML IV BOLUS
INTRAVENOUS | Status: AC
  Filled 2021-01-06: qty 20

## 2021-01-06 MED ORDER — SULFAMETHOXAZOLE-TRIMETHOPRIM 800-160 MG PO TABS: 1.0000 | ORAL_TABLET | Freq: Two times a day (BID) | ORAL | 0 refills | Status: AC

## 2021-01-06 MED ORDER — SUCCINYLCHOLINE CHLORIDE 200 MG/10ML IV SOSY
PREFILLED_SYRINGE | INTRAVENOUS | Status: DC | PRN
Start: 2021-01-06 — End: 2021-01-06
  Administered 2021-01-06: 140 mg via INTRAVENOUS

## 2021-01-06 MED ORDER — PHENYLEPHRINE HCL (PRESSORS) 10 MG/ML IV SOLN
INTRAVENOUS | Status: DC | PRN
  Administered 2021-01-06: 120 ug via INTRAVENOUS

## 2021-01-06 MED ORDER — FENTANYL CITRATE (PF) 100 MCG/2ML IJ SOLN
INTRAMUSCULAR | Status: AC
  Filled 2021-01-06: qty 2

## 2021-01-06 MED ORDER — VANCOMYCIN HCL IN DEXTROSE 1-5 GM/200ML-% IV SOLN
INTRAVENOUS | Status: AC
  Filled 2021-01-06: qty 200

## 2021-01-06 MED ORDER — PROMETHAZINE HCL 25 MG/ML IJ SOLN
6.2500 mg | INTRAMUSCULAR | Status: DC | PRN
Start: 2021-01-06 — End: 2021-01-07

## 2021-01-06 MED ORDER — POVIDONE-IODINE 10 % EX SWAB: 2.0000 | Freq: Once | CUTANEOUS | Status: DC

## 2021-01-06 MED ORDER — CHLORHEXIDINE GLUCONATE 0.12 % MT SOLN
OROMUCOSAL | Status: AC
  Administered 2021-01-06: 18:00:00 15 mL
  Filled 2021-01-06: qty 15

## 2021-01-06 MED ORDER — BUPIVACAINE HCL (PF) 0.25 % IJ SOLN
INTRAMUSCULAR | Status: AC
  Filled 2021-01-06: qty 30

## 2021-01-06 MED ORDER — SODIUM CHLORIDE 0.9 % IR SOLN
Status: DC | PRN
  Administered 2021-01-06: 1000 mL

## 2021-01-06 MED ORDER — VANCOMYCIN HCL 1000 MG IV SOLR
INTRAVENOUS | Status: DC | PRN
  Administered 2021-01-06: 19:00:00 1000 mg via INTRAVENOUS

## 2021-01-06 MED ORDER — MIDAZOLAM HCL 2 MG/2ML IJ SOLN
INTRAMUSCULAR | Status: AC
  Filled 2021-01-06: qty 2

## 2021-01-06 SURGICAL SUPPLY — 56 items
BAG COUNTER SPONGE SURGICOUNT (BAG) ×2 IMPLANT
BNDG COHESIVE 1X5 TAN STRL LF (GAUZE/BANDAGES/DRESSINGS) ×1 IMPLANT
BNDG CONFORM 2 STRL LF (GAUZE/BANDAGES/DRESSINGS) ×1 IMPLANT
BNDG ELASTIC 3X5.8 VLCR STR LF (GAUZE/BANDAGES/DRESSINGS) ×2 IMPLANT
BNDG ELASTIC 4X5.8 VLCR STR LF (GAUZE/BANDAGES/DRESSINGS) ×2 IMPLANT
BNDG ESMARK 4X9 LF (GAUZE/BANDAGES/DRESSINGS) ×2 IMPLANT
BNDG GAUZE ELAST 4 BULKY (GAUZE/BANDAGES/DRESSINGS) ×2 IMPLANT
CORD BIPOLAR FORCEPS 12FT (ELECTRODE) ×2 IMPLANT
COVER SURGICAL LIGHT HANDLE (MISCELLANEOUS) ×2 IMPLANT
CUFF TOURN SGL QUICK 18X4 (TOURNIQUET CUFF) ×2 IMPLANT
CUFF TOURN SGL QUICK 24 (TOURNIQUET CUFF)
CUFF TRNQT CYL 24X4X16.5-23 (TOURNIQUET CUFF) IMPLANT
DRAIN PENROSE 1/4X12 LTX STRL (WOUND CARE) IMPLANT
DRAPE SURG 17X23 STRL (DRAPES) ×2 IMPLANT
DRSG ADAPTIC 3X8 NADH LF (GAUZE/BANDAGES/DRESSINGS) ×2 IMPLANT
DRSG EMULSION OIL 3X3 NADH (GAUZE/BANDAGES/DRESSINGS) ×1 IMPLANT
ELECT REM PT RETURN 9FT ADLT (ELECTROSURGICAL)
ELECTRODE REM PT RTRN 9FT ADLT (ELECTROSURGICAL) IMPLANT
GAUZE SPONGE 2X2 8PLY NS (GAUZE/BANDAGES/DRESSINGS) ×1 IMPLANT
GAUZE SPONGE 4X4 12PLY STRL (GAUZE/BANDAGES/DRESSINGS) ×2 IMPLANT
GAUZE XEROFORM 1X8 LF (GAUZE/BANDAGES/DRESSINGS) ×2 IMPLANT
GAUZE XEROFORM 5X9 LF (GAUZE/BANDAGES/DRESSINGS) IMPLANT
GLOVE SURG ORTHO LTX SZ8 (GLOVE) ×2 IMPLANT
GLOVE SURG UNDER POLY LF SZ8.5 (GLOVE) ×2 IMPLANT
GOWN STRL REUS W/ TWL LRG LVL3 (GOWN DISPOSABLE) ×3 IMPLANT
GOWN STRL REUS W/ TWL XL LVL3 (GOWN DISPOSABLE) ×1 IMPLANT
GOWN STRL REUS W/TWL LRG LVL3 (GOWN DISPOSABLE) ×3
GOWN STRL REUS W/TWL XL LVL3 (GOWN DISPOSABLE) ×1
HANDPIECE INTERPULSE COAX TIP (DISPOSABLE)
KIT BASIN OR (CUSTOM PROCEDURE TRAY) ×2 IMPLANT
KIT TURNOVER KIT B (KITS) ×2 IMPLANT
MANIFOLD NEPTUNE II (INSTRUMENTS) ×2 IMPLANT
NDL HYPO 25GX1X1/2 BEV (NEEDLE) IMPLANT
NEEDLE HYPO 25GX1X1/2 BEV (NEEDLE) IMPLANT
NS IRRIG 1000ML POUR BTL (IV SOLUTION) ×2 IMPLANT
PACK ORTHO EXTREMITY (CUSTOM PROCEDURE TRAY) ×2 IMPLANT
PAD ARMBOARD 7.5X6 YLW CONV (MISCELLANEOUS) ×4 IMPLANT
PAD CAST 4YDX4 CTTN HI CHSV (CAST SUPPLIES) ×1 IMPLANT
PADDING CAST COTTON 4X4 STRL (CAST SUPPLIES) ×1
SET CYSTO W/LG BORE CLAMP LF (SET/KITS/TRAYS/PACK) IMPLANT
SET HNDPC FAN SPRY TIP SCT (DISPOSABLE) IMPLANT
SOAP 2 % CHG 4 OZ (WOUND CARE) ×2 IMPLANT
SPONGE T-LAP 18X18 ~~LOC~~+RFID (SPONGE) ×2 IMPLANT
SPONGE T-LAP 4X18 ~~LOC~~+RFID (SPONGE) ×2 IMPLANT
SUT ETHILON 4 0 PS 2 18 (SUTURE) IMPLANT
SUT ETHILON 5 0 P 3 18 (SUTURE)
SUT NYLON ETHILON 5-0 P-3 1X18 (SUTURE) IMPLANT
SWAB COLLECTION DEVICE MRSA (MISCELLANEOUS) ×2 IMPLANT
SWAB CULTURE ESWAB REG 1ML (MISCELLANEOUS) IMPLANT
SYR CONTROL 10ML LL (SYRINGE) IMPLANT
TOWEL GREEN STERILE (TOWEL DISPOSABLE) ×2 IMPLANT
TOWEL GREEN STERILE FF (TOWEL DISPOSABLE) ×2 IMPLANT
TUBE CONNECTING 12X1/4 (SUCTIONS) ×2 IMPLANT
UNDERPAD 30X36 HEAVY ABSORB (UNDERPADS AND DIAPERS) ×2 IMPLANT
WATER STERILE IRR 1000ML POUR (IV SOLUTION) ×2 IMPLANT
YANKAUER SUCT BULB TIP NO VENT (SUCTIONS) ×2 IMPLANT

## 2021-01-06 NOTE — ED Notes (Signed)
I attempted to collect labs and was unsuccessful. 

## 2021-01-06 NOTE — ED Triage Notes (Signed)
Pt presents with c/o right index finger infection/wound check. Pt has had an I&D but the finger is now white, very swollen, and discolored. Pt reports the pain is better than it has been but she is concerned because she is a diabetic.

## 2021-01-06 NOTE — ED Provider Notes (Signed)
[facility] DEPT Provider Note   CSN: [id number] Arrival date & time: 01/06/21  1122     History Chief Complaint  Patient presents with   Wound Check    April Byrd is a 45 y.o. female.  The history is provided by the patient and medical records. No language interpreter was used.  Wound Check   45 year old female who presents for evaluation of a wound recheck.  Patient report she initially developed pain to her right index finger on Thanksgiving of last month.  She was initially evaluated at urgent care on 11/30 and was diagnosed with cellulitis.  She was given doxycycline.  She returns to urgent care center on 12/28/2020 due to continued pain and swelling and at that time an x-ray obtained negative for fracture or dislocation.  She was out for incision and drainage but refused it.  She was started on a trial of clindamycin.  She subsequently returned to the ER on 12/7 due to worsening infection and was diagnosed with a paronychia.  She had an incision and drainage procedure and was prescribed Bactrim.  She returns today with worsening pain swelling and discoloration at the tip of her finger.  Pain is sharp achy throbbing 10 out of 10 persistent without any associated fever or chills no numbness.  She does have history of diabetes.  She is up-to-date with tetanus  Past Medical History:  Diagnosis Date   Asthma    sports induced   BMI 37.0-37.9,adult 04/26/2012   Calculus of gallbladder with acute cholecystitis 04/26/2012   Calculus of gallbladder with acute cholecystitis, without mention of obstruction 04/26/2012   Type II or unspecified type diabetes mellitus without mention of complication, not stated as uncontrolled 04/26/2012   Unspecified asthma(493.90) 04/26/2012   Unspecified essential hypertension 04/26/2012    Patient Active Problem List   Diagnosis Date Noted   Microalbuminuria 06/29/2020   Elevated blood pressure reading in office without diagnosis  of hypertension 01/09/2020   Migraine without aura and with status migrainosus, not intractable 01/09/2020   Diabetes ([place]) 12/11/2018   Vulvovaginal candidiasis 10/10/2018   Abnormal uterine bleeding (AUB) 10/10/2018   Primary osteoarthritis of both feet 02/12/2018   Primary osteoarthritis of both knees 02/12/2018   Primary osteoarthritis of both hands 02/12/2018   Unspecified asthma(493.90) 04/26/2012   BMI 37.0-37.9,adult 04/26/2012    Past Surgical History:  Procedure Laterality Date   CESAREAN SECTION     x 3   CHOLECYSTECTOMY N/A 04/25/2012   Procedure: LAPAROSCOPIC CHOLECYSTECTOMY WITH INTRAOPERATIVE CHOLANGIOGRAM;  Surgeon: Edward Jolly, MD;  Location: [facility];  Service: General;  Laterality: N/A;   hydrocephalus surgery     TUBAL LIGATION       OB History     Gravida  6   Para  3   Term  3   Preterm      AB  3   Living  3      SAB  3   IAB      Ectopic      Multiple      Live Births           Obstetric Comments  c-section x 3.          Family History  Problem Relation Age of Onset   Diabetes Mother    Hypertension Mother    Kidney disease Father    Healthy Brother    Healthy Son    Healthy Son    Healthy Son  Social History   Tobacco Use   Smoking status: Never   Smokeless tobacco: Never  Vaping Use   Vaping Use: Never used  Substance Use Topics   Alcohol use: Yes    Comment: social    Drug use: No    Home Medications Prior to Admission medications   Medication Sig Start Date End Date Taking? Authorizing Provider  Accu-Chek FastClix Lancets MISC USE AS DIRECTED ONCE DAILY TO  TEST  BLOOD  SUGAR DXE11.65 07/05/19   Fulp, Cammie, MD  bacitracin ointment Apply 1 application topically 3 (three) times daily. Apply to nail bed 12/30/20   Mickie Hillier, PA-C  Blood Glucose Monitoring Suppl (ACCU-CHEK AVIVA PLUS) w/Device KIT 1 each by Does not apply route 3 (three) times daily. Use to check blood sugars 3 times daily.  XVQ00.86 07/08/19   Fulp, Ander Gaster, MD  Continuous Blood Gluc Receiver (FREESTYLE LIBRE READER) DEVI 1 Device by Does not apply route daily. 06/29/20   Nicolette Bang, MD  Continuous Blood Gluc Sensor (FREESTYLE LIBRE 2 SENSOR) MISC USE AS DIRECTED 12/10/20   Camillia Herter, NP  doxycycline (VIBRAMYCIN) 100 MG capsule Take 1 capsule (100 mg total) by mouth 2 (two) times daily. 12/23/20   Francene Finders, PA-C  gabapentin (NEURONTIN) 300 MG capsule Take 1 capsule (300 mg total) by mouth at bedtime. 11/13/20   Camillia Herter, NP  glimepiride (AMARYL) 4 MG tablet Take 2 tablets (8 mg total) by mouth daily before breakfast. 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 11/13/20   Camillia Herter, NP  Insulin Pen Needle (PEN NEEDLES) 31G X 8 MM MISC UAD 11/13/20   Camillia Herter, NP  losartan (COZAAR) 50 MG tablet Take 1 tablet (50 mg total) by mouth daily. 12/10/20   Camillia Herter, NP  metFORMIN (GLUCOPHAGE) 500 MG tablet Take 2 tablets (1,000 mg total) by mouth 2 (two) times daily with a meal. 11/13/20 02/11/21  Camillia Herter, NP  phentermine 15 MG capsule Take 1 capsule (15 mg total) by mouth every morning. 08/12/20 09/11/20  Camillia Herter, NP  sulfamethoxazole-trimethoprim (BACTRIM DS) 800-160 MG tablet Take 1 tablet by mouth 2 (two) times daily for 7 days. 12/30/20 01/06/21  Mickie Hillier, PA-C    Allergies    Penicillins  Review of Systems   Review of Systems  All other systems reviewed and are negative.  Physical Exam Updated Vital Signs BP (!) 146/106 (BP Location: Left Arm)    Pulse (!) 113    Temp 97.9 F (36.6 C) (Oral)    Resp 18    LMP 12/18/2020    SpO2 100%   Physical Exam Vitals and nursing note reviewed.  Constitutional:      General: She  is not in acute distress.    Appearance: She is well-developed.  HENT:     Head: Atraumatic.  Eyes:     Conjunctiva/sclera: Conjunctivae normal.  Cardiovascular:     Rate and Rhythm: Tachycardia present.  Pulmonary:     Effort: Pulmonary effort is normal.  Musculoskeletal:        General: Swelling (Right index finger: Tip of finger is moderately edematous and discolored with yellowish discharge and presence of purulent infection.  No nail involvement.  Please refer to pictures below) present.     Cervical back: Neck supple.  Skin:    Findings: No rash.  Neurological:     Mental Status: She is alert.  Psychiatric:        Mood and Affect: Mood normal.         ED Results / Procedures / Treatments   Labs (all labs ordered are listed, but only abnormal results are displayed) Labs Reviewed  RESP PANEL BY RT-PCR (FLU A&B, COVID) ARPGX2  LACTIC ACID, PLASMA  LACTIC ACID, PLASMA  COMPREHENSIVE METABOLIC PANEL  CBC WITH DIFFERENTIAL/PLATELET  I-STAT BETA HCG BLOOD, ED ([facility], [facility], [facility] ONLY)    EKG None  Radiology DG Finger Index Right  Result Date: 01/06/2021 CLINICAL DATA:  Distal right index finger infection. EXAM: RIGHT INDEX FINGER 2+V COMPARISON:  Right index finger x-rays dated December 28, 2020. FINDINGS: New soft tissue irregularity and small focus of subcutaneous air proximal to the nail bed on the dorsal distal index finger. No underlying bony destruction or periosteal reaction. No acute fracture or dislocation. Joint spaces are preserved. Bone mineralization is normal. IMPRESSION: 1. Soft tissue infection of the distal index finger without radiographic evidence of osteomyelitis. Electronically Signed   By: Titus Dubin M.D.   On: 01/06/2021 12:27    Procedures Procedures   Medications Ordered in ED Medications - No data to display  ED Course  I have reviewed the triage vital signs and the nursing notes.  Pertinent labs & imaging results that were available during  my care of the patient were reviewed by me and considered in my medical decision making (see chart for details).    MDM Rules/Calculators/A&P                           BP (!) 146/106 (BP Location: Left Arm)    Pulse (!) 113    Temp 97.9 F (36.6 C) (Oral)    Resp 18    LMP 12/18/2020    SpO2 100%   Final Clinical Impression(s) / ED Diagnoses Final diagnoses:  None    Rx / DC Orders ED Discharge Orders     None      Patient has a paronychia involving her right index finger that was incised and drained 7 days prior.  She is here with worsening infection.  She has been seen and evaluated multiple times for this infection with progressive worsening presentation.  Today failure appears to be grossly infected.  Appreciate consultation from hand surgery PA Hilbert Odor who is seen evaluate patient.  Plan to have patient admitted to [facility] for surgical intervention.  We will request medicine for admission.  Hold off on antibiotic as per request.  Hand specialist will be Dr. Caralyn Guile.   3:28 PM Pt sign out to oncoming team who will consult medicine for admission once labs resulted.  Pt to be transferred to [facility] for further management.    Domenic Moras, PA-C 01/06/21 1532    Pattricia Boss, MD 01/07/21 [phone]

## 2021-01-06 NOTE — ED Provider Notes (Signed)
Emergency Medicine Provider Triage Evaluation Note  April Byrd , a 45 y.o. female  was evaluated in triage.  Pt complains of finger infection.  Review of Systems  Positive: R index finger infection, pain Negative: Fever, chills, numbness  Physical Exam  BP (!) 146/106 (BP Location: Left Arm)    Pulse (!) 113    Temp 97.9 F (36.6 C) (Oral)    Resp 18    LMP 12/18/2020    SpO2 100%  Gen:   Awake, no distress   Resp:  Normal effort  MSK:   Moves extremities without difficulty  Other:  Please refer to pictures        Medical Decision Making  Medically screening exam initiated at 12:03 PM.  Appropriate orders placed.  Eisley Barber was informed that the remainder of the evaluation will be completed by another provider, this initial triage assessment does not replace that evaluation, and the importance of remaining in the ED until their evaluation is complete.  Pt was diagnosed with paronychia a week ago, was on clinda, finger got worse, had I&D several days ago and was placed on bactrim, now with worsening of infection.  Hx of DM, is UTD with tetanus.    Care discussed with orthopedic PA Earney Hamburg.  Plan to have pt admitted with IV abx and hand involvement.    Fayrene Helper, PA-C 01/06/21 1209    Margarita Grizzle, MD 01/07/21 [phone]

## 2021-01-06 NOTE — Consult Note (Addendum)
Reason for Consult:Right index finger infection Referring Physician: Pattricia Boss Time called: 1206 Time at bedside: [name]   April Byrd is an 45 y.o. female.  HPI: April Byrd has been dealing with a right index finger infection since late November. She denies any known trauma. She went to UC and was placed on doxy. She returned a few days later and underwent I&D and Septra was added to regimen. She has finished both of those but the finger is worse if anything. She denies fevers, chills, sweats, N/V, or prior hx/o. 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  Past Medical History:  Diagnosis Date   Asthma    sports induced   BMI 37.0-37.9,adult 04/26/2012   Calculus of gallbladder with acute cholecystitis 04/26/2012   Calculus of gallbladder with acute cholecystitis, without mention of obstruction 04/26/2012   Type II or unspecified type diabetes mellitus without mention of complication, not stated as uncontrolled 04/26/2012   Unspecified asthma(493.90) 04/26/2012   Unspecified essential hypertension 04/26/2012    Past Surgical History:  Procedure Laterality Date   CESAREAN SECTION     x 3   CHOLECYSTECTOMY N/A 04/25/2012   Procedure: LAPAROSCOPIC CHOLECYSTECTOMY WITH INTRAOPERATIVE CHOLANGIOGRAM;  Surgeon: Edward Jolly, MD;  Location: [facility];  Service: General;  Laterality: N/A;   hydrocephalus surgery     TUBAL LIGATION      Family History  Problem Relation Age of Onset   Diabetes Mother    Hypertension Mother    Kidney disease Father    Healthy Brother    Healthy Son    Healthy Son    Healthy Son     Social History:  reports that she has never smoked. She has never used smokeless tobacco. She reports current alcohol use. She reports that she does not use drugs.  Allergies:  Allergies  Allergen Reactions   Penicillins Other (See Comments)    Has patient had a PCN reaction causing immediate rash, facial/tongue/throat swelling,  SOB or lightheadedness with hypotension: Unknown Has patient had a PCN reaction causing severe rash involving mucus membranes or skin necrosis: Unknown Has patient had a PCN reaction that required hospitalization: Unknown Has patient had a PCN reaction occurring within the last 10 years: No If all of the above answers are "NO", then may proceed with Cephalosporin use.  Has patient had a PCN reaction causing immediate rash, facial/tongue/throat swelling, SOB or lightheadedness with hypotension: Unknown Has patient had a PCN reaction causing severe rash involving mucus membranes or skin necrosis: Unknown Has patient had a PCN reaction that required hospitalization: Unknown Has patient had a PCN reaction occurring within the last 10 years: No If all of the above answers are "NO", then may proceed with Cephalosporin use.    Medications: I have reviewed the patient's current medications.  Results for orders placed or performed during the hospital encounter of 01/06/21 (from the past 48 hour(s))  Resp Panel by RT-PCR (Flu A&B, Covid) Nasopharyngeal Swab     Status: None   Collection Time: 01/06/21 12:21 PM   Specimen: Nasopharyngeal Swab; Nasopharyngeal(NP) swabs in vial transport medium  Result Value Ref Range   SARS Coronavirus 2 by RT PCR NEGATIVE NEGATIVE    Comment: (NOTE) SARS-CoV-2 target nucleic acids are NOT DETECTED.  The SARS-CoV-2 RNA is generally detectable in upper respiratory specimens during the acute phase of infection. The lowest concentration of SARS-CoV-2 viral copies this assay can detect is 138 copies/mL. A negative result  does not preclude SARS-Cov-2 infection and should not be used as the sole basis for treatment or other patient management decisions. A negative result may occur with  improper specimen collection/handling, submission of specimen other than nasopharyngeal swab, presence of viral mutation(s) within the areas targeted by this assay, and inadequate  number of viral copies(<138 copies/mL). A negative result must be combined with clinical observations, patient history, and epidemiological information. The expected result is Negative.  Fact Sheet for Patients:  EntrepreneurPulse.com.au  Fact Sheet for Healthcare Providers:  IncredibleEmployment.be  This test is no t yet approved or cleared by the [place] FDA and  has been authorized for detection and/or diagnosis of SARS-CoV-2 by FDA under an Emergency Use Authorization (EUA). This EUA will remain  in effect (meaning this test can be used) for the duration of the COVID-19 declaration under Section 564(b)(1) of the Act, 21 U.S.C.section 360bbb-3(b)(1), unless the authorization is terminated  or revoked sooner.       Influenza A by PCR NEGATIVE NEGATIVE   Influenza B by PCR NEGATIVE NEGATIVE    Comment: (NOTE) The Xpert Xpress SARS-CoV-2/FLU/RSV plus assay is intended as an aid in the diagnosis of influenza from Nasopharyngeal swab specimens and should not be used as a sole basis for treatment. Nasal washings and aspirates are unacceptable for Xpert Xpress SARS-CoV-2/FLU/RSV testing.  Fact Sheet for Patients: EntrepreneurPulse.com.au  Fact Sheet for Healthcare Providers: IncredibleEmployment.be  This test is not yet approved or cleared by the [place] FDA and has been authorized for detection and/or diagnosis of SARS-CoV-2 by FDA under an Emergency Use Authorization (EUA). This EUA will remain in effect (meaning this test can be used) for the duration of the COVID-19 declaration under Section 564(b)(1) of the Act, 21 U.S.C. section 360bbb-3(b)(1), unless the authorization is terminated or revoked.  Performed at [facility], [place] [address] [place], [place] [address]     DG Finger Index Right  Result Date: 01/06/2021 CLINICAL DATA:  Distal right index finger  infection. EXAM: RIGHT INDEX FINGER 2+V COMPARISON:  Right index finger x-rays dated December 28, 2020. FINDINGS: New soft tissue irregularity and small focus of subcutaneous air proximal to the nail bed on the dorsal distal index finger. No underlying bony destruction or periosteal reaction. No acute fracture or dislocation. Joint spaces are preserved. Bone mineralization is normal. IMPRESSION: 1. Soft tissue infection of the distal index finger without radiographic evidence of osteomyelitis. Electronically Signed   By: Titus Dubin M.D.   On: 01/06/2021 12:27    Review of Systems  Constitutional:  Negative for chills, diaphoresis and fever.  HENT:  Negative for ear discharge, ear pain, hearing loss and tinnitus.   Eyes:  Negative for photophobia and pain.  Respiratory:  Negative for cough and shortness of breath.   Cardiovascular:  Negative for chest pain.  Gastrointestinal:  Negative for abdominal pain, nausea and vomiting.  Genitourinary:  Negative for dysuria, flank pain, frequency and urgency.  Musculoskeletal:  Positive for arthralgias (Right index finger). Negative for back pain, myalgias and neck pain.  Neurological:  Negative for dizziness and headaches.  Hematological:  Does not bruise/bleed easily.  Psychiatric/Behavioral:  The patient is not nervous/anxious.   Blood pressure (!) 146/106, pulse (!) 113, temperature 97.9 F (36.6 C), temperature source Oral, resp. rate 18, last menstrual period 12/18/2020, SpO2 100 %. Physical Exam Constitutional:      General: She is not in acute distress.    Appearance: She is well-developed. She is not  diaphoretic.  HENT:     Head: Normocephalic and atraumatic.  Eyes:     General: No scleral icterus.       Right eye: No discharge.        Left eye: No discharge.     Conjunctiva/sclera: Conjunctivae normal.  Cardiovascular:     Rate and Rhythm: Normal rate and regular rhythm.  Pulmonary:     Effort: Pulmonary effort is normal. No  respiratory distress.  Musculoskeletal:     Cervical back: Normal range of motion.     Comments: Right shoulder, elbow, wrist, digits- no skin wounds, fusiform edema with SQ purulence, mod TTP, no instability, no blocks to motion  Sens  Ax/R/M/U intact  Mot   Ax/ R/ PIN/ M/ AIN/ U intact  Rad 2+  Skin:    General: Skin is warm and dry.  Neurological:     Mental Status: She is alert.  Psychiatric:        Mood and Affect: Mood normal.        Behavior: Behavior normal.    Assessment/Plan: Right index finger infection -- Given lack of response to appropriate abx and previous I&D believe she needs operative intervention and IV abx. I'm sure her poorly controlled diabetes is to blame. Please keep NPO and transfer to [facility] for I&D this evening by Dr. Melvyn Novas.   Patient was seen and evaluated today.  The patient does have the infection of the fingertip I do have concern about the viability of the fingertip but I do think it is very worthwhile proceeding with formal incision and drainage the patient may require repeat I&D.  Continue to watch her closely. Dodger Sinning Melvyn Novas MD  Freeman Caldron, PA-C Orthopedic Surgery [phone] 01/06/2021, 1:31 PM

## 2021-01-06 NOTE — Discharge Instructions (Signed)
KEEP BANDAGE CLEAN AND DRY CALL OFFICE FOR F/U APPT [phone] in 2 days KEEP HAND ELEVATED ABOVE HEART OK TO APPLY ICE TO OPERATIVE AREA CONTACT OFFICE IF ANY WORSENING PAIN OR CONCERNS.  

## 2021-01-06 NOTE — ED Triage Notes (Signed)
Follow up finger infection. Was in ED one week prior and had I&D, has been maintaining wound care. Finished antibiotic prescription today. On inspection, distal tip of finger is white, nailbed pale/blue. Surrounding skin red. Swelling/redness at base of finger has improved per patient. States the pain in her finger began easing off 2 days ago as well.

## 2021-01-06 NOTE — Anesthesia Preprocedure Evaluation (Addendum)
Anesthesia Evaluation  Patient identified by MRN, date of birth, ID band Patient awake    Reviewed: Allergy & Precautions, NPO status , Patient's Chart, lab work & pertinent test results  Airway Mallampati: I  TM Distance: >3 FB Neck ROM: Full    Dental  (+) Dental Advisory Given, Teeth Intact   Pulmonary asthma ,    breath sounds clear to auscultation       Cardiovascular hypertension, Pt. on medications  Rhythm:Regular     Neuro/Psych  Headaches, negative psych ROS   GI/Hepatic negative GI ROS, Neg liver ROS,   Endo/Other  diabetes, Type 2, Insulin Dependent, Oral Hypoglycemic Agents  Renal/GU negative Renal ROS     Musculoskeletal  (+) Arthritis , Osteoarthritis,  Right finger paronychia    Abdominal   Peds  Hematology negative hematology ROS (+)   Anesthesia Other Findings   Reproductive/Obstetrics                            Anesthesia Physical Anesthesia Plan  ASA: 2  Anesthesia Plan: General   Post-op Pain Management: Regional block   Induction: Intravenous  PONV Risk Score and Plan: 3 and Ondansetron, Midazolam and Dexamethasone  Airway Management Planned: LMA and Oral ETT  Additional Equipment: None  Intra-op Plan:   Post-operative Plan: Extubation in OR  Informed Consent: I have reviewed the patients History and Physical, chart, labs and discussed the procedure including the risks, benefits and alternatives for the proposed anesthesia with the patient or authorized representative who has indicated his/her understanding and acceptance.     Dental advisory given  Plan Discussed with: CRNA and Anesthesiologist  Anesthesia Plan Comments: (Lab Results      Component                Value               Date                      WBC                      4.4                 01/08/2020                HGB                      14.3                01/06/2021                 HCT                      42.0                01/06/2021                MCV                      82                  01/08/2020                PLT                      250  01/08/2020           Lab Results      Component                Value               Date                      NA                       132 (L)             01/06/2021                K                        4.5                 01/06/2021                CO2                      19 (L)              01/06/2021                GLUCOSE                  172 (H)             01/06/2021                BUN                      14                  01/06/2021                CREATININE               0.96                01/06/2021                CALCIUM                  7.9 (L)             01/06/2021                GFRNONAA                 >60                 01/06/2021          )       Anesthesia Quick Evaluation

## 2021-01-06 NOTE — Transfer of Care (Signed)
Immediate Anesthesia Transfer of Care Note  Patient: April Byrd  Procedure(s) Performed: IRRIGATION AND DEBRIDEMENT OF INDEX FINGER (Right)  Patient Location: PACU  Anesthesia Type:General  Level of Consciousness: awake, alert  and oriented  Airway & Oxygen Therapy: Patient Spontanous Breathing and Patient connected to nasal cannula oxygen  Post-op Assessment: Report given to RN, Post -op Vital signs reviewed and stable and Patient moving all extremities  Post vital signs: Reviewed and stable  Last Vitals:  Vitals Value Taken Time  BP 163/93 01/06/21 2005  Temp 36.4 C 01/06/21 2003  Pulse 90 01/06/21 2008  Resp 15 01/06/21 2008  SpO2 100 % 01/06/21 2008  Vitals shown include unvalidated device data.  Last Pain:  Vitals:   01/06/21 2003  TempSrc:   PainSc: 0-No pain         Complications: No notable events documented.

## 2021-01-06 NOTE — Anesthesia Procedure Notes (Signed)
Procedure Name: Intubation Date/Time: 01/06/2021 7:23 PM Performed by: Lissete Maestas T, CRNA Pre-anesthesia Checklist: Patient identified, Emergency Drugs available, Suction available and Patient being monitored Patient Re-evaluated:Patient Re-evaluated prior to induction Oxygen Delivery Method: Circle system utilized Preoxygenation: Pre-oxygenation with 100% oxygen Induction Type: IV induction and Rapid sequence Ventilation: Mask ventilation without difficulty Laryngoscope Size: 2 and Bialecki Grade View: Grade I Tube type: Oral Tube size: 7.0 mm Number of attempts: 1 Airway Equipment and Method: Stylet and Oral airway Placement Confirmation: ETT inserted through vocal cords under direct vision, positive ETCO2 and breath sounds checked- equal and bilateral Secured at: 21 cm Tube secured with: Tape Dental Injury: Teeth and Oropharynx as per pre-operative assessment

## 2021-01-06 NOTE — Op Note (Signed)
PREOPERATIVE DIAGNOSIS: Right index finger infection chronic  POSTOPERATIVE DIAGNOSIS: Same  ATTENDING SURGEON: Dr. Bradly Bienenstock who scrubbed and present for the entire procedure  ASSISTANT SURGEON: None  ANESTHESIA: General via LMA  OPERATIVE PROCEDURE: Debridement of skin subcutaneous tissue and bone right index finger distal tip infection Right index finger incision and drainage complicated abscess.  IMPLANTS: None  EBL: Minimal  RADIOGRAPHIC INTERPRETATION: None  SURGICAL INDICATIONS: Patient is a right-hand-dominant diabetic with a persistent infection to the right index finger.  Patient was seen and evaluated in the hospital recommended undergo the above procedure.  Risks of surgery include but not limited to bleeding infection need for further surgical intervention and need for potential removal of the end of the finger for chronic infection.  Patient signed informed consent to proceed with the procedure.  SURGICAL TECHNIQUE: The patient was palpated via the preoperative holding area marked apart a marker made on the right index finger and indicate correct operative site.  Patient brought back to operating placed supine on the anesthesia table where the general anesthetic was administered.  Patient tolerates well.  Preoperative antibiotics were given prior to skin incision.  A well-padded tourniquet placed on the right forearm and seal with the appropriate drape.  Right upper extremities then prepped and draped normal sterile fashion.  A timeout was called the correct site identified procedure then begun.   Debridement type: Excisional Debridement  Side: right  Body Location: Right index finger   Tools used for debridement: scalpel, curette, and rongeur  Pre-debridement Wound size (cm):   Length: 2        Width: 2     Depth: 1   Post-debridement Wound size (cm):   Length: 2        Width: 2     Depth: 1   Debridement depth beyond dead/damaged tissue down to healthy viable  tissue: yes  Tissue layer involved: skin, subcutaneous tissue, muscle / fascia, bone  Nature of tissue removed: Devitalized Tissue and Purulence  Irrigation volume: 500cc     Irrigation fluid type: Normal Saline   Patient tolerated debridement.  Following excisional debridement the wound was then thoroughly irrigated drainage of the pulp space was then carried out.  Incision and drainage was then done of the region along the volar margin of the index finger.  After thorough debridement of the devitalized tissue and drainage the wound was left open.  Adaptic dressing sterile compressive bandage then applied.  The patient tolerated the procedure well.  There is good perfusion the fingertip.  Patient tolerated the procedure well  POSTOPERATIVE PLAN: Patient be discharged to home.  See him back in the office in 2 days for wound check we will take pictures and placed them in the chart so we can follow her we will need to follow her in the office with therapy should be sent down to see our therapist for whirlpool and wound care will be followed closely next week.  Continue with the oral antibiotic regimen.

## 2021-01-06 NOTE — ED Provider Notes (Signed)
Patient here today for further evaluation of finger infection.  Symptoms seem to be worsening with time despite multiple antibiotics as well as incision and drainage a week ago.  She now has significant swelling to her distal right index finger with significant discoloration.  Recommended she have further evaluation in the emergency department.  Patient is agreeable to same.   Tomi Bamberger, PA-C 01/06/21 [phone]

## 2021-01-06 NOTE — ED Provider Notes (Signed)
Care assumed from PA Fayrene Helper at shift change, please see his note for full detail but in brief April Byrd is a 45 y.o. female presents with worsening infection to her right index finger.  She initially developed pain and infection to the finger around Thanksgiving, was seen at urgent care and diagnosed with cellulitis and treated with Doxy.  Came back with worsening symptoms on 12/5 offered I&D but declined at that time and was started on clindamycin.  Return to the ED with worsening infection, and diagnosed with paronychia, I&D performed and placed on Bactrim.  Here today with worsening pain, swelling and discoloration to the fingertip despite intervention and multiple courses of antibiotics.  Labs pending at shift change.  PA Dale Belmont with hand has been consulted and recommends medicine admission to [facility].  Dr. Orlan Leavens with hand surgery will plan for surgical intervention.  Recommends holding off on antibiotics at this time.  Plan: Once labs have returned we will call for admission.  Physical Exam  BP (!) 146/106 (BP Location: Left Arm)    Pulse (!) 113    Temp 97.9 F (36.6 C) (Oral)    Resp 18    LMP 12/18/2020    SpO2 100%   Right index finger as pictured below    ED Course/Procedures   Labs Reviewed  RESP PANEL BY RT-PCR (FLU A&B, COVID) [id number]  LACTIC ACID, PLASMA  LACTIC ACID, PLASMA  COMPREHENSIVE METABOLIC PANEL  CBC WITH DIFFERENTIAL/PLATELET  I-STAT BETA HCG BLOOD, ED ([facility], [facility], [facility] ONLY)   DG Finger Index Right  Result Date: 01/06/2021 CLINICAL DATA:  Distal right index finger infection. EXAM: RIGHT INDEX FINGER 2+V COMPARISON:  Right index finger x-rays dated December 28, 2020. FINDINGS: New soft tissue irregularity and small focus of subcutaneous air proximal to the nail bed on the dorsal distal index finger. No underlying bony destruction or periosteal reaction. No acute fracture or dislocation. Joint spaces are preserved. Bone mineralization is normal.  IMPRESSION: 1. Soft tissue infection of the distal index finger without radiographic evidence of osteomyelitis. Electronically Signed   By: Obie Dredge M.D.   On: 01/06/2021 12:27     Procedures  MDM   Contacted hand surgery team patient needs to be sent over to [facility] as she is posted for surgery this evening.  Patient was still in the waiting room at this time and then not had her lab work drawn for medicine consults.  Patient called back to room and labs drawn.  Hand surgery team has requested that patient be transferred to [facility], ASAP for preop.  Patient will drive to go by private vehicle.   I spoke with Dr. Benjamine Mola with [facility] hospitalist, because they are not able to see patient prior to her leaving to go to [facility] for surgery the medicine team at [facility] will need to be called once patient is out of surgery. I have communicated this with hand surgery team.   Pt driven by family member directly to [facility] has been instructed to go to admissions department.     Dartha Lodge, PA-C 01/06/21 1712    Linwood Dibbles, MD 01/10/21 [phone]

## 2021-01-07 ENCOUNTER — Encounter (HOSPITAL_COMMUNITY): Payer: Self-pay | Admitting: Orthopedic Surgery

## 2021-01-08 ENCOUNTER — Encounter (HOSPITAL_COMMUNITY): Payer: Self-pay | Admitting: Orthopedic Surgery

## 2021-01-08 DIAGNOSIS — M79641 Pain in right hand: Secondary | ICD-10-CM | POA: Diagnosis not present

## 2021-01-08 NOTE — Anesthesia Postprocedure Evaluation (Signed)
Anesthesia Post Note  Patient: April Byrd  Procedure(s) Performed: IRRIGATION AND DEBRIDEMENT OF INDEX FINGER (Right)     Patient location during evaluation: PACU Anesthesia Type: General Level of consciousness: awake and alert Pain management: pain level controlled Vital Signs Assessment: post-procedure vital signs reviewed and stable Respiratory status: spontaneous breathing, nonlabored ventilation, respiratory function stable and patient connected to nasal cannula oxygen Cardiovascular status: blood pressure returned to baseline and stable Postop Assessment: no apparent nausea or vomiting Anesthetic complications: no   No notable events documented.  Last Vitals:  Vitals:   01/06/21 2017 01/06/21 2032  BP: (!) 147/96 (!) 147/96  Pulse: 87 90  Resp: 10   Temp:  37 C  SpO2: 98% 100%    Last Pain:  Vitals:   01/06/21 2032  TempSrc:   PainSc: 4                  Aleksis Jiggetts

## 2021-01-11 DIAGNOSIS — M79641 Pain in right hand: Secondary | ICD-10-CM | POA: Diagnosis not present

## 2021-01-12 DIAGNOSIS — M79641 Pain in right hand: Secondary | ICD-10-CM | POA: Diagnosis not present

## 2021-01-13 DIAGNOSIS — M79641 Pain in right hand: Secondary | ICD-10-CM | POA: Diagnosis not present

## 2021-01-14 DIAGNOSIS — M79641 Pain in right hand: Secondary | ICD-10-CM | POA: Diagnosis not present

## 2021-01-15 DIAGNOSIS — M79641 Pain in right hand: Secondary | ICD-10-CM | POA: Diagnosis not present

## 2021-01-19 DIAGNOSIS — M79641 Pain in right hand: Secondary | ICD-10-CM | POA: Diagnosis not present

## 2021-01-22 DIAGNOSIS — M79641 Pain in right hand: Secondary | ICD-10-CM | POA: Diagnosis not present

## 2021-01-26 DIAGNOSIS — M79641 Pain in right hand: Secondary | ICD-10-CM | POA: Diagnosis not present

## 2021-01-28 DIAGNOSIS — M79641 Pain in right hand: Secondary | ICD-10-CM | POA: Diagnosis not present

## 2021-01-29 ENCOUNTER — Ambulatory Visit: Payer: Medicaid Other | Admitting: Family

## 2021-02-05 DIAGNOSIS — M79641 Pain in right hand: Secondary | ICD-10-CM | POA: Diagnosis not present

## 2021-02-11 NOTE — Progress Notes (Signed)
Patient ID: April Byrd, female    DOB: 1975-03-23  MRN: [id number]  CC: Diabetes Follow-Up   Subjective: April Byrd is a 46 y.o. female who presents for diabetes follow-up.  Her concerns today include:  DIABETES TYPE 2 FOLLOW-UP: 12/10/2020: - Continue Metformin and Insulin Glargine as prescribed.  - Begin Glimepiride as prescribed.   02/16/2021: Reports since last appointment had surgery of finger on right hand related to cellulitis. Next appointment for the same with Iran Planas, MD February 2023 . Has not been taking medications as she normally would reporting a lot has been going on. Reports has to pay for continuous glucose monitor out of pocket and plans to purchase today.   2. DECREASED LIBIDO: Reports decreased libido and other symptoms such as vaginal dryness. Would like to try medication to assist.   Patient Active Problem List   Diagnosis Date Noted   Microalbuminuria 06/29/2020   Elevated blood-pressure reading without diagnosis of hypertension 01/09/2020   Migraine without aura, not refractory 01/09/2020   Diabetes mellitus ([place]) 12/11/2018   Candidal vulvovaginitis 10/10/2018   Abnormal uterine bleeding 10/10/2018   Osteoarthritis of feet, bilateral 02/12/2018   Primary osteoarthritis of both knees 02/12/2018   Osteoarthritis of both hands 02/12/2018   Unspecified asthma(493.90) 04/26/2012   Obesity with body mass index 30 or greater 04/26/2012     Current Outpatient Medications on File Prior to Visit  Medication Sig Dispense Refill   Accu-Chek FastClix Lancets MISC USE AS DIRECTED ONCE DAILY TO  TEST  BLOOD  SUGAR DXE11.65 102 each 0   bacitracin ointment Apply 1 application topically 3 (three) times daily. Apply to nail bed 120 g 0   Blood Glucose Monitoring Suppl (ACCU-CHEK AVIVA PLUS) w/Device KIT 1 each by Does not apply route 3 (three) times daily. Use to check blood sugars 3 times daily. DXE11.65 1 kit 0   Continuous Blood Gluc Receiver  (FREESTYLE LIBRE READER) DEVI 1 Device by Does not apply route daily. 1 each 0   Continuous Blood Gluc Sensor (FREESTYLE LIBRE 2 SENSOR) MISC USE AS DIRECTED 16 each 0   glucose blood (ACCU-CHEK GUIDE) test strip Test 3 times daily DxE11.65 100 each 2   No current facility-administered medications on file prior to visit.    Allergies  Allergen Reactions   Penicillins Other (See Comments)    Has patient had a PCN reaction causing immediate rash, facial/tongue/throat swelling, SOB or lightheadedness with hypotension: Unknown Has patient had a PCN reaction causing severe rash involving mucus membranes or skin necrosis: Unknown Has patient had a PCN reaction that required hospitalization: Unknown Has patient had a PCN reaction occurring within the last 10 years: No If all of the above answers are "NO", then may proceed with Cephalosporin use.  Has patient had a PCN reaction causing immediate rash, facial/tongue/throat swelling, SOB or lightheadedness with hypotension: Unknown Has patient had a PCN reaction causing severe rash involving mucus membranes or skin necrosis: Unknown Has patient had a PCN reaction that required hospitalization: Unknown Has patient had a PCN reaction occurring within the last 10 years: No If all of the above answers are "NO", then may proceed with Cephalosporin use.    Social History   Socioeconomic History   Marital status: Single    Spouse name: Not on file   Number of children: Not on file   Years of education: Not on file   Highest education level: Not on file  Occupational History   Not on file  Tobacco Use   Smoking status: Never   Smokeless tobacco: Never  Vaping Use   Vaping Use: Never used  Substance and Sexual Activity   Alcohol use: Yes    Comment: social    Drug use: No   Sexual activity: Yes    Birth control/protection: Condom, Surgical  Other Topics Concern   Not on file  Social History Narrative   Not on file   Social Determinants  of Health   Financial Resource Strain: Not on file  Food Insecurity: Not on file  Transportation Needs: Not on file  Physical Activity: Not on file  Stress: Not on file  Social Connections: Not on file  Intimate Partner Violence: Not on file    Family History  Problem Relation Age of Onset   Diabetes Mother    Hypertension Mother    Kidney disease Father    Healthy Brother    Healthy Son    Healthy Son    Healthy Son     Past Surgical History:  Procedure Laterality Date   CESAREAN SECTION     x 3   CHOLECYSTECTOMY N/A 04/25/2012   Procedure: LAPAROSCOPIC CHOLECYSTECTOMY WITH INTRAOPERATIVE CHOLANGIOGRAM;  Surgeon: Edward Jolly, MD;  Location: [facility];  Service: General;  Laterality: N/A;   hydrocephalus surgery     I & D EXTREMITY Right 01/06/2021   Procedure: IRRIGATION AND DEBRIDEMENT OF INDEX FINGER;  Surgeon: Iran Planas, MD;  Location: [facility];  Service: Orthopedics;  Laterality: Right;   TUBAL LIGATION      ROS: Review of Systems Negative except as stated above  PHYSICAL EXAM: BP 125/86 (BP Location: Left Arm, Patient Position: Sitting, Cuff Size: Large)    Pulse 79    Temp 98.3 F (36.8 C)    Resp 18    Ht 5' 4.02" (1.626 m)    Wt 218 lb (98.9 kg)    SpO2 100%    BMI 37.40 kg/m   Physical Exam HENT:     Head: Normocephalic and atraumatic.  Eyes:     Extraocular Movements: Extraocular movements intact.     Conjunctiva/sclera: Conjunctivae normal.     Pupils: Pupils are equal, round, and reactive to light.  Cardiovascular:     Rate and Rhythm: Normal rate and regular rhythm.     Pulses: Normal pulses.     Heart sounds: Normal heart sounds.  Pulmonary:     Effort: Pulmonary effort is normal.     Breath sounds: Normal breath sounds.  Musculoskeletal:     Cervical back: Normal range of motion and neck supple.  Neurological:     General: No focal deficit present.     Mental Status: She is alert and oriented to person, place, and time.  Psychiatric:         Mood and Affect: Mood normal.        Behavior: Behavior normal.    Results for orders placed or performed in visit on 02/16/21  POCT glycosylated hemoglobin (Hb A1C)  Result Value Ref Range   Hemoglobin A1C 9.8 (A) 4.0 - 5.6 %   HbA1c POC (<> result, manual entry)     HbA1c, POC (prediabetic range)     HbA1c, POC (controlled diabetic range)      ASSESSMENT AND PLAN: 1. Type 2 diabetes mellitus with diabetic mononeuropathy, with long-term current use of insulin ([place]): - Hemoglobin A1c today not at goal at 9.8%, goal < 7%.  - Continue Metformin, Glimepiride, and Insulin Glargine as prescribed.  -  Discussed the importance of healthy eating habits, low-carbohydrate diet, low-sugar diet, regular aerobic exercise (at least 150 minutes a week as tolerated) and medication compliance to achieve or maintain control of diabetes. - Update BMP.  - Referral to Endocrinology for further evaluation and management.  - POCT glycosylated hemoglobin (Hb A1C) - Ambulatory referral to Endocrinology - Basic Metabolic Panel - glimepiride (AMARYL) 4 MG tablet; Take 2 tablets (8 mg total) by mouth daily before breakfast. Must eat before taking medication  Dispense: 180 tablet; Refill: 0 - metFORMIN (GLUCOPHAGE) 500 MG tablet; Take 2 tablets (1,000 mg total) by mouth 2 (two) times daily with a meal.  Dispense: 360 tablet; Refill: 0 - Insulin Pen Needle (PEN NEEDLES) 31G X 8 MM MISC; UAD  Dispense: 100 each; Refill: 0 - insulin glargine (LANTUS SOLOSTAR) 100 UNIT/ML Solostar Pen; 24 units subcut daily at bedtime.  Dispense: 15 mL; Refill: 2  2. Microalbuminuria: - Continue Losartan as prescribed.  - Follow-up with primary provider as scheduled.  - losartan (COZAAR) 50 MG tablet; Take 1 tablet (50 mg total) by mouth daily.  Dispense: 90 tablet; Refill: 0  3. Diabetic peripheral neuropathy ([place]): - Continue Gabapentin as prescribed.  - Follow-up with primary provider as scheduled.  - gabapentin  (NEURONTIN) 300 MG capsule; Take 1 capsule (300 mg total) by mouth at bedtime.  Dispense: 90 capsule; Refill: 0  4. Decreased libido: - Referral to Gynecology for further evaluation and management.  - Ambulatory referral to Gynecology   Patient was given the opportunity to ask questions.  Patient verbalized understanding of the plan and was able to repeat key elements of the plan. Patient was given clear instructions to go to Emergency Department or return to medical center if symptoms don't improve, worsen, or new problems develop.The patient verbalized understanding.   Orders Placed This Encounter  Procedures   Basic Metabolic Panel   Ambulatory referral to Endocrinology   Ambulatory referral to Gynecology   POCT glycosylated hemoglobin (Hb A1C)     Requested Prescriptions   Signed Prescriptions Disp Refills   glimepiride (AMARYL) 4 MG tablet 180 tablet 0    Sig: Take 2 tablets (8 mg total) by mouth daily before breakfast. Must eat before taking medication   metFORMIN (GLUCOPHAGE) 500 MG tablet 360 tablet 0    Sig: Take 2 tablets (1,000 mg total) by mouth 2 (two) times daily with a meal.   Insulin Pen Needle (PEN NEEDLES) 31G X 8 MM MISC 100 each 0    Sig: UAD   insulin glargine (LANTUS SOLOSTAR) 100 UNIT/ML Solostar Pen 15 mL 2    Sig: 24 units subcut daily at bedtime.   losartan (COZAAR) 50 MG tablet 90 tablet 0    Sig: Take 1 tablet (50 mg total) by mouth daily.   gabapentin (NEURONTIN) 300 MG capsule 90 capsule 0    Sig: Take 1 capsule (300 mg total) by mouth at bedtime.    Follow-up with primary provider as scheduled.   Camillia Herter, NP

## 2021-02-16 ENCOUNTER — Other Ambulatory Visit: Payer: Self-pay

## 2021-02-16 ENCOUNTER — Encounter: Payer: Self-pay | Admitting: Family

## 2021-02-16 ENCOUNTER — Ambulatory Visit (INDEPENDENT_AMBULATORY_CARE_PROVIDER_SITE_OTHER): Payer: Medicaid Other | Admitting: Family

## 2021-02-16 VITALS — BP 125/86 | HR 79 | Temp 98.3°F | Resp 18 | Ht 64.02 in | Wt 218.0 lb

## 2021-02-16 DIAGNOSIS — M79641 Pain in right hand: Secondary | ICD-10-CM | POA: Diagnosis not present

## 2021-02-16 DIAGNOSIS — E1142 Type 2 diabetes mellitus with diabetic polyneuropathy: Secondary | ICD-10-CM | POA: Diagnosis not present

## 2021-02-16 DIAGNOSIS — R809 Proteinuria, unspecified: Secondary | ICD-10-CM | POA: Diagnosis not present

## 2021-02-16 DIAGNOSIS — R6882 Decreased libido: Secondary | ICD-10-CM | POA: Diagnosis not present

## 2021-02-16 DIAGNOSIS — Z794 Long term (current) use of insulin: Secondary | ICD-10-CM | POA: Diagnosis not present

## 2021-02-16 DIAGNOSIS — E1141 Type 2 diabetes mellitus with diabetic mononeuropathy: Secondary | ICD-10-CM

## 2021-02-16 LAB — POCT GLYCOSYLATED HEMOGLOBIN (HGB A1C): Hemoglobin A1C: 9.8 % — AB (ref 4.0–5.6)

## 2021-02-16 MED ORDER — GABAPENTIN 300 MG PO CAPS: 300.0000 mg | ORAL_CAPSULE | Freq: Every day | ORAL | 0 refills | Status: DC

## 2021-02-16 MED ORDER — METFORMIN HCL 500 MG PO TABS: 1000.0000 mg | ORAL_TABLET | Freq: Two times a day (BID) | ORAL | 0 refills | Status: DC

## 2021-02-16 MED ORDER — LANTUS SOLOSTAR 100 UNIT/ML ~~LOC~~ SOPN: PEN_INJECTOR | SUBCUTANEOUS | 2 refills | Status: DC

## 2021-02-16 MED ORDER — LOSARTAN POTASSIUM 50 MG PO TABS: 50.0000 mg | ORAL_TABLET | Freq: Every day | ORAL | 0 refills | Status: DC

## 2021-02-16 MED ORDER — PEN NEEDLES 31G X 8 MM MISC: 0 refills | Status: DC

## 2021-02-16 MED ORDER — GLIMEPIRIDE 4 MG PO TABS: 8.0000 mg | ORAL_TABLET | Freq: Every day | ORAL | 0 refills | Status: DC

## 2021-02-16 NOTE — Patient Instructions (Signed)
Insulin Glargine Injection What is this medication? INSULIN GLARGINE (IN su lin GLAR geen) treats diabetes. It works by increasing insulin levels in your body, which decreases your blood sugar (glucose). It belongs to a group of medications called long-acting insulins or basal insulins. Changes to diet and exercise are often combined with this medication. This medicine may be used for other purposes; ask your health care provider or pharmacist if you have questions. COMMON BRAND NAME(S): BASAGLAR, Lantus, Lantus SoloStar, Semglee, Toujeo Max SoloStar, [employer] What should I tell my care team before I take this medication? They need to know if you have any of these conditions: Episodes of low blood sugar Eye disease, vision problems Kidney disease Liver disease An unusual or allergic reaction to insulin, metacresol, other medications, foods, dyes, or preservatives Pregnant or trying to get pregnant Breast-feeding How should I use this medication? This medication is for injection under the skin. Use this medication at the same time each day. Use exactly as directed. This insulin should never be mixed in the same syringe with other insulins before injection. Do not vigorously shake before use. You will be taught how to use this medication and how to adjust doses for activities and illness. Do not use more insulin than prescribed. Always check the appearance of your insulin before using it. This medication should be clear and colorless like water. Do not use it if it is cloudy, thickened, colored, or has solid particles in it. If you use an insulin pen, be sure to take off the outer needle cover before using the dose. It is important that you put your used needles and syringes in a special sharps container. Do not put them in a trash can. If you do not have a sharps container, call your pharmacist or care team to get one. This medication comes with INSTRUCTIONS FOR USE. Ask your pharmacist for  directions on how to use this medication. Read the information carefully. Talk to your pharmacist or care team if you have questions. Talk to your care team regarding the use of this medication in children. While this medication may be prescribed for children as young as 6 years for selected conditions, precautions do apply. Overdosage: If you think you have taken too much of this medicine contact a poison control center or emergency room at once. NOTE: This medicine is only for you. Do not share this medicine with others. What if I miss a dose? It is important not to miss a dose. Your care team should discuss a plan for missed doses with you. If you do miss a dose, follow their plan. Do not take double doses. What may interact with this medication? Alcohol containing beverages Antiviral medications for HIV or AIDS Aspirin and aspirin-like medications Beta-blockers like atenolol, metoprolol, propranolol Certain medications for blood pressure, heart disease, irregular heart beat Chromium Clonidine Diuretics Female hormones, such as estrogens or progestins, birth control pills Fenofibrate Gemfibrozil Guanethidine Isoniazid Lanreotide Female hormones or anabolic steroids MAOIs like Carbex, Eldepryl, Marplan, Nardil, and Parnate Medications for weight loss Medications for allergies, asthma, cold, or cough Medications for depression, anxiety, or psychotic disturbances Niacin Nicotine NSAIDs, medications for pain and inflammation, like ibuprofen or naproxen Octreotide Other medications for diabetes, like glyburide, glipizide, or glimepiride Pasireotide Pentamidine Phenytoin Probenecid Quinolone antibiotics such as ciprofloxacin, levofloxacin, ofloxacin Reserpine Some herbal dietary supplements Steroid medications such as prednisone or cortisone Sulfamethoxazole; trimethoprim Thyroid hormones This list may not describe all possible interactions. Give your health care provider a  list  of all the medicines, herbs, non-prescription drugs, or dietary supplements you use. Also tell them if you smoke, drink alcohol, or use illegal drugs. Some items may interact with your medicine. What should I watch for while using this medication? Visit your care team for regular checks on your progress. Do not drive, use machinery, or do anything that needs mental alertness until you know how this medication affects you. Alcohol may interfere with the effect of this medication. Avoid alcoholic drinks. A test called the HbA1C (A1C) will be monitored. This is a simple blood test. It measures your blood sugar control over the last 2 to 3 months. You will receive this test every 3 to 6 months. Learn how to check your blood sugar. Learn the symptoms of low and high blood sugar and how to manage them. Always carry a quick-source of sugar with you in case you have symptoms of low blood sugar. Examples include hard sugar candy or glucose tablets. Make sure others know that you can choke if you eat or drink when you develop serious symptoms of low blood sugar, such as seizures or unconsciousness. They must get medical help at once. Tell your care team if you have high blood sugar. You might need to change the dose of your medication. If you are sick or exercising more than usual, you might need to change the dose of your medication. Do not skip meals. Ask your care team if you should avoid alcohol. Many nonprescription cough and cold products contain sugar or alcohol. These can affect blood sugar. Make sure that you have the right kind of syringe for the type of insulin you use. Try not to change the brand and type of insulin or syringe unless your care team tells you to. Switching insulin brand or type can cause dangerously high or low blood sugar. Always keep an extra supply of insulin, syringes, and needles on hand. Use a syringe one time only. Throw away syringe and needle in a closed container to prevent  accidental needle sticks. Insulin pens and cartridges should never be shared. Even if the needle is changed, sharing may result in passing of viruses like hepatitis or HIV. Each time you get a new box of pen needles, check to see if they are the same type as the ones you were trained to use. If not, ask your care team to show you how to use this new type properly. Wear a medical ID bracelet or chain, and carry a card that describes your disease and details of your medication and dosage times. What side effects may I notice from receiving this medication? Side effects that you should report to your care team as soon as possible: Allergic reactions--skin rash, itching, hives, swelling of the face, lips, tongue, or throat Low blood sugar (hypoglycemia)--tremors or shaking, anxiety, sweating, cold or clammy skin, confusion, dizziness, rapid heartbeat Low potassium level--muscle pain or cramps, unusual weakness or fatigue, fast or irregular heartbeat, constipation Side effects that usually do not require medical attention (report to your care team if they continue or are bothersome): Lipodystrophy--hardening or scarring of tissue at injection site Pain, redness, or irritation at injection site Weight gain This list may not describe all possible side effects. Call your doctor for medical advice about side effects. You may report side effects to FDA at 1-800-FDA-1088. Where should I keep my medication? Keep out of the reach of children and pets. Unopened Vials: Lantus vials: Store in a refrigerator between 2 and  8 degrees C (36 and 46 degrees F) or at room temperature below 30 degrees C (86 degrees F). Do not freeze or use if the insulin has been frozen. Protect from light and excessive heat. If stored at room temperature, the vial must be discarded after 28 days. Throw away any unopened and unused medication that has been stored in the refrigerator after the expiration date. Unopened Pens: Transport planner: Store in a refrigerator between 2 and 8 degrees C (36 and 46 degrees F) or at room temperature below 30 degrees C (86 degrees F). Do not freeze or use if the insulin has been frozen. Protect from light and excessive heat. If stored at room temperature, the pen must be discarded after 28 days. Throw away any unopened and unused medication that has been stored in the refrigerator after the expiration date. Lantus Solostar Pens: Store in a refrigerator between 2 and 8 degrees C (36 and 46 degrees F) or at room temperature below 30 degrees C (86 degrees F). Do not freeze or use if the insulin has been frozen. Protect from light and excessive heat. If stored at room temperature, the pen must be discarded after 28 days. Throw away any unopened and unused medication that has been stored in the refrigerator after the expiration date. Semglee Pens: Store in a refrigerator between 2 and 8 degrees C (36 and 46 degrees F) or at room temperature below 30 degrees C (86 degrees F). Do not freeze or use if the insulin has been frozen. Protect from light and excessive heat. If stored at room temperature, the pen must be discarded after 28 days. Throw away any unopened and unused medication that has been stored in the refrigerator after the expiration date. Toujeo Solostar Pens or Toujeo Max [employer] Pens: Store in a refrigerator between 2 and 8 degrees C (36 and 46 degrees F). Do not freeze or use if the insulin has been frozen. Protect from light and excessive heat. Throw away any unopened and unused medication that has been stored in the refrigerator after the expiration date. Vials that you are using: Lantus vials: Store in a refrigerator or at room temperature below 30 degrees C (86 degrees F). Do not freeze. Keep away from heat and light. Throw the opened vial away after 28 days. Semglee vials: Store in a refrigerator or at room temperature below 30 degrees C (86 degrees F). Do not freeze. Keep away from heat  and light. Throw the opened vial away after 28 days. Pens that you are using: Basaglar KwikPens: Store at room temperature below 30 degrees C (86 degrees F). Do not refrigerate or freeze. Keep away from heat and light. Throw the pen away after 28 days, even if it still has insulin left in it. Lantus Solostar Pens: Store at room temperature below 30 degrees C (86 degrees F). Do not refrigerate or freeze. Keep away from heat and light. Throw the pen away after 28 days, even if it still has insulin left in it. Semglee Pens: Store at room temperature below 30 degrees C (86 degrees F). Do not refrigerate or freeze. Keep away from heat and light. Throw the pen away after 28 days, even if it still has insulin left in it. Toujeo Solostar Pens or Toujeo Max [employer] Pens: Store at room temperature below 30 degrees C (86 degrees F). Do not refrigerate or freeze. Keep away from heat and light. Throw the pen away after 56 days, even if it still has insulin  left in it. NOTE: This sheet is a summary. It may not cover all possible information. If you have questions about this medicine, talk to your doctor, pharmacist, or health care provider.  2022 [employer]vier/[employer] Standard (2020-03-28 00:00:00)

## 2021-02-16 NOTE — Progress Notes (Signed)
Diabetes discussed in office.

## 2021-02-16 NOTE — Progress Notes (Signed)
Pt presents for diabetes follow-up, has complaints of dry mouth

## 2021-02-17 LAB — BASIC METABOLIC PANEL
BUN/Creatinine Ratio: 10 (ref 9–23)
BUN: 8 mg/dL (ref 6–24)
CO2: 25 mmol/L (ref 20–29)
Calcium: 9.2 mg/dL (ref 8.7–10.2)
Chloride: 99 mmol/L (ref 96–106)
Creatinine, Ser: 0.78 mg/dL (ref 0.57–1.00)
Glucose: 266 mg/dL — ABNORMAL HIGH (ref 70–99)
Potassium: 4.3 mmol/L (ref 3.5–5.2)
Sodium: 137 mmol/L (ref 134–144)
eGFR: 95 mL/min/{1.73_m2} (ref >59)

## 2021-02-17 IMAGING — MG DIGITAL SCREENING BILAT W/ TOMO W/ CAD
8 series · 8 of 24 positions shown · non-contrast
Comparison: Previous exam(s).

CLINICAL DATA: Screening.

EXAM:
DIGITAL SCREENING BILATERAL MAMMOGRAM WITH TOMO AND CAD

[R MLO synth-2D]
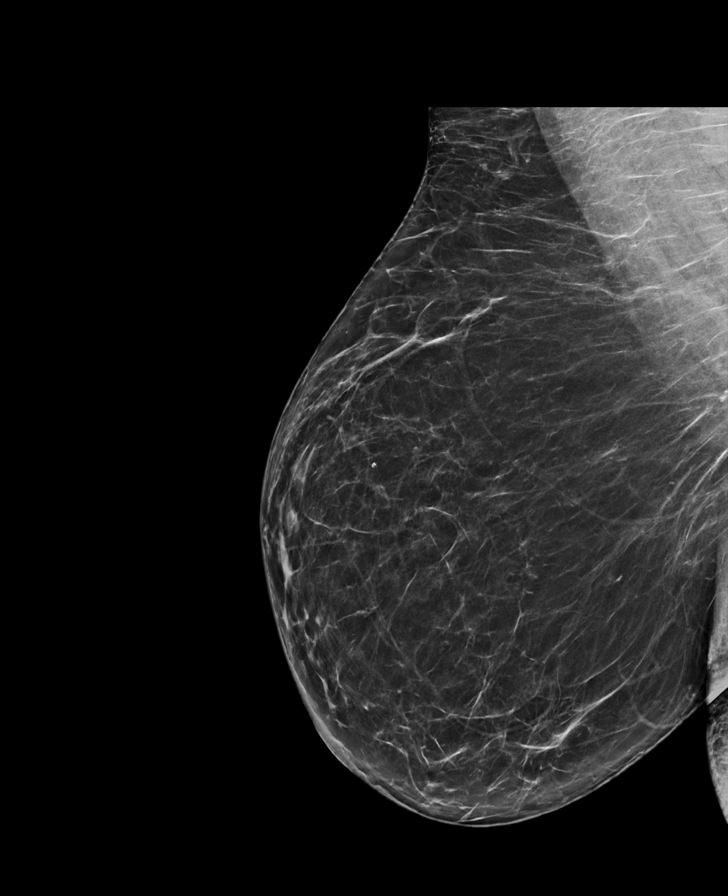

[R CC synth-2D]
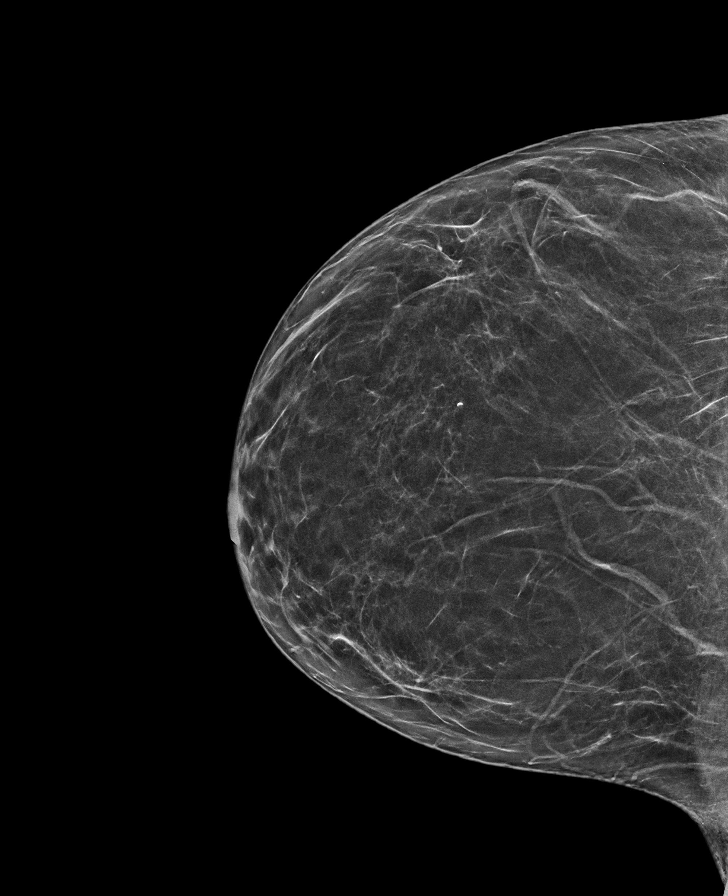

[L CC synth-2D]
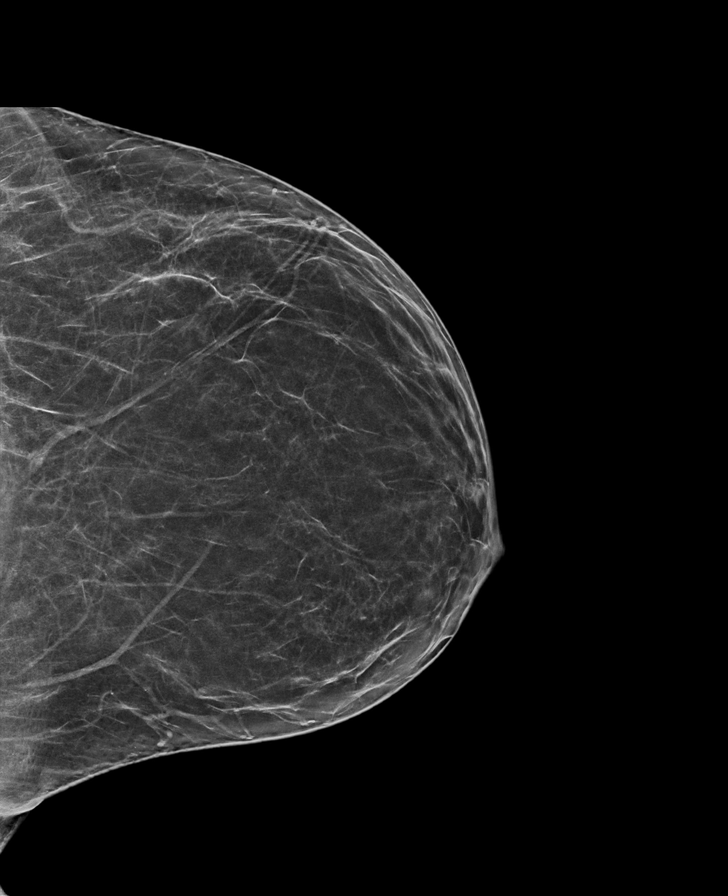

[L MLO synth-2D]
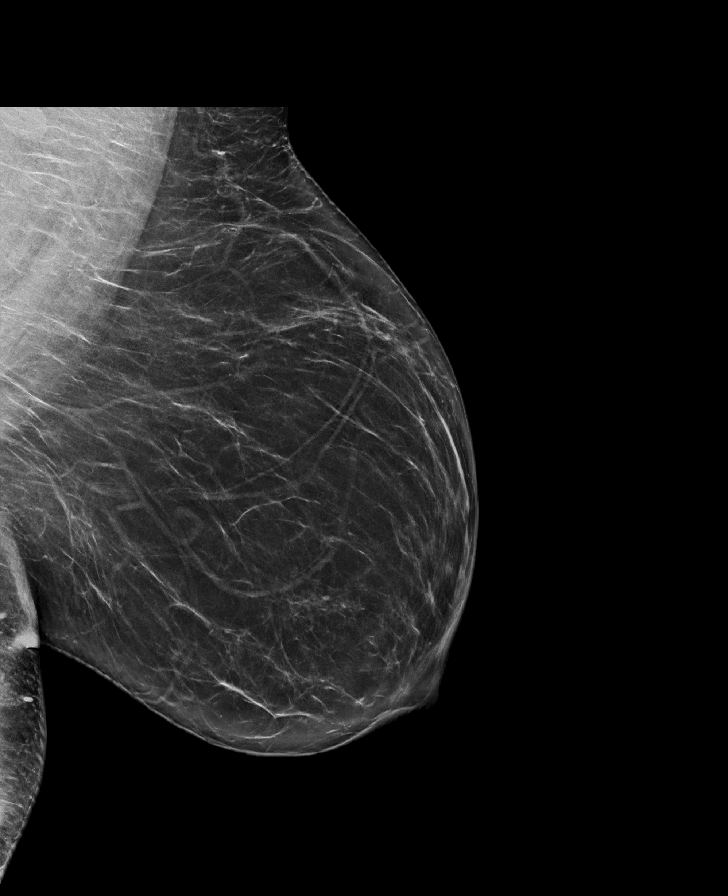

[R CC tomo · tomo slice 33/64.0]
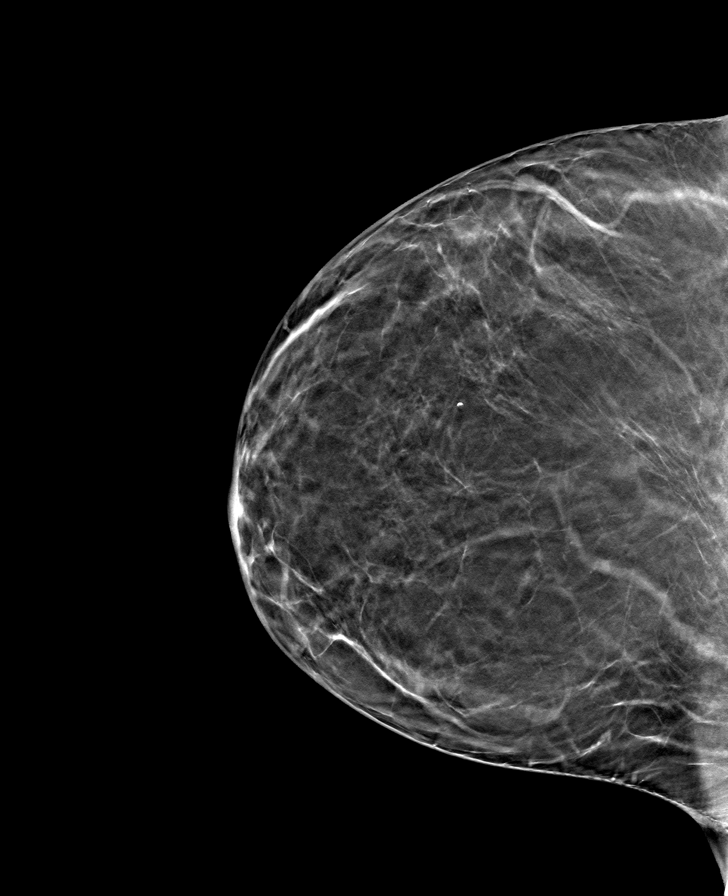

[L CC tomo · tomo slice 33/64.0]
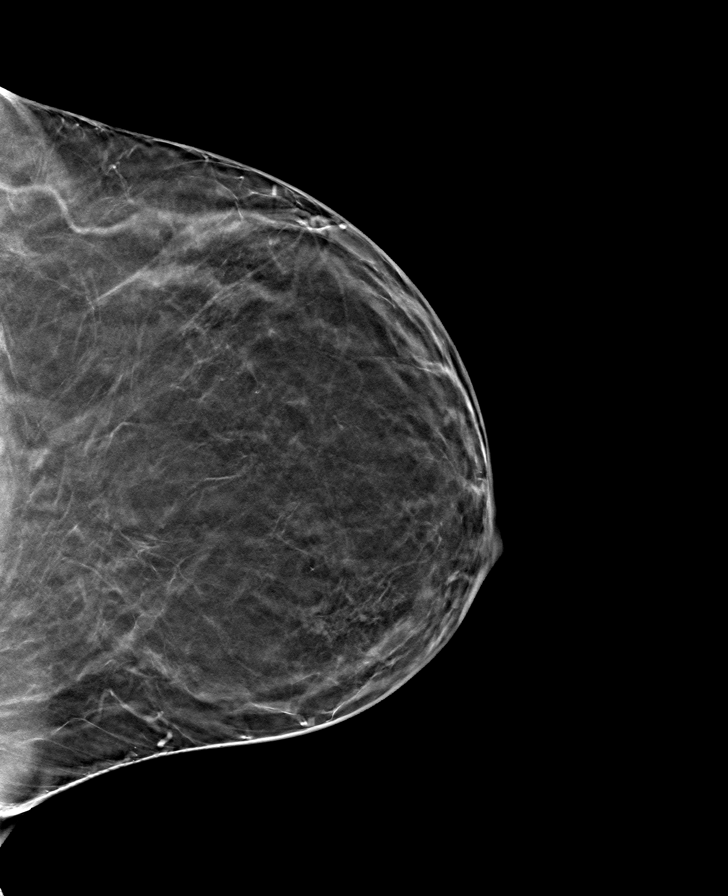

[R MLO tomo · tomo slice 37/73.0]
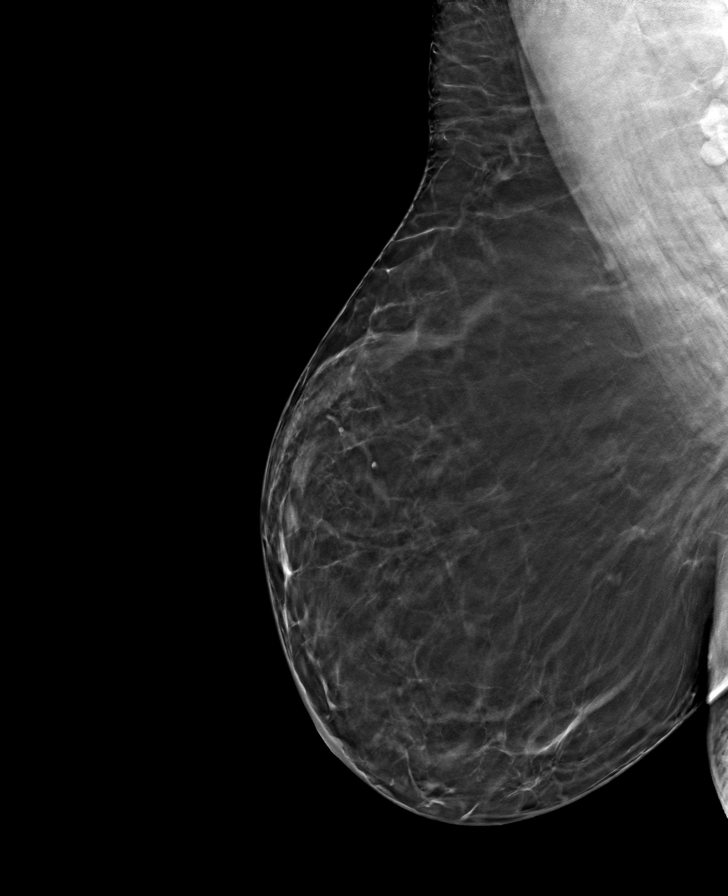

[L MLO tomo · tomo slice 39/76.0]
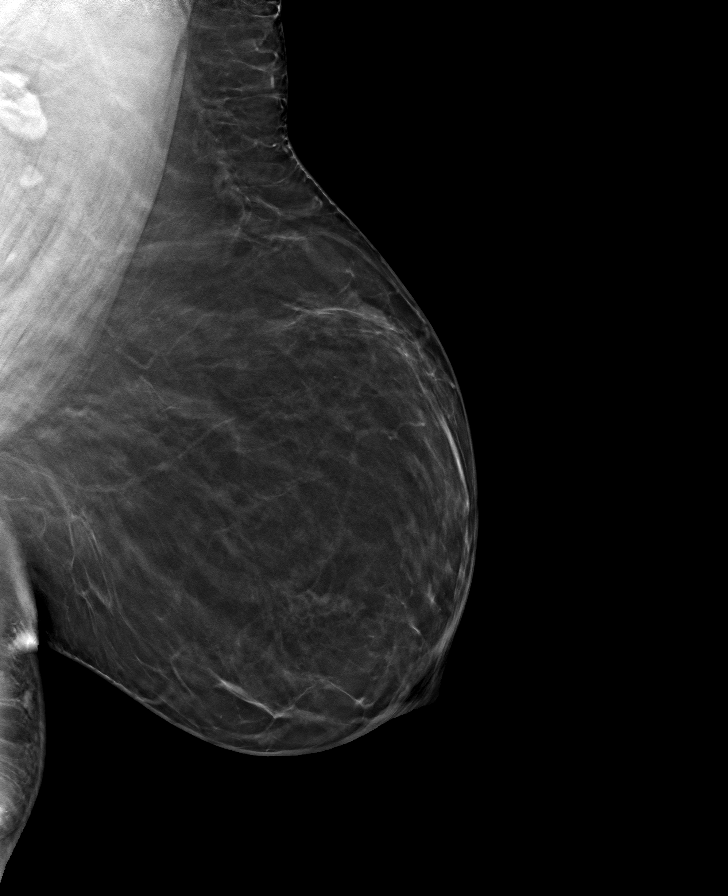

[8 of 24 positions shown; findings below may reference images not displayed]

ACR Breast Density Category b: There are scattered areas of
fibroglandular density.
FINDINGS: There are no findings suspicious for malignancy. Images were
processed with CAD.
IMPRESSION: No mammographic evidence of malignancy. A result letter of this
screening mammogram will be mailed directly to the patient.

RECOMMENDATION:
Screening mammogram in one year. (Code:[id number])

BI-RADS CATEGORY  1: Negative.

## 2021-02-17 NOTE — Progress Notes (Signed)
Kidney function normal. Sodium and calcium back to normal.

## 2021-02-18 ENCOUNTER — Ambulatory Visit: Payer: Medicaid Other | Admitting: Obstetrics and Gynecology

## 2021-02-18 DIAGNOSIS — M79641 Pain in right hand: Secondary | ICD-10-CM | POA: Diagnosis not present

## 2021-02-22 DIAGNOSIS — M79641 Pain in right hand: Secondary | ICD-10-CM | POA: Diagnosis not present

## 2021-02-25 DIAGNOSIS — M79641 Pain in right hand: Secondary | ICD-10-CM | POA: Diagnosis not present

## 2021-03-04 DIAGNOSIS — M79641 Pain in right hand: Secondary | ICD-10-CM | POA: Diagnosis not present

## 2021-03-05 ENCOUNTER — Other Ambulatory Visit: Payer: Self-pay | Admitting: Family

## 2021-03-05 DIAGNOSIS — E1141 Type 2 diabetes mellitus with diabetic mononeuropathy: Secondary | ICD-10-CM

## 2021-03-05 DIAGNOSIS — Z794 Long term (current) use of insulin: Secondary | ICD-10-CM

## 2021-03-11 ENCOUNTER — Other Ambulatory Visit: Payer: Self-pay | Admitting: Internal Medicine

## 2021-03-11 DIAGNOSIS — E119 Type 2 diabetes mellitus without complications: Secondary | ICD-10-CM

## 2021-03-12 NOTE — Telephone Encounter (Signed)
Freestyle Libre refilled as requested.

## 2021-03-12 NOTE — Telephone Encounter (Signed)
Freestyle [place] readers refilled as requested.

## 2021-03-18 ENCOUNTER — Telehealth: Payer: Self-pay

## 2021-03-18 NOTE — Telephone Encounter (Signed)
PA completed for [employer] device [employer]. Pending insurance approval

## 2021-03-25 DIAGNOSIS — M79641 Pain in right hand: Secondary | ICD-10-CM | POA: Diagnosis not present

## 2021-03-25 DIAGNOSIS — L03011 Cellulitis of right finger: Secondary | ICD-10-CM | POA: Diagnosis not present

## 2021-04-11 NOTE — Progress Notes (Signed)
? ? ?Patient ID: April Byrd, female    DOB: 1975/10/14  MRN: [id number] ? ?CC: Diabetes Follow-Up ? ?Subjective: ?April Byrd is a 46 y.o. female who presents for diabetes follow-up.  ? ?Her concerns today include:  ?DIABETES TYPE 2 FOLLOW-UP: ?02/16/2021: ?- Hemoglobin A1c today not at goal at 9.8%, goal < 7%.  ?- Continue Metformin, Glimepiride, and Insulin Glargine as prescribed.  ?- Referral to Endocrinology for further evaluation and management.  ? ?04/16/2021: ?Has not scheduled with Endocrinology as of present. Still taking all medications as prescribed. Monitoring what she eats. Blood sugars have been stable. Would like to begin Ozempic for both diabetes and weight management.  ? ? ?Patient Active Problem List  ? Diagnosis Date Noted  ? Pain in right hand 02/25/2021  ? Microalbuminuria 06/29/2020  ? Elevated blood-pressure reading without diagnosis of hypertension 01/09/2020  ? Migraine without aura, not refractory 01/09/2020  ? Diabetes mellitus ([place]) 12/11/2018  ? Candidal vulvovaginitis 10/10/2018  ? Abnormal uterine bleeding 10/10/2018  ? Osteoarthritis of feet, bilateral 02/12/2018  ? Primary osteoarthritis of both knees 02/12/2018  ? Osteoarthritis of both hands 02/12/2018  ? Unspecified asthma(493.90) 04/26/2012  ? Obesity with body mass index 30 or greater 04/26/2012  ?  ? ?Current Outpatient Medications on File Prior to Visit  ?Medication Sig Dispense Refill  ? Accu-Chek FastClix Lancets MISC USE AS DIRECTED ONCE DAILY TO  TEST  BLOOD  SUGAR DXE11.65 102 each 0  ? bacitracin ointment Apply 1 application topically 3 (three) times daily. Apply to nail bed 120 g 0  ? gabapentin (NEURONTIN) 300 MG capsule Take 1 capsule (300 mg total) by mouth at bedtime. 90 capsule 0  ? glimepiride (AMARYL) 4 MG tablet Take 2 tablets (8 mg total) by mouth daily before breakfast. Must eat before taking medication 180 tablet 0  ? glucose blood (ACCU-CHEK GUIDE) test strip Test 3 times daily DxE11.65 100 each 2  ?  insulin glargine (LANTUS SOLOSTAR) 100 UNIT/ML Solostar Pen 24 units subcut daily at bedtime. 15 mL 2  ? losartan (COZAAR) 50 MG tablet Take 1 tablet (50 mg total) by mouth daily. 90 tablet 0  ? metFORMIN (GLUCOPHAGE) 500 MG tablet Take 2 tablets (1,000 mg total) by mouth 2 (two) times daily with a meal. 360 tablet 0  ? ?No current facility-administered medications on file prior to visit.  ? ? ?Allergies  ?Allergen Reactions  ? Penicillins Other (See Comments)  ?  Has patient had a PCN reaction causing immediate rash, facial/tongue/throat swelling, SOB or lightheadedness with hypotension: Unknown ?Has patient had a PCN reaction causing severe rash involving mucus membranes or skin necrosis: Unknown ?Has patient had a PCN reaction that required hospitalization: Unknown ?Has patient had a PCN reaction occurring within the last 10 years: No ?If all of the above answers are "NO", then may proceed with Cephalosporin use. ? ?Has patient had a PCN reaction causing immediate rash, facial/tongue/throat swelling, SOB or lightheadedness with hypotension: Unknown ?Has patient had a PCN reaction causing severe rash involving mucus membranes or skin necrosis: Unknown ?Has patient had a PCN reaction that required hospitalization: Unknown ?Has patient had a PCN reaction occurring within the last 10 years: No ?If all of the above answers are "NO", then may proceed with Cephalosporin use.  ? ? ?Social History  ? ?Socioeconomic History  ? Marital status: Single  ?  Spouse name: Not on file  ? Number of children: Not on file  ? Years of education: Not on file  ?  Highest education level: Not on file  ?Occupational History  ? Not on file  ?Tobacco Use  ? Smoking status: Never  ? Smokeless tobacco: Never  ?Vaping Use  ? Vaping Use: Never used  ?Substance and Sexual Activity  ? Alcohol use: Yes  ?  Comment: social   ? Drug use: No  ? Sexual activity: Yes  ?  Birth control/protection: Condom, Surgical  ?Other Topics Concern  ? Not on file   ?Social History Narrative  ? Not on file  ? ?Social Determinants of Health  ? ?Financial Resource Strain: Not on file  ?Food Insecurity: Not on file  ?Transportation Needs: Not on file  ?Physical Activity: Not on file  ?Stress: Not on file  ?Social Connections: Not on file  ?Intimate Partner Violence: Not on file  ? ? ?Family History  ?Problem Relation Age of Onset  ? Diabetes Mother   ? Hypertension Mother   ? Kidney disease Father   ? Healthy Brother   ? Healthy Son   ? Healthy Son   ? Healthy Son   ? ? ?Past Surgical History:  ?Procedure Laterality Date  ? CESAREAN SECTION    ? x 3  ? CHOLECYSTECTOMY N/A 04/25/2012  ? Procedure: LAPAROSCOPIC CHOLECYSTECTOMY WITH INTRAOPERATIVE CHOLANGIOGRAM;  Surgeon: Edward Jolly, MD;  Location: [facility];  Service: General;  Laterality: N/A;  ? hydrocephalus surgery    ? I & D EXTREMITY Right 01/06/2021  ? Procedure: IRRIGATION AND DEBRIDEMENT OF INDEX FINGER;  Surgeon: Iran Planas, MD;  Location: [facility];  Service: Orthopedics;  Laterality: Right;  ? TUBAL LIGATION    ? ? ?ROS: ?Review of Systems ?Negative except as stated above ? ?PHYSICAL EXAM: ?BP 116/78 (BP Location: Left Arm, Patient Position: Sitting, Cuff Size: Large)   Pulse 97   Temp 98.3 ?F (36.8 ?C)   Resp 18   Ht 5' 4.02" (1.626 m)   Wt 219 lb (99.3 kg)   SpO2 98%   BMI 37.57 kg/m?  ? ?Physical Exam ?HENT:  ?   Head: Normocephalic and atraumatic.  ?Eyes:  ?   Extraocular Movements: Extraocular movements intact.  ?   Conjunctiva/sclera: Conjunctivae normal.  ?   Pupils: Pupils are equal, round, and reactive to light.  ?Cardiovascular:  ?   Rate and Rhythm: Normal rate and regular rhythm.  ?   Pulses: Normal pulses.  ?   Heart sounds: Normal heart sounds.  ?Pulmonary:  ?   Effort: Pulmonary effort is normal.  ?   Breath sounds: Normal breath sounds.  ?Musculoskeletal:  ?   Cervical back: Normal range of motion and neck supple.  ?Neurological:  ?   General: No focal deficit present.  ?   Mental Status: She  is alert and oriented to person, place, and time.  ?Psychiatric:     ?   Mood and Affect: Mood normal.     ?   Behavior: Behavior normal.  ? ?ASSESSMENT AND PLAN: ?1. Type 2 diabetes mellitus with diabetic mononeuropathy, with long-term current use of insulin ([place]): ?- Continue Metformin, Glimepiride, and Insulin Glargine as prescribed. No refills needed as of present. ?- Begin Semaglutide as prescribed. Counseled on medication adherence and adverse effects. Patient plans to inject weekly on Sunday.  ?- Discussed the importance of healthy eating habits, low-carbohydrate diet, low-sugar diet, regular aerobic exercise (at least 150 minutes a week as tolerated) and medication compliance to achieve or maintain control of diabetes. ?- Referral to Endocrinology for further evaluation and  management.  ?- Follow-up with primary provider as scheduled.  ?- Semaglutide,0.25 or 0.5MG/DOS, 2 MG/1.5ML SOPN; Inject 0.25 mg into the skin once a week.  Dispense: 0.75 mL; Refill: 0 ?- Insulin Pen Needle (PEN NEEDLES) 31G X 8 MM MISC; UAD  Dispense: 100 each; Refill: 0 ?- Ambulatory referral to Endocrinology ?- Continuous Blood Gluc Receiver (FREESTYLE LIBRE 2 READER) DEVI; 1 each by Other route 4 (four) times daily -  before meals and at bedtime.  Dispense: 1 each; Refill: 5 ?- Continuous Blood Gluc Sensor (FREESTYLE LIBRE 2 SENSOR) MISC; 1 each by Other route 4 (four) times daily -  before meals and at bedtime. USE AS DIRECTED  Dispense: 16 each; Refill: 4 ?- Blood Glucose Monitoring Suppl (ACCU-CHEK AVIVA PLUS) w/Device KIT; 1 each by Does not apply route 3 (three) times daily. Use to check blood sugars 3 times daily. ZDG64.40  Dispense: 1 kit; Refill: 0 ? ? ? ?Patient was given the opportunity to ask questions.  Patient verbalized understanding of the plan and was able to repeat key elements of the plan. Patient was given clear instructions to go to Emergency Department or return to medical center if symptoms don't improve,  worsen, or new problems develop.The patient verbalized understanding. ? ? ?Orders Placed This Encounter  ?Procedures  ? Ambulatory referral to Endocrinology  ? ? ? ?Requested Prescriptions  ? ?Signed Prescriptions Disp

## 2021-04-14 ENCOUNTER — Encounter: Payer: Self-pay | Admitting: Family

## 2021-04-16 ENCOUNTER — Other Ambulatory Visit: Payer: Self-pay | Admitting: Family

## 2021-04-16 ENCOUNTER — Other Ambulatory Visit: Payer: Self-pay

## 2021-04-16 ENCOUNTER — Ambulatory Visit (INDEPENDENT_AMBULATORY_CARE_PROVIDER_SITE_OTHER): Payer: Medicaid Other | Admitting: Family

## 2021-04-16 VITALS — BP 116/78 | HR 97 | Temp 98.3°F | Resp 18 | Ht 64.02 in | Wt 219.0 lb

## 2021-04-16 DIAGNOSIS — E1141 Type 2 diabetes mellitus with diabetic mononeuropathy: Secondary | ICD-10-CM

## 2021-04-16 DIAGNOSIS — Z794 Long term (current) use of insulin: Secondary | ICD-10-CM | POA: Diagnosis not present

## 2021-04-16 MED ORDER — FREESTYLE LIBRE 2 SENSOR MISC: 1.0000 | Freq: Three times a day (TID) | 4 refills | Status: DC

## 2021-04-16 MED ORDER — PEN NEEDLES 31G X 8 MM MISC: 0 refills | Status: DC

## 2021-04-16 MED ORDER — SEMAGLUTIDE(0.25 OR 0.5MG/DOS) 2 MG/1.5ML ~~LOC~~ SOPN: 0.2500 mg | PEN_INJECTOR | SUBCUTANEOUS | 0 refills | Status: DC

## 2021-04-16 MED ORDER — SEMAGLUTIDE (2 MG/DOSE) 8 MG/3ML ~~LOC~~ SOPN: 0.2500 mg | PEN_INJECTOR | SUBCUTANEOUS | 0 refills | Status: AC

## 2021-04-16 MED ORDER — FREESTYLE LIBRE 2 READER DEVI: 1.0000 | Freq: Three times a day (TID) | 5 refills | Status: DC

## 2021-04-16 MED ORDER — ACCU-CHEK AVIVA PLUS W/DEVICE KIT: 1.0000 | PACK | Freq: Three times a day (TID) | 0 refills | Status: DC

## 2021-04-16 NOTE — Patient Instructions (Signed)
Please call [facility] for diabetes management [address] Phone#:[phone]. ?Semaglutide Injection ?What is this medication? ?SEMAGLUTIDE (SEM a GLOO tide) treats type 2 diabetes. It works by increasing insulin levels in your body, which decreases your blood sugar (glucose). It also reduces the amount of sugar released into the blood and slows down your digestion. It can also be used to lower the risk of heart attack and stroke in people with type 2 diabetes. Changes to diet and exercise are often combined with this medication. ?This medicine may be used for other purposes; ask your health care provider or pharmacist if you have questions. ?COMMON BRAND NAME(S): OZEMPIC ?What should I tell my care team before I take this medication? ?They need to know if you have any of these conditions: ?Endocrine tumors (MEN 2) or if someone in your family had these tumors ?Eye disease, vision problems ?History of pancreatitis ?Kidney disease ?Stomach problems ?Thyroid cancer or if someone in your family had thyroid cancer ?An unusual or allergic reaction to semaglutide, other medications, foods, dyes, or preservatives ?Pregnant or trying to get pregnant ?Breast-feeding ?How should I use this medication? ?This medication is for injection under the skin of your upper leg (thigh), stomach area, or upper arm. It is given once every week (every 7 days). You will be taught how to prepare and give this medication. Use exactly as directed. Take your medication at regular intervals. Do not take it more often than directed. ?If you use this medication with insulin, you should inject this medication and the insulin separately. Do not mix them together. Do not give the injections right next to each other. Change (rotate) injection sites with each injection. ?It is important that you put your used needles and syringes in a special sharps container. Do not put them in a trash can. If you do not have a sharps  container, call your pharmacist or care team to get one. ?A special MedGuide will be given to you by the pharmacist with each prescription and refill. Be sure to read this information carefully each time. ?This medication comes with INSTRUCTIONS FOR USE. Ask your pharmacist for directions on how to use this medication. Read the information carefully. Talk to your pharmacist or care team if you have questions. ?Talk to your care team about the use of this medication in children. Special care may be needed. ?Overdosage: If you think you have taken too much of this medicine contact a poison control center or emergency room at once. ?NOTE: This medicine is only for you. Do not share this medicine with others. ?What if I miss a dose? ?If you miss a dose, take it as soon as you can within 5 days after the missed dose. Then take your next dose at your regular weekly time. If it has been longer than 5 days after the missed dose, do not take the missed dose. Take the next dose at your regular time. Do not take double or extra doses. If you have questions about a missed dose, contact your care team for advice. ?What may interact with this medication? ?Other medications for diabetes ?Many medications may cause changes in blood sugar, these include: ?Alcohol containing beverages ?Antiviral medications for HIV or AIDS ?Aspirin and aspirin-like medications ?Certain medications for blood pressure, heart disease, irregular heart beat ?Chromium ?Diuretics ?Female hormones, such as estrogens or progestins, birth control pills ?Fenofibrate ?Gemfibrozil ?Isoniazid ?Lanreotide ?Female hormones or anabolic steroids ?MAOIs like Carbex, Eldepryl, Marplan, Nardil, and  Parnate ?Medications for weight loss ?Medications for allergies, asthma, cold, or cough ?Medications for depression, anxiety, or psychotic disturbances ?Niacin ?Nicotine ?NSAIDs, medications for pain and inflammation, like ibuprofen or  naproxen ?Octreotide ?Pasireotide ?Pentamidine ?Phenytoin ?Probenecid ?Quinolone antibiotics such as ciprofloxacin, levofloxacin, ofloxacin ?Some herbal dietary supplements ?Steroid medications such as prednisone or cortisone ?Sulfamethoxazole; trimethoprim ?Thyroid hormones ?Some medications can hide the warning symptoms of low blood sugar (hypoglycemia). You may need to monitor your blood sugar more closely if you are taking one of these medications. These include: ?Beta-blockers, often used for high blood pressure or heart problems (examples include atenolol, metoprolol, propranolol) ?Clonidine ?Guanethidine ?Reserpine ?This list may not describe all possible interactions. Give your health care provider a list of all the medicines, herbs, non-prescription drugs, or dietary supplements you use. Also tell them if you smoke, drink alcohol, or use illegal drugs. Some items may interact with your medicine. ?What should I watch for while using this medication? ?Visit your care team for regular checks on your progress. ?Drink plenty of fluids while taking this medication. Check with your care team if you get an attack of severe diarrhea, nausea, and vomiting. The loss of too much body fluid can make it dangerous for you to take this medication. ?A test called the HbA1C (A1C) will be monitored. This is a simple blood test. It measures your blood sugar control over the last 2 to 3 months. You will receive this test every 3 to 6 months. ?Learn how to check your blood sugar. Learn the symptoms of low and high blood sugar and how to manage them. ?Always carry a quick-source of sugar with you in case you have symptoms of low blood sugar. Examples include hard sugar candy or glucose tablets. Make sure others know that you can choke if you eat or drink when you develop serious symptoms of low blood sugar, such as seizures or unconsciousness. They must get medical help at once. ?Tell your care team if you have high blood sugar.  You might need to change the dose of your medication. If you are sick or exercising more than usual, you might need to change the dose of your medication. ?Do not skip meals. Ask your care team if you should avoid alcohol. Many nonprescription cough and cold products contain sugar or alcohol. These can affect blood sugar. ?Pens should never be shared. Even if the needle is changed, sharing may result in passing of viruses like hepatitis or HIV. ?Wear a medical ID bracelet or chain, and carry a card that describes your disease and details of your medication and dosage times. ?Do not become pregnant while taking this medication. Women should inform their care team if they wish to become pregnant or think they might be pregnant. There is a potential for serious side effects to an unborn child. Talk to your care team for more information. ?What side effects may I notice from receiving this medication? ?Side effects that you should report to your care team as soon as possible: ?Allergic reactions--skin rash, itching, hives, swelling of the face, lips, tongue, or throat ?Change in vision ?Dehydration--increased thirst, dry mouth, feeling faint or lightheaded, headache, dark yellow or brown urine ?Gallbladder problems--severe stomach pain, nausea, vomiting, fever ?Heart palpitations--rapid, pounding, or irregular heartbeat ?Kidney injury--decrease in the amount of urine, swelling of the ankles, hands, or feet ?Pancreatitis--severe stomach pain that spreads to your back or gets worse after eating or when touched, fever, nausea, vomiting ?Thyroid cancer--new mass or lump in the neck,  pain or trouble swallowing, trouble breathing, hoarseness ?Side effects that usually do not require medical attention (report to your care team if they continue or are bothersome): ?Diarrhea ?Loss of appetite ?Nausea ?Stomach pain ?Vomiting ?This list may not describe all possible side effects. Call your doctor for medical advice about side  effects. You may report side effects to FDA at 1-800-FDA-1088. ?Where should I keep my medication? ?Keep out of the reach of children. ?Store unopened pens in a refrigerator between 2 and 8 degrees C (36 and 46 degrees F). Do not freeze. Protect from

## 2021-04-16 NOTE — Progress Notes (Signed)
Pt presents to discuss starting Ozempic ?Needs her freestyle libre refilled  ?

## 2021-04-23 ENCOUNTER — Telehealth: Payer: Self-pay | Admitting: Family

## 2021-04-23 NOTE — Telephone Encounter (Signed)
Pt called in regards to her RX for Ozempec. She said that the pharmacy could not fill it the way that it was written. Pt was told that they were going to fax something over. Please check. ?

## 2021-05-12 NOTE — Progress Notes (Signed)
Erroneous encounter

## 2021-05-17 ENCOUNTER — Encounter: Payer: Medicaid Other | Admitting: Family

## 2021-05-21 DIAGNOSIS — L03011 Cellulitis of right finger: Secondary | ICD-10-CM | POA: Insufficient documentation

## 2021-05-21 NOTE — Progress Notes (Signed)
? ? ?Patient ID: Felcia Huebert, female    DOB: [birth year]  MRN: [id number] ? ?CC: Annual Physical Exam ? ?Subjective: ?Aasiyah Auerbach is a 46 y.o. female who presents for annual physical exam.  ? ?Her concerns today include:  ?- Reports doing well on Ozempic. Confirmed with patient dosage of Ozempic is 0.25 mg weekly injection. Has noticed decreased appetite since beginning Ozempic otherwise no issues/concerns. Average home blood sugars 129. Has appointment scheduled with Endocrinology July 2023 and looking forward to repeat hemoglobin A1c to see improvement. Reports has an eye doctor at [facility]  ? ?Patient Active Problem List  ? Diagnosis Date Noted  ? Pain in right hand 02/25/2021  ? Microalbuminuria 06/29/2020  ? Elevated blood-pressure reading without diagnosis of hypertension 01/09/2020  ? Migraine without aura, not refractory 01/09/2020  ? Diabetes mellitus ([place]) 12/11/2018  ? Candidal vulvovaginitis 10/10/2018  ? Abnormal uterine bleeding 10/10/2018  ? Osteoarthritis of feet, bilateral 02/12/2018  ? Primary osteoarthritis of both knees 02/12/2018  ? Osteoarthritis of both hands 02/12/2018  ? Unspecified asthma(493.90) 04/26/2012  ? Obesity with body mass index 30 or greater 04/26/2012  ?  ? ?Current Outpatient Medications on File Prior to Visit  ?Medication Sig Dispense Refill  ? Accu-Chek FastClix Lancets MISC USE AS DIRECTED ONCE DAILY TO  TEST  BLOOD  SUGAR DXE11.65 102 each 0  ? bacitracin ointment Apply 1 application topically 3 (three) times daily. Apply to nail bed 120 g 0  ? Blood Glucose Monitoring Suppl (ACCU-CHEK AVIVA PLUS) w/Device KIT 1 each by Does not apply route 3 (three) times daily. Use to check blood sugars 3 times daily. DXE11.65 1 kit 0  ? Continuous Blood Gluc Receiver (FREESTYLE LIBRE 2 READER) DEVI 1 each by Other route 4 (four) times daily -  before meals and at bedtime. 1 each 5  ? Continuous Blood Gluc Sensor (FREESTYLE LIBRE 2 SENSOR) MISC 1 each by Other route 4 (four) times  daily -  before meals and at bedtime. USE AS DIRECTED 16 each 4  ? gabapentin (NEURONTIN) 300 MG capsule Take 1 capsule (300 mg total) by mouth at bedtime. 90 capsule 0  ? glimepiride (AMARYL) 4 MG tablet Take 2 tablets (8 mg total) by mouth daily before breakfast. Must eat before taking medication 180 tablet 0  ? glucose blood (ACCU-CHEK GUIDE) test strip Test 3 times daily DxE11.65 100 each 2  ? insulin glargine (LANTUS SOLOSTAR) 100 UNIT/ML Solostar Pen 24 units subcut daily at bedtime. 15 mL 2  ? Insulin Pen Needle (PEN NEEDLES) 31G X 8 MM MISC UAD 100 each 0  ? losartan (COZAAR) 50 MG tablet Take 1 tablet (50 mg total) by mouth daily. 90 tablet 0  ? metFORMIN (GLUCOPHAGE) 500 MG tablet Take 2 tablets (1,000 mg total) by mouth 2 (two) times daily with a meal. 360 tablet 0  ? ?No current facility-administered medications on file prior to visit.  ? ? ?Allergies  ?Allergen Reactions  ? Penicillins Other (See Comments)  ?  Has patient had a PCN reaction causing immediate rash, facial/tongue/throat swelling, SOB or lightheadedness with hypotension: Unknown ?Has patient had a PCN reaction causing severe rash involving mucus membranes or skin necrosis: Unknown ?Has patient had a PCN reaction that required hospitalization: Unknown ?Has patient had a PCN reaction occurring within the last 10 years: No ?If all of the above answers are "NO", then may proceed with Cephalosporin use. ? ?Has patient had a PCN reaction causing immediate rash, facial/tongue/throat swelling, SOB  or lightheadedness with hypotension: Unknown ?Has patient had a PCN reaction causing severe rash involving mucus membranes or skin necrosis: Unknown ?Has patient had a PCN reaction that required hospitalization: Unknown ?Has patient had a PCN reaction occurring within the last 10 years: No ?If all of the above answers are "NO", then may proceed with Cephalosporin use.  ? ? ?Social History  ? ?Socioeconomic History  ? Marital status: Single  ?  Spouse  name: Not on file  ? Number of children: Not on file  ? Years of education: Not on file  ? Highest education level: Not on file  ?Occupational History  ? Not on file  ?Tobacco Use  ? Smoking status: Never  ?  Passive exposure: Never  ? Smokeless tobacco: Never  ?Vaping Use  ? Vaping Use: Never used  ?Substance and Sexual Activity  ? Alcohol use: Yes  ?  Comment: social   ? Drug use: No  ? Sexual activity: Yes  ?  Birth control/protection: Condom, Surgical  ?Other Topics Concern  ? Not on file  ?Social History Narrative  ? Not on file  ? ?Social Determinants of Health  ? ?Financial Resource Strain: Not on file  ?Food Insecurity: Not on file  ?Transportation Needs: Not on file  ?Physical Activity: Not on file  ?Stress: Not on file  ?Social Connections: Not on file  ?Intimate Partner Violence: Not on file  ? ? ?Family History  ?Problem Relation Age of Onset  ? Diabetes Mother   ? Hypertension Mother   ? Kidney disease Father   ? Healthy Brother   ? Healthy Son   ? Healthy Son   ? Healthy Son   ? ? ?Past Surgical History:  ?Procedure Laterality Date  ? CESAREAN SECTION    ? x 3  ? CHOLECYSTECTOMY N/A 04/25/2012  ? Procedure: LAPAROSCOPIC CHOLECYSTECTOMY WITH INTRAOPERATIVE CHOLANGIOGRAM;  Surgeon: Edward Jolly, MD;  Location: [facility];  Service: General;  Laterality: N/A;  ? hydrocephalus surgery    ? I & D EXTREMITY Right 01/06/2021  ? Procedure: IRRIGATION AND DEBRIDEMENT OF INDEX FINGER;  Surgeon: Iran Planas, MD;  Location: [facility];  Service: Orthopedics;  Laterality: Right;  ? TUBAL LIGATION    ? ? ?ROS: ?Review of Systems ?Negative except as stated above ? ?PHYSICAL EXAM: ?BP 128/85 (BP Location: Left Arm, Patient Position: Sitting, Cuff Size: Large)   Pulse 91   Temp 98.3 ?F (36.8 ?C)   Resp 18   Ht 5' 4.02" (1.626 m)   Wt 220 lb (99.8 kg)   SpO2 98%   BMI 37.74 kg/m?  ? ?Physical Exam ?HENT:  ?   Head: Normocephalic and atraumatic.  ?   Right Ear: Tympanic membrane, ear canal and external ear normal.   ?   Left Ear: Tympanic membrane, ear canal and external ear normal.  ?   Nose: Nose normal. No congestion.  ?   Mouth/Throat:  ?   Mouth: Mucous membranes are moist.  ?   Pharynx: Oropharynx is clear.  ?Eyes:  ?   Extraocular Movements: Extraocular movements intact.  ?   Conjunctiva/sclera: Conjunctivae normal.  ?   Pupils: Pupils are equal, round, and reactive to light.  ?Cardiovascular:  ?   Rate and Rhythm: Normal rate and regular rhythm.  ?   Pulses: Normal pulses.  ?   Heart sounds: Normal heart sounds.  ?Pulmonary:  ?   Effort: Pulmonary effort is normal.  ?   Breath sounds: Normal breath  sounds.  ?Chest:  ?   Comments: Patient declined.  ?Abdominal:  ?   General: Bowel sounds are normal.  ?   Palpations: Abdomen is soft.  ?Genitourinary: ?   Comments: Patient declined.  ?Musculoskeletal:     ?   General: Normal range of motion.  ?   Cervical back: Normal range of motion and neck supple.  ?Skin: ?   General: Skin is warm and dry.  ?   Capillary Refill: Capillary refill takes less than 2 seconds.  ?Neurological:  ?   General: No focal deficit present.  ?   Mental Status: She is alert and oriented to person, place, and time.  ?Psychiatric:     ?   Mood and Affect: Mood normal.     ?   Behavior: Behavior normal.  ? ? ?ASSESSMENT AND PLAN: ?1. Annual physical exam: ?- Counseled on 150 minutes of exercise per week as tolerated, healthy eating (including decreased daily intake of saturated fats, cholesterol, added sugars, sodium), STI prevention, and routine healthcare maintenance. ? ?2. Screening for metabolic disorder: ?- Screening liver function. ?- Hepatic Function Panel ? ?3. Screening for deficiency anemia: ?- CBC to screen for anemia. ?- CBC ? ?4. Screening cholesterol level: ?- Lipid panel to screen for high cholesterol.  ?- Lipid panel ? ?5. Thyroid disorder screen: ?- TSH to check thyroid function.  ?- TSH ? ?6. Need for hepatitis C screening test: ?- Hepatitis C antibody to screen for hepatitis C.  ?-  Hepatitis C Antibody ? ?7. Encounter for screening mammogram for malignant neoplasm of breast: ?- Referral for breast cancer screening by mammogram.  ?- MM Digital Screening; Future ? ?8. Colon cancer screening: ?- Co

## 2021-05-28 ENCOUNTER — Ambulatory Visit (INDEPENDENT_AMBULATORY_CARE_PROVIDER_SITE_OTHER): Payer: Medicaid Other | Admitting: Family

## 2021-05-28 ENCOUNTER — Encounter: Payer: Self-pay | Admitting: Family

## 2021-05-28 VITALS — BP 128/85 | HR 91 | Temp 98.3°F | Resp 18 | Ht 64.02 in | Wt 220.0 lb

## 2021-05-28 DIAGNOSIS — Z1231 Encounter for screening mammogram for malignant neoplasm of breast: Secondary | ICD-10-CM

## 2021-05-28 DIAGNOSIS — Z130 Encounter for screening for diseases of the blood and blood-forming organs and certain disorders involving the immune mechanism: Secondary | ICD-10-CM | POA: Diagnosis not present

## 2021-05-28 DIAGNOSIS — Z13220 Encounter for screening for lipoid disorders: Secondary | ICD-10-CM

## 2021-05-28 DIAGNOSIS — Z13228 Encounter for screening for other metabolic disorders: Secondary | ICD-10-CM | POA: Diagnosis not present

## 2021-05-28 DIAGNOSIS — Z0000 Encounter for general adult medical examination without abnormal findings: Secondary | ICD-10-CM | POA: Diagnosis not present

## 2021-05-28 DIAGNOSIS — E119 Type 2 diabetes mellitus without complications: Secondary | ICD-10-CM

## 2021-05-28 DIAGNOSIS — Z1159 Encounter for screening for other viral diseases: Secondary | ICD-10-CM

## 2021-05-28 DIAGNOSIS — Z1329 Encounter for screening for other suspected endocrine disorder: Secondary | ICD-10-CM

## 2021-05-28 DIAGNOSIS — Z1211 Encounter for screening for malignant neoplasm of colon: Secondary | ICD-10-CM

## 2021-05-28 NOTE — Progress Notes (Signed)
Pt presents for annual physical exam ?Pt prefers Cologuard  ?

## 2021-05-31 ENCOUNTER — Other Ambulatory Visit: Payer: Medicaid Other

## 2021-06-10 ENCOUNTER — Other Ambulatory Visit: Payer: Self-pay | Admitting: Family

## 2021-06-10 DIAGNOSIS — Z794 Long term (current) use of insulin: Secondary | ICD-10-CM

## 2021-06-10 NOTE — Telephone Encounter (Signed)
Discontinued 04/16/21. Not on current med profile. Requested Prescriptions  Pending Prescriptions Disp Refills  . OZEMPIC, 0.25 OR 0.5 MG/DOSE, 2 MG/1.5ML SOPN [Pharmacy Med Name: Ozempic (0.25 or 0.5 MG/DOSE) 2 MG/1.5ML Subcutaneous Solution Pen-injector] 2 mL 0    Sig: INJECT 0.25MG  INTO THE SKIN ONCE A WEEK     Endocrinology:  Diabetes - GLP-1 Receptor Agonists - semaglutide Failed - 06/10/2021  4:14 PM      Failed - HBA1C in normal range and within 180 days    Hemoglobin A1C  Date Value Ref Range Status  02/16/2021 9.8 (A) 4.0 - 5.6 % Final   HbA1c, POC (controlled diabetic range)  Date Value Ref Range Status  11/13/2020 8.6 (A) 0.0 - 7.0 % Final         Passed - Cr in normal range and within 360 days    Creatinine, Ser  Date Value Ref Range Status  02/16/2021 0.78 0.57 - 1.00 mg/dL Final         Passed - Valid encounter within last 6 months    Recent Outpatient Visits          1 week ago Annual physical exam   Primary Care at [facility], Amy J, NP   1 month ago Type 2 diabetes mellitus with diabetic mononeuropathy, with long-term current use of insulin [facility])   Primary Care at [facility], Amy J, NP   3 months ago Type 2 diabetes mellitus with diabetic mononeuropathy, with long-term current use of insulin [facility])   Primary Care at [facility], Amy J, NP   6 months ago Type 2 diabetes mellitus with diabetic mononeuropathy, with long-term current use of insulin [facility])   Primary Care at [facility], Amy J, NP   6 months ago Type 2 diabetes mellitus with diabetic mononeuropathy, with long-term current use of insulin [facility])   Primary Care at [facility], Salomon Fick, NP

## 2021-06-29 ENCOUNTER — Ambulatory Visit: Payer: Medicaid Other

## 2021-07-08 ENCOUNTER — Telehealth: Payer: Self-pay | Admitting: Family

## 2021-07-08 DIAGNOSIS — Z794 Long term (current) use of insulin: Secondary | ICD-10-CM

## 2021-07-09 NOTE — Telephone Encounter (Signed)
Refused Ozempic 0.25 because it was discontinued 04/16/2021.

## 2021-07-09 NOTE — Telephone Encounter (Signed)
Pt wanted to know why this Rx was denied, please advise.

## 2021-07-13 NOTE — Telephone Encounter (Signed)
Pt made another call, pt wants to know why this Rx was discontinued, please advise.

## 2021-07-16 ENCOUNTER — Other Ambulatory Visit: Payer: Self-pay | Admitting: Family

## 2021-07-16 DIAGNOSIS — E1141 Type 2 diabetes mellitus with diabetic mononeuropathy: Secondary | ICD-10-CM

## 2021-07-16 MED ORDER — SEMAGLUTIDE(0.25 OR 0.5MG/DOS) 2 MG/1.5ML ~~LOC~~ SOPN
0.2500 mg | PEN_INJECTOR | SUBCUTANEOUS | 0 refills | Status: DC
Start: 2021-07-16 — End: 2021-11-22

## 2021-07-20 ENCOUNTER — Other Ambulatory Visit: Payer: Self-pay | Admitting: Family

## 2021-07-20 DIAGNOSIS — Z794 Long term (current) use of insulin: Secondary | ICD-10-CM

## 2021-07-20 NOTE — Telephone Encounter (Signed)
Requested medication (s) are due for refill today:   Yes  Requested medication (s) are on the active medication list:   Yes  Future visit scheduled:   No  Had physical a month ago   Last ordered: 07/16/2021 1.5 ml, 0 refills.  Returned because pharmacy needs a new rx.   See telephone encounter note for explanation from pharmacy on what they need.     Requested Prescriptions  Pending Prescriptions Disp Refills   Semaglutide,0.25 or 0.5MG /DOS, 2 MG/1.5ML SOPN 1.5 mL 0    Sig: Inject 0.25 mg into the skin once a week.     Endocrinology:  Diabetes - GLP-1 Receptor Agonists - semaglutide Failed - 07/20/2021 10:35 AM      Failed - HBA1C in normal range and within 180 days    Hemoglobin A1C  Date Value Ref Range Status  02/16/2021 9.8 (A) 4.0 - 5.6 % Final   HbA1c, POC (controlled diabetic range)  Date Value Ref Range Status  11/13/2020 8.6 (A) 0.0 - 7.0 % Final         Passed - Cr in normal range and within 360 days    Creatinine, Ser  Date Value Ref Range Status  02/16/2021 0.78 0.57 - 1.00 mg/dL Final         Passed - Valid encounter within last 6 months    Recent Outpatient Visits           1 month ago Annual physical exam   Primary Care at [facility], Amy J, NP   3 months ago Type 2 diabetes mellitus with diabetic mononeuropathy, with long-term current use of insulin [facility])   Primary Care at [facility], Amy J, NP   5 months ago Type 2 diabetes mellitus with diabetic mononeuropathy, with long-term current use of insulin [facility])   Primary Care at [facility], Amy J, NP   7 months ago Type 2 diabetes mellitus with diabetic mononeuropathy, with long-term current use of insulin [facility])   Primary Care at [facility], Amy J, NP   8 months ago Type 2 diabetes mellitus with diabetic mononeuropathy, with long-term current use of insulin [facility])   Primary Care at [facility], Salomon Fick, NP

## 2021-07-28 ENCOUNTER — Telehealth: Payer: Self-pay | Admitting: Family

## 2021-07-28 NOTE — Telephone Encounter (Signed)
Spoke to pt adv that provider stated to continue other diabetic medications until appt w/Endocrinology on 07/17

## 2021-07-28 NOTE — Telephone Encounter (Signed)
Copied from CRM [id number]. Topic: General - Other >> Jul 28, 2021 11:25 AM Everette C wrote: Reason for CRM: The patient has been directed to contact their PCP and request that a new prescription for their ozempic be submitted the current dose being prescribed is no longer made by the manufacturer   The current dose that the patient is being prescribed no longer comes in pen for, per their pharmacist   Please contact the patient further if needed

## 2021-07-29 NOTE — Telephone Encounter (Signed)
Pharmacy needing clarification on directions  Pharmacy inquiring if patient should inject .25 for 4 wks then increase to .5 once a wk  Pharmacy also stated dosage is now  2 mg per 25ml  Please advise and fu w/ pharmacy

## 2021-07-30 ENCOUNTER — Other Ambulatory Visit: Payer: Self-pay | Admitting: *Deleted

## 2021-07-30 ENCOUNTER — Other Ambulatory Visit: Payer: Self-pay | Admitting: Family

## 2021-07-30 ENCOUNTER — Other Ambulatory Visit: Payer: Medicaid Other

## 2021-07-30 DIAGNOSIS — Z130 Encounter for screening for diseases of the blood and blood-forming organs and certain disorders involving the immune mechanism: Secondary | ICD-10-CM

## 2021-07-30 DIAGNOSIS — Z13220 Encounter for screening for lipoid disorders: Secondary | ICD-10-CM

## 2021-07-30 DIAGNOSIS — Z13228 Encounter for screening for other metabolic disorders: Secondary | ICD-10-CM

## 2021-07-30 DIAGNOSIS — Z1159 Encounter for screening for other viral diseases: Secondary | ICD-10-CM

## 2021-07-30 DIAGNOSIS — Z1329 Encounter for screening for other suspected endocrine disorder: Secondary | ICD-10-CM

## 2021-07-30 NOTE — Progress Notes (Unsigned)
Was not able to  get labs on patient . Patient will need to get labs drawn at [facility]

## 2021-07-30 NOTE — Telephone Encounter (Signed)
Patient was call to come pick up paper for [facility]. I have left VM omn machine.April Byrd

## 2021-08-10 LAB — CBC
Hematocrit: 39.4 % (ref 34.0–46.6)
Hemoglobin: 12.7 g/dL (ref 11.1–15.9)
MCH: 26.3 pg — ABNORMAL LOW (ref 26.6–33.0)
MCHC: 32.2 g/dL (ref 31.5–35.7)
MCV: 82 fL (ref 79–97)
Platelets: 189 10*3/uL (ref 150–450)
RBC: 4.82 x10E6/uL (ref 3.77–5.28)
RDW: 14.4 % (ref 11.7–15.4)
WBC: 4.9 10*3/uL (ref 3.4–10.8)

## 2021-08-10 LAB — LIPID PANEL
Chol/HDL Ratio: 3 ratio (ref 0.0–4.4)
Cholesterol, Total: 161 mg/dL (ref 100–199)
HDL: 53 mg/dL (ref >39)
LDL Chol Calc (NIH): 89 mg/dL (ref 0–99)
Triglycerides: 103 mg/dL (ref 0–149)
VLDL Cholesterol Cal: 19 mg/dL (ref 5–40)

## 2021-08-10 LAB — HEPATIC FUNCTION PANEL
ALT: 14 IU/L (ref 0–32)
AST: 19 IU/L (ref 0–40)
Albumin: 3.9 g/dL (ref 3.9–4.9)
Alkaline Phosphatase: 95 IU/L (ref 44–121)
Bilirubin Total: 0.8 mg/dL (ref 0.0–1.2)
Bilirubin, Direct: 0.18 mg/dL (ref 0.00–0.40)
Total Protein: 7.4 g/dL (ref 6.0–8.5)

## 2021-08-10 LAB — TSH: TSH: 1.34 u[IU]/mL (ref 0.450–4.500)

## 2021-08-10 LAB — HEPATITIS C ANTIBODY: Hep C Virus Ab: NONREACTIVE

## 2021-08-20 ENCOUNTER — Encounter: Payer: Medicaid Other | Admitting: Family

## 2021-09-30 ENCOUNTER — Other Ambulatory Visit: Payer: Self-pay | Admitting: Family

## 2021-09-30 ENCOUNTER — Other Ambulatory Visit: Payer: Self-pay

## 2021-09-30 DIAGNOSIS — R809 Proteinuria, unspecified: Secondary | ICD-10-CM

## 2021-09-30 DIAGNOSIS — Z794 Long term (current) use of insulin: Secondary | ICD-10-CM

## 2021-09-30 MED ORDER — LOSARTAN POTASSIUM 50 MG PO TABS: 50.0000 mg | ORAL_TABLET | Freq: Every day | ORAL | 0 refills | Status: DC

## 2021-10-20 ENCOUNTER — Ambulatory Visit: Payer: Medicaid Other | Admitting: Family

## 2021-10-30 ENCOUNTER — Other Ambulatory Visit: Payer: Self-pay | Admitting: Family

## 2021-10-30 DIAGNOSIS — Z794 Long term (current) use of insulin: Secondary | ICD-10-CM

## 2021-11-01 NOTE — Telephone Encounter (Signed)
Last APE 05/28/21.  Requested Prescriptions  Pending Prescriptions Disp Refills  . LANTUS SOLOSTAR 100 UNIT/ML Solostar Pen [Pharmacy Med Name: Lantus SoloStar 100 UNIT/ML Subcutaneous Solution Pen-injector] 15 mL 0    Sig: INJECT 24 UNITS SUBCUTANEOUS DAILY AT BEDTIME     Endocrinology:  Diabetes - Insulins Failed - 10/30/2021 11:48 AM      Failed - HBA1C is between 0 and 7.9 and within 180 days    Hemoglobin A1C  Date Value Ref Range Status  02/16/2021 9.8 (A) 4.0 - 5.6 % Final   HbA1c, POC (controlled diabetic range)  Date Value Ref Range Status  11/13/2020 8.6 (A) 0.0 - 7.0 % Final         Failed - Valid encounter within last 6 months    Recent Outpatient Visits          5 months ago Annual physical exam   Primary Care at [facility], Amy J, NP   6 months ago Type 2 diabetes mellitus with diabetic mononeuropathy, with long-term current use of insulin [facility])   [facility] Care at [facility], Amy J, NP   8 months ago Type 2 diabetes mellitus with diabetic mononeuropathy, with long-term current use of insulin [facility])   Primary Care at [facility], Amy J, NP   10 months ago Type 2 diabetes mellitus with diabetic mononeuropathy, with long-term current use of insulin [facility])   Primary Care at [facility], Amy J, NP   11 months ago Type 2 diabetes mellitus with diabetic mononeuropathy, with long-term current use of insulin [facility])   Primary Care at [facility], Flonnie Hailstone, NP

## 2021-11-05 ENCOUNTER — Other Ambulatory Visit: Payer: Self-pay | Admitting: Family

## 2021-11-05 DIAGNOSIS — E1142 Type 2 diabetes mellitus with diabetic polyneuropathy: Secondary | ICD-10-CM

## 2021-11-05 NOTE — Telephone Encounter (Signed)
Requested Prescriptions  Pending Prescriptions Disp Refills  . gabapentin (NEURONTIN) 300 MG capsule [Pharmacy Med Name: Gabapentin 300 MG Oral Capsule] 90 capsule 2    Sig: Take 1 capsule by mouth at bedtime     Neurology: Anticonvulsants - gabapentin Passed - 11/05/2021 12:07 PM      Passed - Cr in normal range and within 360 days    Creatinine, Ser  Date Value Ref Range Status  02/16/2021 0.78 0.57 - 1.00 mg/dL Final         Passed - Completed PHQ-2 or PHQ-9 in the last 360 days      Passed - Valid encounter within last 12 months    Recent Outpatient Visits          5 months ago Annual physical exam   [facility] at [facility], Amy J, NP   6 months ago Type 2 diabetes mellitus with diabetic mononeuropathy, with long-term current use of insulin [facility])   Primary Care at [facility], Amy J, NP   8 months ago Type 2 diabetes mellitus with diabetic mononeuropathy, with long-term current use of insulin [facility])   Primary Care at [facility], Amy J, NP   11 months ago Type 2 diabetes mellitus with diabetic mononeuropathy, with long-term current use of insulin [facility])   Primary Care at [facility], Amy J, NP   11 months ago Type 2 diabetes mellitus with diabetic mononeuropathy, with long-term current use of insulin [facility])   Primary Care at [facility], Flonnie Hailstone, NP

## 2021-11-22 ENCOUNTER — Ambulatory Visit (INDEPENDENT_AMBULATORY_CARE_PROVIDER_SITE_OTHER): Payer: Commercial Managed Care - HMO | Admitting: Internal Medicine

## 2021-11-22 ENCOUNTER — Encounter: Payer: Self-pay | Admitting: Internal Medicine

## 2021-11-22 VITALS — BP 124/70 | HR 94 | Ht 64.02 in | Wt 216.0 lb

## 2021-11-22 DIAGNOSIS — Z794 Long term (current) use of insulin: Secondary | ICD-10-CM

## 2021-11-22 DIAGNOSIS — E1141 Type 2 diabetes mellitus with diabetic mononeuropathy: Secondary | ICD-10-CM

## 2021-11-22 DIAGNOSIS — E119 Type 2 diabetes mellitus without complications: Secondary | ICD-10-CM

## 2021-11-22 LAB — POCT GLYCOSYLATED HEMOGLOBIN (HGB A1C): Hemoglobin A1C: 6.4 % — AB (ref 4.0–5.6)

## 2021-11-22 LAB — POCT GLUCOSE (DEVICE FOR HOME USE): POC Glucose: 92 mg/dl (ref 70–99)

## 2021-11-22 MED ORDER — GLIMEPIRIDE 2 MG PO TABS: 2.0000 mg | ORAL_TABLET | Freq: Every day | ORAL | 3 refills | Status: DC

## 2021-11-22 MED ORDER — METFORMIN HCL 500 MG PO TABS: 1000.0000 mg | ORAL_TABLET | Freq: Every day | ORAL | 3 refills | Status: DC

## 2021-11-22 MED ORDER — FREESTYLE LIBRE 2 SENSOR MISC: 1.0000 | 3 refills | Status: DC

## 2021-11-22 MED ORDER — TRULICITY 1.5 MG/0.5ML ~~LOC~~ SOAJ: 1.5000 mg | SUBCUTANEOUS | 3 refills | Status: DC

## 2021-11-22 NOTE — Progress Notes (Unsigned)
Name: April Byrd  MRN/ DOB: [id number], 13-Jul-1975   Age/ Sex: 46 y.o., female    PCP: Camillia Herter, NP   Reason for Endocrinology Evaluation: Type 2 Diabetes Mellitus     Date of Initial Endocrinology Visit: 11/22/2021     PATIENT IDENTIFIER: April Byrd is a 46 y.o. female with a past medical history of DM . The patient presented for initial endocrinology clinic visit on 11/22/2021 for consultative assistance with her diabetes management.    HPI: Ms. Droll was    Diagnosed with DM > 16 yrs ago  Prior Medications tried/Intolerance: Started on Insulin 2022 has been off insulin 07/2021. Started Ozempic 2023 Currently checking blood sugars multiple  x a day through freestyle libre  Hypoglycemia episodes : yes               Symptoms: yes                 Frequency: rare  Hemoglobin A1c has ranged from 9.8% in 2023, peaking at 12.8% in 2020.   In terms of diet, the patient eats 1 meal a day, and snacks 2x a day . Avoids sugar -sweetened beverages   Has tingling of feet occasionally    HOME DIABETES REGIMEN: Metformin 500 mg 2 tabs daily  Glimepiride 4 mg daily  Ozempic 0.25 mg weekly ( Sundays)   Statin: no ACE-I/ARB: yes    METER DOWNLOAD SUMMARY:did not bring    DIABETIC COMPLICATIONS: Microvascular complications:  Neuropathy Denies: CKD Last eye exam: Completed 2022 Q 2 yrs   Macrovascular complications:   Denies: CAD, PVD, CVA   PAST HISTORY: Past Medical History:  Past Medical History:  Diagnosis Date   Asthma    sports induced   BMI 37.0-37.9,adult 04/26/2012   Calculus of gallbladder with acute cholecystitis 04/26/2012   Calculus of gallbladder with acute cholecystitis, without mention of obstruction 04/26/2012   Type II or unspecified type diabetes mellitus without mention of complication, not stated as uncontrolled 04/26/2012   Unspecified asthma(493.90) 04/26/2012   Unspecified essential hypertension 04/26/2012   Past Surgical History:  Past  Surgical History:  Procedure Laterality Date   CESAREAN SECTION     x 3   CHOLECYSTECTOMY N/A 04/25/2012   Procedure: LAPAROSCOPIC CHOLECYSTECTOMY WITH INTRAOPERATIVE CHOLANGIOGRAM;  Surgeon: Edward Jolly, MD;  Location: [facility];  Service: General;  Laterality: N/A;   hydrocephalus surgery     I & D EXTREMITY Right 01/06/2021   Procedure: IRRIGATION AND DEBRIDEMENT OF INDEX FINGER;  Surgeon: Iran Planas, MD;  Location: [place];  Service: Orthopedics;  Laterality: Right;   TUBAL LIGATION      Social History:  reports that she has never smoked. She has never been exposed to tobacco smoke. She has never used smokeless tobacco. She reports current alcohol use. She reports that she does not use drugs. Family History:  Family History  Problem Relation Age of Onset   Diabetes Mother    Hypertension Mother    Kidney disease Father    Healthy Brother    Healthy Son    Healthy Son    Healthy Son      HOME MEDICATIONS: Allergies as of 11/22/2021       Reactions   Penicillins Other (See Comments), Rash   Has patient had a PCN reaction causing immediate rash, facial/tongue/throat swelling, SOB or lightheadedness with hypotension: Unknown Has patient had a PCN reaction causing severe rash involving mucus membranes or skin necrosis: Unknown Has patient had  a PCN reaction that required hospitalization: Unknown Has patient had a PCN reaction occurring within the last 10 years: No If all of the above answers are "NO", then may proceed with Cephalosporin use. Has patient had a PCN reaction causing immediate rash, facial/tongue/throat swelling, SOB or lightheadedness with hypotension: Unknown Has patient had a PCN reaction causing severe rash involving mucus membranes or skin necrosis: Unknown Has patient had a PCN reaction that required hospitalization: Unknown Has patient had a PCN reaction occurring within the last 10 years: No If all of the above answers are "NO", then may proceed with  Cephalosporin use.        Medication List        Accurate as of November 22, 2021  9:39 AM. If you have any questions, ask your nurse or doctor.          STOP taking these medications    Lantus SoloStar 100 UNIT/ML Solostar Pen Generic drug: insulin glargine Stopped by: Dorita Sciara, MD       TAKE these medications    Accu-Chek Aviva Plus w/Device Kit 1 each by Does not apply route 3 (three) times daily. Use to check blood sugars 3 times daily. UXL24.40   Accu-Chek FastClix Lancets Misc USE AS DIRECTED ONCE DAILY TO  TEST  BLOOD  SUGAR DXE11.65   Accu-Chek Guide test strip Generic drug: glucose blood Test 3 times daily DxE11.65   bacitracin ointment Apply 1 application topically 3 (three) times daily. Apply to nail bed   FreeStyle Libre 2 Reader [facility] 1 each by Other route 4 (four) times daily -  before meals and at bedtime.   FreeStyle Libre 2 Sensor Misc 1 each by Other route 4 (four) times daily -  before meals and at bedtime. USE AS DIRECTED   gabapentin 300 MG capsule Commonly known as: NEURONTIN Take 1 capsule by mouth at bedtime   glimepiride 4 MG tablet Commonly known as: AMARYL Take 2 tablets (8 mg total) by mouth daily before breakfast. Must eat before taking medication   losartan 50 MG tablet Commonly known as: COZAAR Take 1 tablet (50 mg total) by mouth daily.   metFORMIN 500 MG tablet Commonly known as: GLUCOPHAGE Take 2 tablets (1,000 mg total) by mouth 2 (two) times daily with a meal. What changed: when to take this   Pen Needles 31G X 8 MM Misc UAD   Semaglutide(0.25 or 0.5MG/DOS) 2 MG/1.5ML Sopn Inject 0.25 mg into the skin once a week.         ALLERGIES: Allergies  Allergen Reactions   Penicillins Other (See Comments) and Rash    Has patient had a PCN reaction causing immediate rash, facial/tongue/throat swelling, SOB or lightheadedness with hypotension: Unknown Has patient had a PCN reaction causing severe rash  involving mucus membranes or skin necrosis: Unknown Has patient had a PCN reaction that required hospitalization: Unknown Has patient had a PCN reaction occurring within the last 10 years: No If all of the above answers are "NO", then may proceed with Cephalosporin use.  Has patient had a PCN reaction causing immediate rash, facial/tongue/throat swelling, SOB or lightheadedness with hypotension: Unknown Has patient had a PCN reaction causing severe rash involving mucus membranes or skin necrosis: Unknown Has patient had a PCN reaction that required hospitalization: Unknown Has patient had a PCN reaction occurring within the last 10 years: No If all of the above answers are "NO", then may proceed with Cephalosporin use.     REVIEW OF  SYSTEMS: A comprehensive ROS was conducted with the patient and is negative except as per HPI and below:  Review of Systems  Gastrointestinal:  Negative for diarrhea, nausea and vomiting.  Neurological:  Positive for tingling.      OBJECTIVE:   VITAL SIGNS: BP 124/70 (BP Location: Left Arm, Patient Position: Sitting, Cuff Size: Large)   Pulse 94   Ht 5' 4.02" (1.626 m)   Wt 216 lb (98 kg)   SpO2 96%   BMI 37.05 kg/m    PHYSICAL EXAM:  General: Pt appears well and is in NAD  Neck: General: Supple without adenopathy or carotid bruits. Thyroid: Thyroid size normal.  No goiter or nodules appreciated.   Lungs: Clear with good BS bilat with no rales, rhonchi, or wheezes  Heart: RRR   Abdomen:  soft, nontender  Extremities:  Lower extremities - No pretibial edema. No lesions.  Neuro: MS is good with appropriate affect, pt is alert and Ox3    DM foot exam: 11/22/2021   The skin of the feet is intact without sores or ulcerations. The pedal pulses are 2+ on right and 2+ on left. The sensation is intact to a screening 5.07, 10 gram monofilament bilaterally  DATA REVIEWED:  Lab Results  Component Value Date   HGBA1C 6.4 (A) 11/22/2021   HGBA1C  9.8 (A) 02/16/2021   HGBA1C 8.6 (A) 11/13/2020    Latest Reference Range & Units 08/09/21 09:24  Alkaline Phosphatase 44 - 121 IU/L 95  Albumin 3.9 - 4.9 g/dL 3.9  AST 0 - 40 IU/L 19  ALT 0 - 32 IU/L 14  Total Protein 6.0 - 8.5 g/dL 7.4  Total Bilirubin 0.0 - 1.2 mg/dL 0.8  BILIRUBIN, DIRECT 0.00 - 0.40 mg/dL 0.18  Total CHOL/HDL Ratio 0.0 - 4.4 ratio 3.0  Cholesterol, Total 100 - 199 mg/dL 161  HDL Cholesterol >39 mg/dL 53  Triglycerides 0 - 149 mg/dL 103  VLDL Cholesterol Cal 5 - 40 mg/dL 19  LDL Chol Calc (NIH) 0 - 99 mg/dL 89    ASSESSMENT / PLAN / RECOMMENDATIONS:   1) Type 2 Diabetes Mellitus, Optimally controlled, With*** complications - Most recent A1c of 6.4 %. Goal A1c < 7.0 %.    Plan: GENERAL: ***  MEDICATIONS: ***  EDUCATION / INSTRUCTIONS: BG monitoring instructions: Patient is instructed to check her blood sugars *** times a day, ***. Call [facility] if: BG persistently < 70  I reviewed the Rule of 15 for the treatment of hypoglycemia in detail with the patient. Literature supplied.   2) Diabetic complications:  Eye: Does not have known diabetic retinopathy.  Neuro/ Feet: Does  have known diabetic peripheral neuropathy. Renal: Patient does not have known baseline CKD. She is not on an ACEI/ARB at present.        Signed electronically by: Mack Guise, MD  [facility]  [facility] [address] [place], [place] [address] Phone: [phone] FAX: [phone]   CC: Camillia Herter, NP [place] [address] Phone: [phone]  Fax: [phone]    Return to Endocrinology clinic as below: No future appointments.

## 2021-11-22 NOTE — Patient Instructions (Signed)
Continue Metformin 500 mg , 2 tablets daily  Decrease Glimepiride 2 mg daily  Switch Ozempic to Trulicity 1.5 mg once weekly      HOW TO TREAT LOW BLOOD SUGARS (Blood sugar LESS THAN 70 MG/DL) Please follow the RULE OF 15 for the treatment of hypoglycemia treatment (when your (blood sugars are less than 70 mg/dL)   STEP 1: Take 15 grams of carbohydrates when your blood sugar is low, which includes:  3-4 GLUCOSE TABS  OR 3-4 OZ OF JUICE OR REGULAR SODA OR ONE TUBE OF GLUCOSE GEL    STEP 2: RECHECK blood sugar in 15 MINUTES STEP 3: If your blood sugar is still low at the 15 minute recheck --> then, go back to STEP 1 and treat AGAIN with another 15 grams of carbohydrates.

## 2021-12-27 ENCOUNTER — Telehealth: Payer: Self-pay

## 2021-12-27 NOTE — Telephone Encounter (Signed)
Att to contact pt to verify what was the reason she needed blood work and urinalysis, so we can get an appt scheduled for further evaluation  No ans lvm to return call to schedule appt or send MyChart msg

## 2021-12-27 NOTE — Telephone Encounter (Signed)
Copied from CRM [id number]. Topic: General - Other >> Dec 24, 2021  4:43 PM [place] B wrote: Reason for CRM: Patient called in to have labs and urinalysis done.

## 2022-01-23 ENCOUNTER — Encounter: Payer: Self-pay | Admitting: Internal Medicine

## 2022-02-02 ENCOUNTER — Other Ambulatory Visit (HOSPITAL_COMMUNITY): Payer: Self-pay

## 2022-02-02 NOTE — Telephone Encounter (Signed)
After running a test claim, it appears there is a PA on file already that is in effective until 01-24-23. Thank you.

## 2022-02-24 ENCOUNTER — Telehealth: Payer: Commercial Managed Care - HMO | Admitting: Physician Assistant

## 2022-02-24 DIAGNOSIS — R102 Pelvic and perineal pain: Secondary | ICD-10-CM

## 2022-02-24 NOTE — Progress Notes (Signed)
Message sent to patient requesting further input regarding current symptoms. Awaiting patient response.  

## 2022-02-24 NOTE — Progress Notes (Signed)
Because of lack of urinary symptoms and just the cramping, I feel your condition warrants further evaluation and I recommend that you be seen in a face to face visit, so that you can get an exam and urine testing to confirm if a UTI is present of any concern for start of pelvic issue.    NOTE: There will be NO CHARGE for this eVisit   If you are having a true medical emergency please call 911.      For an urgent face to face visit, [place] has eight urgent care centers for your convenience:   NEW!! [facility] [place] [facility] [place] Get Driving Directions [phone] [address] [place], [place] [facility] [place] [facility] [place] Get Driving Directions [phone] [place], [place] [address]   [place] [facility] [place] [facility]) Get Driving Directions [phone] [address], [place] [address]  [facility]) Get Driving Directions [phone] [address] [place],  [place]  [address]  [place] [facility] [place] [facility] - at [address] Get Driving Directions  [phone] [address] [place],  [place] [address]   [facility] Get Driving Directions [phone] [place], [place], [place] [address]   [place] [facility] Get Driving Directions  [phone] [address] [place], [place] [address]   [facility] [place] Get Driving Directions [phone] [address], [place], [place] [address]  Your MyChart E-visit questionnaire answers were reviewed by a board certified advanced clinical practitioner to complete your personal care plan based on your specific symptoms.  Thank you for using e-Visits.

## 2022-02-28 ENCOUNTER — Encounter: Payer: Self-pay | Admitting: Family

## 2022-02-28 NOTE — Progress Notes (Signed)
Erroneous encounter-disregard

## 2022-03-05 ENCOUNTER — Telehealth: Payer: Self-pay | Admitting: Nurse Practitioner

## 2022-03-05 ENCOUNTER — Encounter: Payer: Self-pay | Admitting: Nurse Practitioner

## 2022-03-05 DIAGNOSIS — K0889 Other specified disorders of teeth and supporting structures: Secondary | ICD-10-CM

## 2022-03-05 MED ORDER — NAPROXEN 500 MG PO TABS: 500.0000 mg | ORAL_TABLET | Freq: Two times a day (BID) | ORAL | 0 refills | Status: DC

## 2022-03-05 MED ORDER — CLINDAMYCIN HCL 300 MG PO CAPS: 300.0000 mg | ORAL_CAPSULE | Freq: Three times a day (TID) | ORAL | 0 refills | Status: DC

## 2022-03-05 NOTE — Progress Notes (Signed)
E-Visit for Dental Pain  We are sorry that you are not feeling well.  Here is how we plan to help!  Based on what you have shared with me in the questionnaire, it sounds like you have dental pain  Clindamycin 379m 3 times a day for 7 days and Naprosyn 5025m2 times a day for 7 days for discomfort  It is imperative that you see a dentist within 10 days of this eVisit to determine the cause of the dental pain and be sure it is adequately treated  A toothache or tooth pain is caused when the nerve in the root of a tooth or surrounding a tooth is irritated. Dental (tooth) infection, decay, injury, or loss of a tooth are the most common causes of dental pain. Pain may also occur after an extraction (tooth is pulled out). Pain sometimes originates from other areas and radiates to the jaw, thus appearing to be tooth pain.Bacteria growing inside your mouth can contribute to gum disease and dental decay, both of which can cause pain. A toothache occurs from inflammation of the central portion of the tooth called pulp. The pulp contains nerve endings that are very sensitive to pain. Inflammation to the pulp or pulpitis may be caused by dental cavities, trauma, and infection.    HOME CARE:   For toothaches: Over-the-counter pain medications such as acetaminophen or ibuprofen may be used. Take these as directed on the package while you arrange for a dental appointment. Avoid very cold or hot foods, because they may make the pain worse. You may get relief from biting on a cotton ball soaked in oil of cloves. You can get oil of cloves at most drug stores.  For jaw pain:  Aspirin may be helpful for problems in the joint of the jaw in adults. If pain happens every time you open your mouth widely, the temporomandibular joint (TMJ) may be the source of the pain. Yawning or taking a large bite of food may worsen the pain. An appointment with your doctor or dentist will help you find the cause.     GET HELP  RIGHT AWAY IF:  You have a high fever or chills If you have had a recent head or face injury and develop headache, light headedness, nausea, vomiting, or other symptoms that concern you after an injury to your face or mouth, you could have a more serious injury in addition to your dental injury. A facial rash associated with a toothache: This condition may improve with medication. Contact your doctor for them to decide what is appropriate. Any jaw pain occurring with chest pain: Although jaw pain is most commonly caused by dental disease, it is sometimes referred pain from other areas. People with heart disease, especially people who have had stents placed, people with diabetes, or those who have had heart surgery may have jaw pain as a symptom of heart attack or angina. If your jaw or tooth pain is associated with lightheadedness, sweating, or shortness of breath, you should see a doctor as soon as possible. Trouble swallowing or excessive pain or bleeding from gums: If you have a history of a weakened immune system, diabetes, or steroid use, you may be more susceptible to infections. Infections can often be more severe and extensive or caused by unusual organisms. Dental and gum infections in people with these conditions may require more aggressive treatment. An abscess may need draining or IV antibiotics, for example.  MAKE SURE YOU   Understand these instructions.  Will watch your condition. Will get help right away if you are not doing well or get worse.  Thank you for choosing an e-visit.  Your e-visit answers were reviewed by a board certified advanced clinical practitioner to complete your personal care plan. Depending upon the condition, your plan could have included both over the counter or prescription medications.  Please review your pharmacy choice. Make sure the pharmacy is open so you can pick up prescription now. If there is a problem, you may contact your provider through [employer] and have the prescription routed to another pharmacy.  Your safety is important to [place]. If you have drug allergies check your prescription carefully.   For the next 24 hours you can use MyChart to ask questions about today's visit, request a non-urgent call back, or ask for a work or school excuse. You will get an email in the next two days asking about your experience. I hope that your e-visit has been valuable and will speed your recovery  Mary-Margaret Hassell Done, FNP   5-10 minutes spent reviewing and documenting in chart.

## 2022-03-07 NOTE — Progress Notes (Signed)
Requesting alternate pain medication from a previous evisit  on 03-05-2022 with another provider

## 2022-03-07 NOTE — Progress Notes (Signed)
I have spent 5 minutes in review of e-visit questionnaire, review and updating patient chart, medical decision making and response to patient.  ° °Grey Schlauch W Kiondra Caicedo, NP ° °  °

## 2022-03-11 ENCOUNTER — Telehealth: Payer: Self-pay

## 2022-03-11 MED ORDER — SEMAGLUTIDE(0.25 OR 0.5MG/DOS) 2 MG/3ML ~~LOC~~ SOPN: 0.5000 mg | PEN_INJECTOR | SUBCUTANEOUS | 3 refills | Status: DC

## 2022-03-11 NOTE — Telephone Encounter (Signed)
Pt called to request a rx for Ozempic be sent to the pharmacy. Pt advised she was on the 0.5 mg dose of Ozempic.

## 2022-05-24 ENCOUNTER — Encounter: Payer: Self-pay | Admitting: Internal Medicine

## 2022-05-24 ENCOUNTER — Ambulatory Visit: Payer: BLUE CROSS/BLUE SHIELD | Admitting: Internal Medicine

## 2022-05-24 VITALS — BP 126/80 | HR 96 | Ht 64.02 in | Wt 206.0 lb

## 2022-05-24 DIAGNOSIS — Z794 Long term (current) use of insulin: Secondary | ICD-10-CM

## 2022-05-24 DIAGNOSIS — E1141 Type 2 diabetes mellitus with diabetic mononeuropathy: Secondary | ICD-10-CM | POA: Diagnosis not present

## 2022-05-24 LAB — POCT GLUCOSE (DEVICE FOR HOME USE): POC Glucose: 123 mg/dl — AB (ref 70–99)

## 2022-05-24 LAB — POCT GLYCOSYLATED HEMOGLOBIN (HGB A1C): Hemoglobin A1C: 6.8 % — AB (ref 4.0–5.6)

## 2022-05-24 MED ORDER — OZEMPIC (1 MG/DOSE) 4 MG/3ML ~~LOC~~ SOPN: 1.0000 mg | PEN_INJECTOR | SUBCUTANEOUS | 3 refills | Status: DC

## 2022-05-24 MED ORDER — METFORMIN HCL 500 MG PO TABS
1000.0000 mg | ORAL_TABLET | Freq: Every day | ORAL | 3 refills | Status: DC
Start: 2022-05-24 — End: 2023-07-18

## 2022-05-24 MED ORDER — FREESTYLE LIBRE 2 SENSOR MISC: 1.0000 | 3 refills | Status: DC

## 2022-05-24 NOTE — Progress Notes (Signed)
Name: April Byrd  MRN/ DOB: [id number], 1975-07-17   Age/ Sex: 47 y.o., female    PCP: Rema Fendt, NP   Reason for Endocrinology Evaluation: Type 2 Diabetes Mellitus     Date of Initial Endocrinology Visit: 11/22/2021    PATIENT IDENTIFIER: April Byrd is a 47 y.o. female with a past medical history of DM . The patient presented for initial endocrinology clinic visit on 11/22/2021 for consultative assistance with her diabetes management.    HPI: April Byrd was    Diagnosed with DM > 16 yrs ago  Prior Medications tried/Intolerance: Started on Insulin 2022 has been off insulin 07/2021. Started Ozempic 2023 Hemoglobin A1c has ranged from 9.8% in 2023, peaking at 12.8% in 2020.    On her initial visit to our clinic she had an A1c of 6.4%, she was on metformin, glimepiride, and Ozempic, we switched Ozempic to Trulicity due to insurance requirement  SUBJECTIVE:   During the last visit (11/22/2021): A1c 6.4%  Today (05/24/22): April Byrd is here for follow-up on diabetes management.  She checks her blood sugars 0 times daily. She has the freestyle libre 2 but has not been using it. Needs a refill   She became a new grandmother recently with a baby boy  Feet tingling  is improving  Denies nausea, vomiting  Has once daily loose stools due to Metformin      HOME DIABETES REGIMEN: Metformin 500 mg 2 tabs daily  Glimepiride 2 mg daily  Ozempic 0.5 mg weekly    Statin: no ACE-I/ARB: yes    METER DOWNLOAD SUMMARY:did not bring    DIABETIC COMPLICATIONS: Microvascular complications:  Neuropathy Denies: CKD Last eye exam: Completed 2022 Q 2 yrs   Macrovascular complications:   Denies: CAD, PVD, CVA   PAST HISTORY: Past Medical History:  Past Medical History:  Diagnosis Date   Asthma    sports induced   BMI 37.0-37.9,adult 04/26/2012   Calculus of gallbladder with acute cholecystitis 04/26/2012   Calculus of gallbladder with acute cholecystitis,  without mention of obstruction 04/26/2012   Type II or unspecified type diabetes mellitus without mention of complication, not stated as uncontrolled 04/26/2012   Unspecified asthma(493.90) 04/26/2012   Unspecified essential hypertension 04/26/2012   Past Surgical History:  Past Surgical History:  Procedure Laterality Date   CESAREAN SECTION     x 3   CHOLECYSTECTOMY N/A 04/25/2012   Procedure: LAPAROSCOPIC CHOLECYSTECTOMY WITH INTRAOPERATIVE CHOLANGIOGRAM;  Surgeon: Mariella Saa, MD;  Location: [facility];  Service: General;  Laterality: N/A;   hydrocephalus surgery     I & D EXTREMITY Right 01/06/2021   Procedure: IRRIGATION AND DEBRIDEMENT OF INDEX FINGER;  Surgeon: Bradly Bienenstock, MD;  Location: [facility];  Service: Orthopedics;  Laterality: Right;   TUBAL LIGATION      Social History:  reports that she has never smoked. She has never been exposed to tobacco smoke. She has never used smokeless tobacco. She reports current alcohol use. She reports that she does not use drugs. Family History:  Family History  Problem Relation Age of Onset   Diabetes Mother    Hypertension Mother    Kidney disease Father    Healthy Brother    Healthy Son    Healthy Son    Healthy Son      HOME MEDICATIONS: Allergies as of 05/24/2022       Reactions   Penicillins Other (See Comments), Rash   Has patient had a PCN reaction causing immediate  rash, facial/tongue/throat swelling, SOB or lightheadedness with hypotension: Unknown Has patient had a PCN reaction causing severe rash involving mucus membranes or skin necrosis: Unknown Has patient had a PCN reaction that required hospitalization: Unknown Has patient had a PCN reaction occurring within the last 10 years: No If all of the above answers are "NO", then may proceed with Cephalosporin use. Has patient had a PCN reaction causing immediate rash, facial/tongue/throat swelling, SOB or lightheadedness with hypotension: Unknown Has patient had a PCN reaction  causing severe rash involving mucus membranes or skin necrosis: Unknown Has patient had a PCN reaction that required hospitalization: Unknown Has patient had a PCN reaction occurring within the last 10 years: No If all of the above answers are "NO", then may proceed with Cephalosporin use.        Medication List        Accurate as of May 24, 2022  9:30 AM. If you have any questions, ask your nurse or doctor.          STOP taking these medications    bacitracin ointment Stopped by: Scarlette Shorts, MD   clindamycin 300 MG capsule Commonly known as: Cleocin Stopped by: Scarlette Shorts, MD   glimepiride 2 MG tablet Commonly known as: AMARYL Stopped by: Scarlette Shorts, MD   Pen Needles 31G X 8 MM Misc Stopped by: Scarlette Shorts, MD       TAKE these medications    Accu-Chek Aviva Plus w/Device Kit 1 each by Does not apply route 3 (three) times daily. Use to check blood sugars 3 times daily. FAO13.08   Accu-Chek FastClix Lancets Misc USE AS DIRECTED ONCE DAILY TO  TEST  BLOOD  SUGAR DXE11.65   Accu-Chek Guide test strip Generic drug: glucose blood Test 3 times daily DxE11.65   FreeStyle Libre 2 Sensor Misc 1 each by Other route every 14 (fourteen) days. USE AS DIRECTED   gabapentin 300 MG capsule Commonly known as: NEURONTIN Take 1 capsule by mouth at bedtime   losartan 50 MG tablet Commonly known as: COZAAR Take 1 tablet (50 mg total) by mouth daily.   metFORMIN 500 MG tablet Commonly known as: GLUCOPHAGE Take 2 tablets (1,000 mg total) by mouth daily with breakfast.   naproxen 500 MG tablet Commonly known as: Naprosyn Take 1 tablet (500 mg total) by mouth 2 (two) times daily with a meal.   Semaglutide(0.25 or 0.5MG /DOS) 2 MG/3ML Sopn Inject 0.5 mg into the skin once a week.         ALLERGIES: Allergies  Allergen Reactions   Penicillins Other (See Comments) and Rash    Has patient had a PCN reaction causing immediate  rash, facial/tongue/throat swelling, SOB or lightheadedness with hypotension: Unknown Has patient had a PCN reaction causing severe rash involving mucus membranes or skin necrosis: Unknown Has patient had a PCN reaction that required hospitalization: Unknown Has patient had a PCN reaction occurring within the last 10 years: No If all of the above answers are "NO", then may proceed with Cephalosporin use.  Has patient had a PCN reaction causing immediate rash, facial/tongue/throat swelling, SOB or lightheadedness with hypotension: Unknown Has patient had a PCN reaction causing severe rash involving mucus membranes or skin necrosis: Unknown Has patient had a PCN reaction that required hospitalization: Unknown Has patient had a PCN reaction occurring within the last 10 years: No If all of the above answers are "NO", then may proceed with Cephalosporin use.     REVIEW OF  SYSTEMS: A comprehensive ROS was conducted with the patient and is negative except as per HPI     OBJECTIVE:   VITAL SIGNS: BP 126/80 (BP Location: Left Arm, Patient Position: Sitting, Cuff Size: Large)   Pulse 96   Ht 5' 4.02" (1.626 m)   Wt 206 lb (93.4 kg)   SpO2 98%   BMI 35.34 kg/m    PHYSICAL EXAM:  General: Pt appears well and is in NAD  Neck: General: Supple without adenopathy or carotid bruits. Thyroid: Thyroid size normal.  No goiter or nodules appreciated.   Lungs: Clear with good BS bilat with no rales, rhonchi, or wheezes  Heart: RRR   Abdomen:  soft, nontender  Extremities:  Lower extremities - Trace pretibial edema  Neuro: MS is good with appropriate affect, pt is alert and Ox3    DM foot exam: 11/22/2021   The skin of the feet is intact without sores or ulcerations. The pedal pulses are 2+ on right and 2+ on left. The sensation is intact to a screening 5.07, 10 gram monofilament bilaterally  DATA REVIEWED:  Lab Results  Component Value Date   HGBA1C 6.8 (A) 05/24/2022   HGBA1C 6.4 (A)  11/22/2021   HGBA1C 9.8 (A) 02/16/2021    Latest Reference Range & Units 08/09/21 09:24  Alkaline Phosphatase 44 - 121 IU/L 95  Albumin 3.9 - 4.9 g/dL 3.9  AST 0 - 40 IU/L 19  ALT 0 - 32 IU/L 14  Total Protein 6.0 - 8.5 g/dL 7.4  Total Bilirubin 0.0 - 1.2 mg/dL 0.8  BILIRUBIN, DIRECT 0.00 - 0.40 mg/dL 1.61  Total CHOL/HDL Ratio 0.0 - 4.4 ratio 3.0  Cholesterol, Total 100 - 199 mg/dL 096  HDL Cholesterol >04 mg/dL 53  Triglycerides 0 - 540 mg/dL 981  VLDL Cholesterol Cal 5 - 40 mg/dL 19  LDL Chol Calc (NIH) 0 - 99 mg/dL 89    ASSESSMENT / PLAN / RECOMMENDATIONS:   1) Type 2 Diabetes Mellitus, Optimally controlled, With neuropathic  complications - Most recent A1c of 6.8 %. Goal A1c < 7.0 %.    - A1c continues to be at goal  - Encouraged glucose checks at home  - We discussed increasing Ozempic and stopping Glimepiride  - She is past due for labs , declined labs today as she needs to hydrate for 3 days prior to labs , she would like to have these done at PCP's    MEDICATIONS: Continue metformin 500 mg, 2 tabs daily Stop  glimepiride 2 mg daily Increase  Ozempic 1 mg weekly  EDUCATION / INSTRUCTIONS: BG monitoring instructions: Patient is instructed to check her blood sugars 3 times a day, before each meal. Call [facility] if: BG persistently < 70  I reviewed the Rule of 15 for the treatment of hypoglycemia in detail with the patient. Literature supplied.   2) Diabetic complications:  Eye: Does not have known diabetic retinopathy.  Neuro/ Feet: Does  have known diabetic peripheral neuropathy. Renal: Patient does not have known baseline CKD. She is not on an ACEI/ARB at present.      F/U in 6 months   Signed electronically by: Lyndle Herrlich, MD  [facility]  [facility] [address] [place] [place], [place] [address] Phone: [phone] FAX: [phone]   CC: Rema Fendt, NP [address] [place] [address] Phone: [phone]  Fax: [phone]    Return to Endocrinology clinic as below: No  future appointments.

## 2022-05-24 NOTE — Patient Instructions (Signed)
Stop Glimepiride  Continue Metformin 500 mg , 2 tablets daily  Increase Ozempic 1 mg weekly      HOW TO TREAT LOW BLOOD SUGARS (Blood sugar LESS THAN 70 MG/DL) Please follow the RULE OF 15 for the treatment of hypoglycemia treatment (when your (blood sugars are less than 70 mg/dL)   STEP 1: Take 15 grams of carbohydrates when your blood sugar is low, which includes:  3-4 GLUCOSE TABS  OR 3-4 OZ OF JUICE OR REGULAR SODA OR ONE TUBE OF GLUCOSE GEL    STEP 2: RECHECK blood sugar in 15 MINUTES STEP 3: If your blood sugar is still low at the 15 minute recheck --> then, go back to STEP 1 and treat AGAIN with another 15 grams of carbohydrates.

## 2022-06-28 ENCOUNTER — Ambulatory Visit
Admission: EM | Admit: 2022-06-28 | Discharge: 2022-06-28 | Disposition: A | Discharge status: Home / Self Care | Payer: BLUE CROSS/BLUE SHIELD | Attending: Family Medicine | Admitting: Family Medicine

## 2022-06-28 ENCOUNTER — Encounter: Payer: Self-pay | Admitting: Emergency Medicine

## 2022-06-28 ENCOUNTER — Other Ambulatory Visit: Payer: Self-pay

## 2022-06-28 DIAGNOSIS — M069 Rheumatoid arthritis, unspecified: Secondary | ICD-10-CM | POA: Diagnosis not present

## 2022-06-28 MED ORDER — PREDNISONE 20 MG PO TABS: ORAL_TABLET | ORAL | 0 refills | Status: DC

## 2022-06-28 NOTE — ED Provider Notes (Signed)
[facility]    CSN: [id number] Arrival date & time: 06/28/22  0946      History   Chief Complaint Chief Complaint  Patient presents with   Generalized Body Aches    HPI April Byrd is a 47 y.o. female.   HPI Here for joint pains in both wrists and her ankles.  She is also had swelling there.  Started bothering her about 3 or 4 days ago.  No fever and no upper respiratory symptoms.  She does have a history of rheumatoid arthritis, but has not been something that has bothered her persistently; therefore she has never seen a rheumatologist about it.  She does have a history of diabetes and her last A1c was 6.8.  Sugars have been doing well lately    Past Medical History:  Diagnosis Date   Asthma    sports induced   BMI 37.0-37.9,adult 04/26/2012   Calculus of gallbladder with acute cholecystitis 04/26/2012   Calculus of gallbladder with acute cholecystitis, without mention of obstruction 04/26/2012   Type II or unspecified type diabetes mellitus without mention of complication, not stated as uncontrolled 04/26/2012   Unspecified asthma(493.90) 04/26/2012   Unspecified essential hypertension 04/26/2012    Patient Active Problem List   Diagnosis Date Noted   Pain in right hand 02/25/2021   Microalbuminuria 06/29/2020   Elevated blood-pressure reading without diagnosis of hypertension 01/09/2020   Migraine without aura, not refractory 01/09/2020   Diabetes mellitus (HCC) 12/11/2018   Candidal vulvovaginitis 10/10/2018   Abnormal uterine bleeding 10/10/2018   Osteoarthritis of feet, bilateral 02/12/2018   Primary osteoarthritis of both knees 02/12/2018   Osteoarthritis of both hands 02/12/2018   Unspecified asthma(493.90) 04/26/2012   Obesity with body mass index 30 or greater 04/26/2012    Past Surgical History:  Procedure Laterality Date   CESAREAN SECTION     x 3   CHOLECYSTECTOMY N/A 04/25/2012   Procedure: LAPAROSCOPIC CHOLECYSTECTOMY WITH INTRAOPERATIVE  CHOLANGIOGRAM;  Surgeon: Mariella Saa, MD;  Location: [facility];  Service: General;  Laterality: N/A;   hydrocephalus surgery     I & D EXTREMITY Right 01/06/2021   Procedure: IRRIGATION AND DEBRIDEMENT OF INDEX FINGER;  Surgeon: Bradly Bienenstock, MD;  Location: [facility];  Service: Orthopedics;  Laterality: Right;   TUBAL LIGATION      OB History     Gravida  6   Para  3   Term  3   Preterm      AB  3   Living  3      SAB  3   IAB      Ectopic      Multiple      Live Births           Obstetric Comments  c-section x 3.           Home Medications    Prior to Admission medications   Medication Sig Start Date End Date Taking? Authorizing Provider  predniSONE (DELTASONE) 20 MG tablet 3 tabs daily x3 days, then 2 tabs daily x3 days, then 1 tab daily x3 days, then one half tab daily x3 days, then stop 06/28/22  Yes Maressa Apollo, Janace Aris, MD  Accu-Chek FastClix Lancets MISC USE AS DIRECTED ONCE DAILY TO  TEST  BLOOD  SUGAR DXE11.65 07/05/19   Fulp, Cammie, MD  Blood Glucose Monitoring Suppl (ACCU-CHEK AVIVA PLUS) w/Device KIT 1 each by Does not apply route 3 (three) times daily. Use to check blood sugars  3 times daily. ZOX09.60 04/16/21   Rema Fendt, NP  Continuous Glucose Sensor (FREESTYLE LIBRE 2 SENSOR) MISC 1 each by Other route every 14 (fourteen) days. USE AS DIRECTED 05/24/22   Shamleffer, Konrad Dolores, MD  gabapentin (NEURONTIN) 300 MG capsule Take 1 capsule by mouth at bedtime 11/05/21   Rema Fendt, NP  glucose blood (ACCU-CHEK GUIDE) test strip Test 3 times daily DxE11.65 12/10/20   Rema Fendt, NP  losartan (COZAAR) 50 MG tablet Take 1 tablet (50 mg total) by mouth daily. 09/30/21   Rema Fendt, NP  metFORMIN (GLUCOPHAGE) 500 MG tablet Take 2 tablets (1,000 mg total) by mouth daily with breakfast. 05/24/22   Shamleffer, Konrad Dolores, MD  naproxen (NAPROSYN) 500 MG tablet Take 1 tablet (500 mg total) by mouth 2 (two) times daily with a meal. 03/05/22    Daphine Deutscher, Mary-Margaret, FNP  Semaglutide, 1 MG/DOSE, (OZEMPIC, 1 MG/DOSE,) 4 MG/3ML SOPN Inject 1 mg into the skin once a week. 05/24/22   Shamleffer, Konrad Dolores, MD    Family History Family History  Problem Relation Age of Onset   Diabetes Mother    Hypertension Mother    Kidney disease Father    Healthy Brother    Healthy Son    Healthy Son    Healthy Son     Social History Social History   Tobacco Use   Smoking status: Never    Passive exposure: Never   Smokeless tobacco: Never  Vaping Use   Vaping Use: Never used  Substance Use Topics   Alcohol use: Yes    Comment: social    Drug use: No     Allergies   Penicillins   Review of Systems Review of Systems   Physical Exam Triage Vital Signs ED Triage Vitals [06/28/22 0954]  Enc Vitals Group     BP (!) 150/99     Pulse Rate 98     Resp 18     Temp 98.3 F (36.8 C)     Temp Source Oral     SpO2 98 %     Weight      Height      Head Circumference      Peak Flow      Pain Score 6     Pain Loc      Pain Edu?      Excl. in GC?    No data found.  Updated Vital Signs BP (!) 150/99 (BP Location: Left Arm)   Pulse 98   Temp 98.3 F (36.8 C) (Oral)   Resp 18   SpO2 98%   Visual Acuity Right Eye Distance:   Left Eye Distance:   Bilateral Distance:    Right Eye Near:   Left Eye Near:    Bilateral Near:     Physical Exam Vitals reviewed.  Constitutional:      General: She is not in acute distress.    Appearance: She is not ill-appearing, toxic-appearing or diaphoretic.  HENT:     Mouth/Throat:     Mouth: Mucous membranes are moist.  Eyes:     Extraocular Movements: Extraocular movements intact.     Conjunctiva/sclera: Conjunctivae normal.     Pupils: Pupils are equal, round, and reactive to light.  Cardiovascular:     Rate and Rhythm: Normal rate and regular rhythm.     Heart sounds: No murmur heard. Pulmonary:     Effort: Pulmonary effort is normal.     Breath sounds:  Normal  breath sounds.  Musculoskeletal:     Cervical back: Neck supple.     Comments: There is swelling and tenderness of her left wrist and on both hands overlying the first and second MCP joints.  There is also tenderness of both ankles.  Lymphadenopathy:     Cervical: No cervical adenopathy.  Skin:    Coloration: Skin is not jaundiced or pale.  Neurological:     General: No focal deficit present.     Mental Status: She is alert and oriented to person, place, and time.  Psychiatric:        Behavior: Behavior normal.      UC Treatments / Results  Labs (all labs ordered are listed, but only abnormal results are displayed) Labs Reviewed - No data to display  EKG   Radiology No results found.  Procedures Procedures (including critical care time)  Medications Ordered in UC Medications - No data to display  Initial Impression / Assessment and Plan / UC Course  I have reviewed the triage vital signs and the nursing notes.  Pertinent labs & imaging results that were available during my care of the patient were reviewed by me and considered in my medical decision making (see chart for details).       On review of the chart I can see a positive rheumatoid factor test in 2019.  Prednisone taper sent in.  Have asked her to follow-up with primary care about this issue.  Final Clinical Impressions(s) / UC Diagnoses   Final diagnoses:  Rheumatoid arthritis flare (HCC)     Discharge Instructions      Take prednisone 20 mg--3 tabs daily x3 days, then 2 tabs daily x3 days, then 1 tab daily x3 days, then one half tab daily x3 days, then stop.  This medication can cause her sugars to elevate; be is good as you can on your diet and drink plenty of fluids  Make a follow-up appointment with your primary care about this issue     ED Prescriptions     Medication Sig Dispense Auth. Provider   predniSONE (DELTASONE) 20 MG tablet 3 tabs daily x3 days, then 2 tabs daily x3 days,  then 1 tab daily x3 days, then one half tab daily x3 days, then stop 20 tablet Tevion Laforge, Janace Aris, MD      I have reviewed the PDMP during this encounter.   Zenia Resides, MD 06/28/22 [phone]

## 2022-06-28 NOTE — ED Triage Notes (Signed)
Pt sts generalized joint pain x 3 days that she sts feels like an RA flare; pt denies injury

## 2022-06-28 NOTE — Discharge Instructions (Signed)
Take prednisone 20 mg--3 tabs daily x3 days, then 2 tabs daily x3 days, then 1 tab daily x3 days, then one half tab daily x3 days, then stop.  This medication can cause her sugars to elevate; be is good as you can on your diet and drink plenty of fluids  Make a follow-up appointment with your primary care about this issue

## 2022-09-13 ENCOUNTER — Encounter: Payer: BLUE CROSS/BLUE SHIELD | Admitting: Family

## 2022-09-13 NOTE — Progress Notes (Signed)
Erroneous encounter-disregard

## 2022-09-20 ENCOUNTER — Ambulatory Visit (INDEPENDENT_AMBULATORY_CARE_PROVIDER_SITE_OTHER): Payer: BLUE CROSS/BLUE SHIELD | Admitting: Family

## 2022-09-20 ENCOUNTER — Encounter: Payer: Self-pay | Admitting: Family

## 2022-09-20 VITALS — BP 118/84 | HR 87 | Temp 98.4°F | Ht 64.0 in | Wt 199.0 lb

## 2022-09-20 DIAGNOSIS — R5383 Other fatigue: Secondary | ICD-10-CM | POA: Diagnosis not present

## 2022-09-20 DIAGNOSIS — N951 Menopausal and female climacteric states: Secondary | ICD-10-CM

## 2022-09-20 DIAGNOSIS — G5602 Carpal tunnel syndrome, left upper limb: Secondary | ICD-10-CM

## 2022-09-20 MED ORDER — PREDNISONE 20 MG PO TABS
ORAL_TABLET | ORAL | 0 refills | Status: DC
Start: 2022-09-20 — End: 2022-12-19

## 2022-09-20 NOTE — Progress Notes (Signed)
Pt is asking for blood work for the kidney Dr.  Rock Nephew stated symptoms of menopause. States she need prednisone for hand swelling

## 2022-09-20 NOTE — Progress Notes (Signed)
Patient ID: April Byrd, female    DOB: May 21, 1975  MRN: [id number]  CC: Follow-Up  Subjective: April Byrd is a 47 y.o. female who presents for follow-up.  Her concerns today include:  - Reports established with Nephrology who requests labs be completed from Primary Care. Patient states she does not know which labs nephrologist would like to have collected today.  - Irregular periods and feeling fatigue. She declines referral to specialist.  - Left wrist swelling intermittently. She denies associated red flag symptoms. Reports in the past Prednisone tablets helped. - No further issues/concerns for discussion today.  Patient Active Problem List   Diagnosis Date Noted   Pain in right hand 02/25/2021   Microalbuminuria 06/29/2020   Elevated blood-pressure reading without diagnosis of hypertension 01/09/2020   Migraine without aura, not refractory 01/09/2020   Diabetes mellitus (HCC) 12/11/2018   Candidal vulvovaginitis 10/10/2018   Abnormal uterine bleeding 10/10/2018   Osteoarthritis of feet, bilateral 02/12/2018   Primary osteoarthritis of both knees 02/12/2018   Osteoarthritis of both hands 02/12/2018   Unspecified asthma(493.90) 04/26/2012   Obesity with body mass index 30 or greater 04/26/2012     Current Outpatient Medications on File Prior to Visit  Medication Sig Dispense Refill   Accu-Chek FastClix Lancets MISC USE AS DIRECTED ONCE DAILY TO  TEST  BLOOD  SUGAR DXE11.65 102 each 0   Blood Glucose Monitoring Suppl (ACCU-CHEK AVIVA PLUS) w/Device KIT 1 each by Does not apply route 3 (three) times daily. Use to check blood sugars 3 times daily. DXE11.65 1 kit 0   Continuous Glucose Sensor (FREESTYLE LIBRE 2 SENSOR) MISC 1 each by Other route every 14 (fourteen) days. USE AS DIRECTED 6 each 3   gabapentin (NEURONTIN) 300 MG capsule Take 1 capsule by mouth at bedtime 90 capsule 2   glucose blood (ACCU-CHEK GUIDE) test strip Test 3 times daily DxE11.65 100 each 2    losartan (COZAAR) 50 MG tablet Take 1 tablet (50 mg total) by mouth daily. 90 tablet 0   metFORMIN (GLUCOPHAGE) 500 MG tablet Take 2 tablets (1,000 mg total) by mouth daily with breakfast. 180 tablet 3   naproxen (NAPROSYN) 500 MG tablet Take 1 tablet (500 mg total) by mouth 2 (two) times daily with a meal. 30 tablet 0   Semaglutide, 1 MG/DOSE, (OZEMPIC, 1 MG/DOSE,) 4 MG/3ML SOPN Inject 1 mg into the skin once a week. 9 mL 3   No current facility-administered medications on file prior to visit.    Allergies  Allergen Reactions   Penicillins Other (See Comments) and Rash    Has patient had a PCN reaction causing immediate rash, facial/tongue/throat swelling, SOB or lightheadedness with hypotension: Unknown Has patient had a PCN reaction causing severe rash involving mucus membranes or skin necrosis: Unknown Has patient had a PCN reaction that required hospitalization: Unknown Has patient had a PCN reaction occurring within the last 10 years: No If all of the above answers are "NO", then may proceed with Cephalosporin use.  Has patient had a PCN reaction causing immediate rash, facial/tongue/throat swelling, SOB or lightheadedness with hypotension: Unknown Has patient had a PCN reaction causing severe rash involving mucus membranes or skin necrosis: Unknown Has patient had a PCN reaction that required hospitalization: Unknown Has patient had a PCN reaction occurring within the last 10 years: No If all of the above answers are "NO", then may proceed with Cephalosporin use.    Social History   Socioeconomic History   Marital status:  Single    Spouse name: Not on file   Number of children: Not on file   Years of education: Not on file   Highest education level: Not on file  Occupational History   Not on file  Tobacco Use   Smoking status: Never    Passive exposure: Never   Smokeless tobacco: Never  Vaping Use   Vaping status: Never Used  Substance and Sexual Activity   Alcohol  use: Yes    Comment: social    Drug use: No   Sexual activity: Yes    Birth control/protection: Condom, Surgical  Other Topics Concern   Not on file  Social History Narrative   Not on file   Social Determinants of Health   Financial Resource Strain: Not on file  Food Insecurity: Not on file  Transportation Needs: Not on file  Physical Activity: Not on file  Stress: Not on file  Social Connections: Unknown (06/08/2021)   Received from [facility]   Social Network    Social Network: Not on file  Intimate Partner Violence: Unknown (04/30/2021)   Received from [facility]   HITS    Physically Hurt: Not on file    Insult or Talk Down To: Not on file    Threaten Physical Harm: Not on file    Scream or Curse: Not on file    Family History  Problem Relation Age of Onset   Diabetes Mother    Hypertension Mother    Kidney disease Father    Healthy Brother    Healthy Son    Healthy Son    Healthy Son     Past Surgical History:  Procedure Laterality Date   CESAREAN SECTION     x 3   CHOLECYSTECTOMY N/A 04/25/2012   Procedure: LAPAROSCOPIC CHOLECYSTECTOMY WITH INTRAOPERATIVE CHOLANGIOGRAM;  Surgeon: Mariella Saa, MD;  Location: [facility];  Service: General;  Laterality: N/A;   hydrocephalus surgery     I & D EXTREMITY Right 01/06/2021   Procedure: IRRIGATION AND DEBRIDEMENT OF INDEX FINGER;  Surgeon: Bradly Bienenstock, MD;  Location: [facility];  Service: Orthopedics;  Laterality: Right;   TUBAL LIGATION      ROS: Review of Systems Negative except as stated above  PHYSICAL EXAM: BP 118/84   Pulse 87   Temp 98.4 F (36.9 C) (Oral)   Ht 5\' 4"  (1.626 m)   Wt 199 lb (90.3 kg)   SpO2 98%   BMI 34.16 kg/m   Physical Exam HENT:     Head: Normocephalic and atraumatic.     Nose: Nose normal.     Mouth/Throat:     Mouth: Mucous membranes are moist.     Pharynx: Oropharynx is clear.  Eyes:     Extraocular Movements: Extraocular movements intact.     Conjunctiva/sclera:  Conjunctivae normal.     Pupils: Pupils are equal, round, and reactive to light.  Cardiovascular:     Rate and Rhythm: Normal rate and regular rhythm.     Pulses: Normal pulses.     Heart sounds: Normal heart sounds.  Pulmonary:     Effort: Pulmonary effort is normal.     Breath sounds: Normal breath sounds.  Musculoskeletal:        General: Normal range of motion.     Right shoulder: Normal.     Left shoulder: Normal.     Right upper arm: Normal.     Left upper arm: Normal.     Right elbow: Normal.  Left elbow: Normal.     Right forearm: Normal.     Left forearm: Normal.     Right wrist: Normal.     Left wrist: Normal.     Right hand: Normal.     Left hand: Normal.     Cervical back: Normal, normal range of motion and neck supple.     Thoracic back: Normal.     Lumbar back: Normal.     Right hip: Normal.     Left hip: Normal.     Right upper leg: Normal.     Left upper leg: Normal.     Right knee: Normal.     Left knee: Normal.     Right lower leg: Normal.     Left lower leg: Normal.     Right ankle: Normal.     Left ankle: Normal.     Right foot: Normal.     Left foot: Normal.  Neurological:     General: No focal deficit present.     Mental Status: She is alert and oriented to person, place, and time.  Psychiatric:        Mood and Affect: Mood normal.        Behavior: Behavior normal.    ASSESSMENT AND PLAN: 1. Fatigue, unspecified type 2. Perimenopause - Patient reports nephrologist requests labs will also screen for fatigue and perimenopause. - Routine screening. - CMP14+EGFR - Lipid panel - CBC - TSH - Vitamin D, 25-hydroxy  3. Carpal tunnel syndrome of left wrist - Prednisone as prescribed. Counseled on medication aderence. - Follow-up with primary provider as scheduled. - predniSONE (DELTASONE) 20 MG tablet; 3 tabs daily x3 days, then 2 tabs daily x3 days, then 1 tab daily x3 days, then one half tab daily x3 days, then stop  Dispense: 20 tablet;  Refill: 0    Patient was given the opportunity to ask questions.  Patient verbalized understanding of the plan and was able to repeat key elements of the plan. Patient was given clear instructions to go to Emergency Department or return to medical center if symptoms don't improve, worsen, or new problems develop.The patient verbalized understanding.   Orders Placed This Encounter  Procedures   CMP14+EGFR   Lipid panel   CBC   TSH   Vitamin D, 25-hydroxy     Requested Prescriptions   Signed Prescriptions Disp Refills   predniSONE (DELTASONE) 20 MG tablet 20 tablet 0    Sig: 3 tabs daily x3 days, then 2 tabs daily x3 days, then 1 tab daily x3 days, then one half tab daily x3 days, then stop    Follow-up with primary provider as scheduled.  Rema Fendt, NP

## 2022-09-21 ENCOUNTER — Other Ambulatory Visit: Payer: BLUE CROSS/BLUE SHIELD

## 2022-09-22 ENCOUNTER — Other Ambulatory Visit: Payer: BLUE CROSS/BLUE SHIELD

## 2022-11-14 ENCOUNTER — Telehealth: Payer: BLUE CROSS/BLUE SHIELD | Admitting: Physician Assistant

## 2022-11-14 DIAGNOSIS — J040 Acute laryngitis: Secondary | ICD-10-CM | POA: Diagnosis not present

## 2022-11-14 DIAGNOSIS — J029 Acute pharyngitis, unspecified: Secondary | ICD-10-CM

## 2022-11-14 NOTE — Progress Notes (Signed)
E-Visit for Sore Throat  We are sorry that you are not feeling well.  Here is how we plan to help!  Your symptoms indicate a likely viral infection (Pharyngitis).   Pharyngitis is inflammation in the back of the throat which can cause a sore throat, scratchiness and sometimes difficulty swallowing.   Pharyngitis is typically caused by a respiratory virus and will just run its course.  Please keep in mind that your symptoms could last up to 10 days.  For throat pain, we recommend over the counter oral pain relief medications such as acetaminophen or aspirin, or anti-inflammatory medications such as ibuprofen or naproxen sodium.  Topical treatments such as oral throat lozenges or sprays may be used as needed.  Avoid close contact with loved ones, especially the very young and elderly.  Remember to wash your hands thoroughly throughout the day as this is the number one way to prevent the spread of infection and wipe down door knobs and counters with disinfectant.  After careful review of your answers, I would not recommend an antibiotic for your condition.  Antibiotics should not be used to treat conditions that we suspect are caused by viruses like the virus that causes the common cold or flu. However, some people can have Strep with atypical symptoms. You may need formal testing in clinic or office to confirm if your symptoms continue or worsen.  Providers prescribe antibiotics to treat infections caused by bacteria. Antibiotics are very powerful in treating bacterial infections when they are used properly.  To maintain their effectiveness, they should be used only when necessary.  Overuse of antibiotics has resulted in the development of super bugs that are resistant to treatment!    Home Care: Only take medications as instructed by your medical team. Do not drink alcohol while taking these medications. A steam or ultrasonic humidifier can help congestion.  You can place a towel over your head and  breathe in the steam from hot water coming from a faucet. Avoid close contacts especially the very young and the elderly. Cover your mouth when you cough or sneeze. Always remember to wash your hands.  Get Help Right Away If: You develop worsening fever or throat pain. You develop a severe head ache or visual changes. Your symptoms persist after you have completed your treatment plan.  Make sure you Understand these instructions. Will watch your condition. Will get help right away if you are not doing well or get worse.   Thank you for choosing an e-visit.  Your e-visit answers were reviewed by a board certified advanced clinical practitioner to complete your personal care plan. Depending upon the condition, your plan could have included both over the counter or prescription medications.  Please review your pharmacy choice. Make sure the pharmacy is open so you can pick up prescription now. If there is a problem, you may contact your provider through MyChart messaging and have the prescription routed to another pharmacy.  Your safety is important to us. If you have drug allergies check your prescription carefully.   For the next 24 hours you can use MyChart to ask questions about today's visit, request a non-urgent call back, or ask for a work or school excuse. You will get an email in the next two days asking about your experience. I hope that your e-visit has been valuable and will speed your recovery.  I have spent 5 minutes in review of e-visit questionnaire, review and updating patient chart, medical decision making and response   to patient.   Yanette Tripoli M Ronak Duquette, PA-C  

## 2022-11-15 ENCOUNTER — Ambulatory Visit: Payer: BLUE CROSS/BLUE SHIELD | Admitting: Internal Medicine

## 2022-11-15 NOTE — Progress Notes (Deleted)
Name: April Byrd  MRN/ DOB: [id number], Jun 18, 1975   Age/ Sex: 47 y.o., female    PCP: Rema Fendt, NP   Reason for Endocrinology Evaluation: Type 2 Diabetes Mellitus     Date of Initial Endocrinology Visit: 11/22/2021    PATIENT IDENTIFIER: April Byrd is a 47 y.o. female with a past medical history of DM . The patient presented for initial endocrinology clinic visit on 11/22/2021 for consultative assistance with her diabetes management.    HPI: April Byrd was    Diagnosed with DM > 16 yrs ago  Prior Medications tried/Intolerance: Started on Insulin 2022 has been off insulin 07/2021. Started Ozempic 2023 Hemoglobin A1c has ranged from 9.8% in 2023, peaking at 12.8% in 2020.    On her initial visit to our clinic she had an A1c of 6.4%, she was on metformin, glimepiride, and Ozempic, we switched Ozempic to Trulicity due to insurance requirement  Glimepiride discontinued 04/2022  SUBJECTIVE:   During the last visit (05/24/2022): A1c 6.8%  Today (11/15/22): April Byrd is here for follow-up on diabetes management.  She checks her blood sugars 0 times daily. She has the freestyle libre 2 but has not been using it. Needs a refill   Feet tingling  is improving  Denies nausea, vomiting  Has once daily loose stools due to Metformin      HOME DIABETES REGIMEN: Metformin 500 mg 2 tabs daily  Ozempic 1 mg weekly    Statin: no ACE-I/ARB: yes    METER DOWNLOAD SUMMARY:did not bring    DIABETIC COMPLICATIONS: Microvascular complications:  Neuropathy Denies: CKD Last eye exam: Completed 2022 Q 2 yrs   Macrovascular complications:   Denies: CAD, PVD, CVA   PAST HISTORY: Past Medical History:  Past Medical History:  Diagnosis Date   Asthma    sports induced   BMI 37.0-37.9,adult 04/26/2012   Calculus of gallbladder with acute cholecystitis 04/26/2012   Calculus of gallbladder with acute cholecystitis, without mention of obstruction 04/26/2012   Type II or  unspecified type diabetes mellitus without mention of complication, not stated as uncontrolled 04/26/2012   Unspecified asthma(493.90) 04/26/2012   Unspecified essential hypertension 04/26/2012   Past Surgical History:  Past Surgical History:  Procedure Laterality Date   CESAREAN SECTION     x 3   CHOLECYSTECTOMY N/A 04/25/2012   Procedure: LAPAROSCOPIC CHOLECYSTECTOMY WITH INTRAOPERATIVE CHOLANGIOGRAM;  Surgeon: Mariella Saa, MD;  Location: [facility];  Service: General;  Laterality: N/A;   hydrocephalus surgery     I & D EXTREMITY Right 01/06/2021   Procedure: IRRIGATION AND DEBRIDEMENT OF INDEX FINGER;  Surgeon: Bradly Bienenstock, MD;  Location: [facility];  Service: Orthopedics;  Laterality: Right;   TUBAL LIGATION      Social History:  reports that she has never smoked. She has never been exposed to tobacco smoke. She has never used smokeless tobacco. She reports current alcohol use. She reports that she does not use drugs. Family History:  Family History  Problem Relation Age of Onset   Diabetes Mother    Hypertension Mother    Kidney disease Father    Healthy Brother    Healthy Son    Healthy Son    Healthy Son      HOME MEDICATIONS: Allergies as of 11/15/2022       Reactions   Penicillins Other (See Comments), Rash   Has patient had a PCN reaction causing immediate rash, facial/tongue/throat swelling, SOB or lightheadedness with hypotension: Unknown Has patient had  a PCN reaction causing severe rash involving mucus membranes or skin necrosis: Unknown Has patient had a PCN reaction that required hospitalization: Unknown Has patient had a PCN reaction occurring within the last 10 years: No If all of the above answers are "NO", then may proceed with Cephalosporin use. Has patient had a PCN reaction causing immediate rash, facial/tongue/throat swelling, SOB or lightheadedness with hypotension: Unknown Has patient had a PCN reaction causing severe rash involving mucus membranes or skin  necrosis: Unknown Has patient had a PCN reaction that required hospitalization: Unknown Has patient had a PCN reaction occurring within the last 10 years: No If all of the above answers are "NO", then may proceed with Cephalosporin use.        Medication List        Accurate as of November 15, 2022  7:13 AM. If you have any questions, ask your nurse or doctor.          Accu-Chek Aviva Plus w/Device Kit 1 each by Does not apply route 3 (three) times daily. Use to check blood sugars 3 times daily. WUJ81.19   Accu-Chek FastClix Lancets Misc USE AS DIRECTED ONCE DAILY TO  TEST  BLOOD  SUGAR DXE11.65   Accu-Chek Guide test strip Generic drug: glucose blood Test 3 times daily DxE11.65   FreeStyle Libre 2 Sensor Misc 1 each by Other route every 14 (fourteen) days. USE AS DIRECTED   gabapentin 300 MG capsule Commonly known as: NEURONTIN Take 1 capsule by mouth at bedtime   losartan 50 MG tablet Commonly known as: COZAAR Take 1 tablet (50 mg total) by mouth daily.   metFORMIN 500 MG tablet Commonly known as: GLUCOPHAGE Take 2 tablets (1,000 mg total) by mouth daily with breakfast.   naproxen 500 MG tablet Commonly known as: Naprosyn Take 1 tablet (500 mg total) by mouth 2 (two) times daily with a meal.   Ozempic (1 MG/DOSE) 4 MG/3ML Sopn Generic drug: Semaglutide (1 MG/DOSE) Inject 1 mg into the skin once a week.   predniSONE 20 MG tablet Commonly known as: DELTASONE 3 tabs daily x3 days, then 2 tabs daily x3 days, then 1 tab daily x3 days, then one half tab daily x3 days, then stop         ALLERGIES: Allergies  Allergen Reactions   Penicillins Other (See Comments) and Rash    Has patient had a PCN reaction causing immediate rash, facial/tongue/throat swelling, SOB or lightheadedness with hypotension: Unknown Has patient had a PCN reaction causing severe rash involving mucus membranes or skin necrosis: Unknown Has patient had a PCN reaction that required  hospitalization: Unknown Has patient had a PCN reaction occurring within the last 10 years: No If all of the above answers are "NO", then may proceed with Cephalosporin use.  Has patient had a PCN reaction causing immediate rash, facial/tongue/throat swelling, SOB or lightheadedness with hypotension: Unknown Has patient had a PCN reaction causing severe rash involving mucus membranes or skin necrosis: Unknown Has patient had a PCN reaction that required hospitalization: Unknown Has patient had a PCN reaction occurring within the last 10 years: No If all of the above answers are "NO", then may proceed with Cephalosporin use.     REVIEW OF SYSTEMS: A comprehensive ROS was conducted with the patient and is negative except as per HPI     OBJECTIVE:   VITAL SIGNS: There were no vitals taken for this visit.   PHYSICAL EXAM:  General: Pt appears well and is in  NAD  Neck: General: Supple without adenopathy or carotid bruits. Thyroid: Thyroid size normal.  No goiter or nodules appreciated.   Lungs: Clear with good BS bilat with no rales, rhonchi, or wheezes  Heart: RRR   Abdomen:  soft, nontender  Extremities:  Lower extremities - Trace pretibial edema  Neuro: MS is good with appropriate affect, pt is alert and Ox3    DM foot exam: 11/22/2021   The skin of the feet is intact without sores or ulcerations. The pedal pulses are 2+ on right and 2+ on left. The sensation is intact to a screening 5.07, 10 gram monofilament bilaterally  DATA REVIEWED:  Lab Results  Component Value Date   HGBA1C 6.8 (A) 05/24/2022   HGBA1C 6.4 (A) 11/22/2021   HGBA1C 9.8 (A) 02/16/2021    Latest Reference Range & Units 08/09/21 09:24  Alkaline Phosphatase 44 - 121 IU/L 95  Albumin 3.9 - 4.9 g/dL 3.9  AST 0 - 40 IU/L 19  ALT 0 - 32 IU/L 14  Total Protein 6.0 - 8.5 g/dL 7.4  Total Bilirubin 0.0 - 1.2 mg/dL 0.8  BILIRUBIN, DIRECT 0.00 - 0.40 mg/dL 4.09  Total CHOL/HDL Ratio 0.0 - 4.4 ratio 3.0   Cholesterol, Total 100 - 199 mg/dL 811  HDL Cholesterol >91 mg/dL 53  Triglycerides 0 - 478 mg/dL 295  VLDL Cholesterol Cal 5 - 40 mg/dL 19  LDL Chol Calc (NIH) 0 - 99 mg/dL 89    ASSESSMENT / PLAN / RECOMMENDATIONS:   1) Type 2 Diabetes Mellitus, Optimally controlled, With neuropathic  complications - Most recent A1c of 6.8 %. Goal A1c < 7.0 %.    - A1c continues to be at goal  - Encouraged glucose checks at home  - We discussed increasing Ozempic and stopping Glimepiride  - She is past due for labs , declined labs today as she needs to hydrate for 3 days prior to labs , she would like to have these done at PCP's    MEDICATIONS: Continue metformin 500 mg, 2 tabs daily Stop  glimepiride 2 mg daily Increase  Ozempic 1 mg weekly  EDUCATION / INSTRUCTIONS: BG monitoring instructions: Patient is instructed to check her blood sugars 3 times a day, before each meal. Call [facility] if: BG persistently < 70  I reviewed the Rule of 15 for the treatment of hypoglycemia in detail with the patient. Literature supplied.   2) Diabetic complications:  Eye: Does not have known diabetic retinopathy.  Neuro/ Feet: Does  have known diabetic peripheral neuropathy. Renal: Patient does not have known baseline CKD. She is not on an ACEI/ARB at present.      F/U in 6 months   Signed electronically by: Lyndle Herrlich, MD  [facility]  [facility] [address] [place] [place], [place] [address] Phone: [phone] FAX: [phone]   CC: Rema Fendt, NP [address] [place] [address] Phone: [phone]  Fax: [phone]    Return to Endocrinology clinic as below: Future Appointments  Date Time Provider Department Center  11/15/2022 11:10 AM Videl Nobrega, Konrad Dolores, MD [facility] None  11/22/2022  9:15 AM Constant, Gigi Gin, MD [facility] None

## 2022-11-17 ENCOUNTER — Telehealth: Payer: BLUE CROSS/BLUE SHIELD | Admitting: Physician Assistant

## 2022-11-17 DIAGNOSIS — H6690 Otitis media, unspecified, unspecified ear: Secondary | ICD-10-CM

## 2022-11-17 MED ORDER — AZITHROMYCIN 200 MG/5ML PO SUSR
ORAL | 0 refills | Status: DC
Start: 2022-11-17 — End: 2022-12-19

## 2022-11-17 NOTE — Progress Notes (Signed)
E-Visit for Ear Pain - Acute Otitis Media   We are sorry that you are not feeling well. Here is how we plan to help!  Based on what you have shared with me it looks like you have Acute Otitis Media.  Acute Otitis Media is an infection of the middle or "inner" ear. This type of infection can cause redness, inflammation, and fluid buildup behind the tympanic membrane (ear drum).  The usual symptoms include: Earache/Pain Fever Upper respiratory symptoms Lack of energy/Fatigue/Malaise Slight hearing loss gradually worsening- if the inner ear fills with fluid What causes middle ear infections? Most middle ear infections occur when an infection such as a cold, leads to a build-up of mucus in the middle ear and causes the Eustachian tube (a thin tube that runs from the middle ear to the back of the nose) to become swollen or blocked.   This means mucus can't drain away properly, making it easier for an infection to spread into the middle ear.  How middle ear infections are treated: Most ear infections clear up within three to five days and don't need any specific treatment. If necessary, tylenol or ibuprofen should be used to relieve pain and a high temperature.  If you develop a fever higher than 102, or any significantly worsening symptoms, this could indicate a more serious infection moving to the middle/inner and needs face to face evaluation in an office by a provider.   Antibiotics aren't routinely used to treat middle ear infections, although they may occasionally be prescribed if symptoms persist or are particularly severe. Given your presentation,   I have prescribed Azithromycin liquid suspension to take as directed. If not already doing so, start a nasal steroid spray OTC like Flonase or Nasacort, and an antihistamine like Claritin.   Your symptoms should improve over the next 3 days and should resolve in about 7 days. Be sure to complete ALL of the prescription(s) given.  HOME  CARE: Wash your hands frequently. If you are prescribed an ear drop, do not place the tip of the bottle on your ear or touch it with your fingers. You can take Acetaminophen 650 mg every 4-6 hours as needed for pain.  If pain is severe or moderate, you can apply a heating pad (set on low) or hot water bottle (wrapped in a towel) to outer ear for 20 minutes.  This will also increase drainage.  GET HELP RIGHT AWAY IF: Fever is over 102.2 degrees. You develop progressive ear pain or hearing loss. Ear symptoms persist longer than 3 days after treatment.  MAKE SURE YOU: Understand these instructions. Will watch your condition. Will get help right away if you are not doing well or get worse.  Thank you for choosing an e-visit.  Your e-visit answers were reviewed by a board certified advanced clinical practitioner to complete your personal care plan. Depending upon the condition, your plan could have included both over the counter or prescription medications.  Please review your pharmacy choice. Make sure the pharmacy is open so you can pick up the prescription now. If there is a problem, you may contact your provider through [employer] and have the prescription routed to another pharmacy.  Your safety is important to [place]. If you have drug allergies check your prescription carefully.   For the next 24 hours you can use MyChart to ask questions about today's visit, request a non-urgent call back, or ask for a work or school excuse. You will get an email  with a survey after your eVisit asking about your experience. We would appreciate your feedback. I hope that your e-visit has been valuable and will aid in your recovery.

## 2022-11-17 NOTE — Progress Notes (Signed)
I have spent 5 minutes in review of e-visit questionnaire, review and updating patient chart, medical decision making and response to patient.   Mia Milan Cody Jacklynn Dehaas, PA-C    

## 2022-11-18 ENCOUNTER — Telehealth: Payer: BLUE CROSS/BLUE SHIELD

## 2022-11-18 DIAGNOSIS — J9801 Acute bronchospasm: Secondary | ICD-10-CM

## 2022-11-19 MED ORDER — ALBUTEROL SULFATE HFA 108 (90 BASE) MCG/ACT IN AERS: 2.0000 | INHALATION_SPRAY | Freq: Four times a day (QID) | RESPIRATORY_TRACT | 1 refills | Status: AC | PRN

## 2022-11-19 MED ORDER — BENZONATATE 200 MG PO CAPS: 200.0000 mg | ORAL_CAPSULE | Freq: Three times a day (TID) | ORAL | 0 refills | Status: DC | PRN

## 2022-11-19 NOTE — Progress Notes (Signed)
E-Visit for Cough   We are sorry that you are not feeling well.  Here is how we plan to help!  Based on your presentation I believe you most likely have cough due to a virus or like you mention changes in weather.  This is called viral bronchitis and is best treated by rest, plenty of fluids and control of the cough.     In addition you may use A prescription cough medication called Tessalon Perles 200mg . You may take 1 capsules every 8 hours as needed for your cough. I've also sent a Prescription for an Albuterol inhaler for your use especially when you are short of breath or wheezing.     From your responses in the eVisit questionnaire you describe inflammation in the upper respiratory tract which is causing a significant cough.  This is commonly called Bronchitis and has four common causes:   Allergies Viral Infections Acid Reflux Bacterial Infection Allergies, viruses and acid reflux are treated by controlling symptoms or eliminating the cause. An example might be a cough caused by taking certain blood pressure medications. You stop the cough by changing the medication. Another example might be a cough caused by acid reflux. Controlling the reflux helps control the cough.  USE OF BRONCHODILATOR ("RESCUE") INHALERS: There is a risk from using your bronchodilator too frequently.  The risk is that over-reliance on a medication which only relaxes the muscles surrounding the breathing tubes can reduce the effectiveness of medications prescribed to reduce swelling and congestion of the tubes themselves.  Although you feel brief relief from the bronchodilator inhaler, your asthma may actually be worsening with the tubes becoming more swollen and filled with mucus.  This can delay other crucial treatments, such as oral steroid medications. If you need to use a bronchodilator inhaler daily, several times per day, you should discuss this with your provider.  There are probably better treatments that  could be used to keep your asthma under control.     HOME CARE Only take medications as instructed by your medical team. Complete the entire course of an antibiotic. Drink plenty of fluids and get plenty of rest. Avoid close contacts especially the very young and the elderly Cover your mouth if you cough or cough into your sleeve. Always remember to wash your hands A steam or ultrasonic humidifier can help congestion.   GET HELP RIGHT AWAY IF: You develop worsening fever. You become short of breath You cough up blood. Your symptoms persist after you have completed your treatment plan MAKE SURE YOU  Understand these instructions. Will watch your condition. Will get help right away if you are not doing well or get worse.    Thank you for choosing an e-visit.  Your e-visit answers were reviewed by a board certified advanced clinical practitioner to complete your personal care plan. Depending upon the condition, your plan could have included both over the counter or prescription medications.  Please review your pharmacy choice. Make sure the pharmacy is open so you can pick up prescription now. If there is a problem, you may contact your provider through [employer] and have the prescription routed to another pharmacy.  Your safety is important to [place]. If you have drug allergies check your prescription carefully.   For the next 24 hours you can use MyChart to ask questions about today's visit, request a non-urgent call back, or ask for a work or school excuse. You will get an email in the next two days asking  about your experience. I hope that your e-visit has been valuable and will speed your recovery.   I have spent 5 minutes in review of e-visit questionnaire, review and updating patient chart, medical decision making and response to patient.   Gilberto Better, PA-C

## 2022-11-21 ENCOUNTER — Telehealth: Payer: Self-pay

## 2022-11-22 ENCOUNTER — Encounter: Payer: BLUE CROSS/BLUE SHIELD | Admitting: Obstetrics and Gynecology

## 2022-12-08 ENCOUNTER — Ambulatory Visit: Payer: BLUE CROSS/BLUE SHIELD | Admitting: Internal Medicine

## 2022-12-08 NOTE — Progress Notes (Deleted)
Name: April Byrd  MRN/ DOB: [id number], 08-29-1975   Age/ Sex: 47 y.o., female    PCP: Rema Fendt, NP   Reason for Endocrinology Evaluation: Type 2 Diabetes Mellitus     Date of Initial Endocrinology Visit: 11/22/2021    PATIENT IDENTIFIER: April Byrd is a 47 y.o. female with a past medical history of DM . The patient presented for initial endocrinology clinic visit on 11/22/2021 for consultative assistance with her diabetes management.    HPI: April Byrd was    Diagnosed with DM > 16 yrs ago  Prior Medications tried/Intolerance: Started on Insulin 2022 has been off insulin 07/2021. Started Ozempic 2023 Hemoglobin A1c has ranged from 9.8% in 2023, peaking at 12.8% in 2020.    On her initial visit to our clinic she had an A1c of 6.4%, she was on metformin, glimepiride, and Ozempic, we switched Ozempic to Trulicity due to insurance requirement  Glimepiride discontinued 04/2022  SUBJECTIVE:   During the last visit (05/24/2022): A1c 6.8%  Today (12/08/22): April Byrd is here for follow-up on diabetes management.  She checks her blood sugars 0 times daily. She has the freestyle libre 2 but has not been using it. Needs a refill   Feet tingling  is improving  Denies nausea, vomiting  Has once daily loose stools due to Metformin      HOME DIABETES REGIMEN: Metformin 500 mg 2 tabs daily  Ozempic 1 mg weekly    Statin: no ACE-I/ARB: yes    METER DOWNLOAD SUMMARY:did not bring    DIABETIC COMPLICATIONS: Microvascular complications:  Neuropathy Denies: CKD Last eye exam: Completed 2022 Q 2 yrs   Macrovascular complications:   Denies: CAD, PVD, CVA   PAST HISTORY: Past Medical History:  Past Medical History:  Diagnosis Date   Asthma    sports induced   BMI 37.0-37.9,adult 04/26/2012   Calculus of gallbladder with acute cholecystitis 04/26/2012   Calculus of gallbladder with acute cholecystitis, without mention of obstruction 04/26/2012   Type II or  unspecified type diabetes mellitus without mention of complication, not stated as uncontrolled 04/26/2012   Unspecified asthma(493.90) 04/26/2012   Unspecified essential hypertension 04/26/2012   Past Surgical History:  Past Surgical History:  Procedure Laterality Date   CESAREAN SECTION     x 3   CHOLECYSTECTOMY N/A 04/25/2012   Procedure: LAPAROSCOPIC CHOLECYSTECTOMY WITH INTRAOPERATIVE CHOLANGIOGRAM;  Surgeon: Mariella Saa, MD;  Location: [facility];  Service: General;  Laterality: N/A;   hydrocephalus surgery     I & D EXTREMITY Right 01/06/2021   Procedure: IRRIGATION AND DEBRIDEMENT OF INDEX FINGER;  Surgeon: Bradly Bienenstock, MD;  Location: [facility];  Service: Orthopedics;  Laterality: Right;   TUBAL LIGATION      Social History:  reports that she has never smoked. She has never been exposed to tobacco smoke. She has never used smokeless tobacco. She reports current alcohol use. She reports that she does not use drugs. Family History:  Family History  Problem Relation Age of Onset   Diabetes Mother    Hypertension Mother    Kidney disease Father    Healthy Brother    Healthy Son    Healthy Son    Healthy Son      HOME MEDICATIONS: Allergies as of 12/08/2022       Reactions   Penicillins Other (See Comments), Rash   Has patient had a PCN reaction causing immediate rash, facial/tongue/throat swelling, SOB or lightheadedness with hypotension: Unknown Has patient had  a PCN reaction causing severe rash involving mucus membranes or skin necrosis: Unknown Has patient had a PCN reaction that required hospitalization: Unknown Has patient had a PCN reaction occurring within the last 10 years: No If all of the above answers are "NO", then may proceed with Cephalosporin use. Has patient had a PCN reaction causing immediate rash, facial/tongue/throat swelling, SOB or lightheadedness with hypotension: Unknown Has patient had a PCN reaction causing severe rash involving mucus membranes or skin  necrosis: Unknown Has patient had a PCN reaction that required hospitalization: Unknown Has patient had a PCN reaction occurring within the last 10 years: No If all of the above answers are "NO", then may proceed with Cephalosporin use.        Medication List        Accurate as of December 08, 2022  7:14 AM. If you have any questions, ask your nurse or doctor.          Accu-Chek Aviva Plus w/Device Kit 1 each by Does not apply route 3 (three) times daily. Use to check blood sugars 3 times daily. ZOX09.60   Accu-Chek FastClix Lancets Misc USE AS DIRECTED ONCE DAILY TO  TEST  BLOOD  SUGAR DXE11.65   Accu-Chek Guide test strip Generic drug: glucose blood Test 3 times daily DxE11.65   albuterol 108 (90 Base) MCG/ACT inhaler Commonly known as: VENTOLIN HFA Inhale 2 puffs into the lungs every 6 (six) hours as needed for wheezing or shortness of breath.   azithromycin 200 MG/5ML suspension Commonly known as: ZITHROMAX Take 12ml PO on Day 1. Then take 6 mL PO daily on Days 2-5   benzonatate 200 MG capsule Commonly known as: TESSALON Take 1 capsule (200 mg total) by mouth 3 (three) times daily as needed for cough.   FreeStyle Libre 2 Sensor Misc 1 each by Other route every 14 (fourteen) days. USE AS DIRECTED   gabapentin 300 MG capsule Commonly known as: NEURONTIN Take 1 capsule by mouth at bedtime   losartan 50 MG tablet Commonly known as: COZAAR Take 1 tablet (50 mg total) by mouth daily.   metFORMIN 500 MG tablet Commonly known as: GLUCOPHAGE Take 2 tablets (1,000 mg total) by mouth daily with breakfast.   naproxen 500 MG tablet Commonly known as: Naprosyn Take 1 tablet (500 mg total) by mouth 2 (two) times daily with a meal.   Ozempic (1 MG/DOSE) 4 MG/3ML Sopn Generic drug: Semaglutide (1 MG/DOSE) Inject 1 mg into the skin once a week.   predniSONE 20 MG tablet Commonly known as: DELTASONE 3 tabs daily x3 days, then 2 tabs daily x3 days, then 1 tab daily x3  days, then one half tab daily x3 days, then stop         ALLERGIES: Allergies  Allergen Reactions   Penicillins Other (See Comments) and Rash    Has patient had a PCN reaction causing immediate rash, facial/tongue/throat swelling, SOB or lightheadedness with hypotension: Unknown Has patient had a PCN reaction causing severe rash involving mucus membranes or skin necrosis: Unknown Has patient had a PCN reaction that required hospitalization: Unknown Has patient had a PCN reaction occurring within the last 10 years: No If all of the above answers are "NO", then may proceed with Cephalosporin use.  Has patient had a PCN reaction causing immediate rash, facial/tongue/throat swelling, SOB or lightheadedness with hypotension: Unknown Has patient had a PCN reaction causing severe rash involving mucus membranes or skin necrosis: Unknown Has patient had a PCN reaction  that required hospitalization: Unknown Has patient had a PCN reaction occurring within the last 10 years: No If all of the above answers are "NO", then may proceed with Cephalosporin use.     REVIEW OF SYSTEMS: A comprehensive ROS was conducted with the patient and is negative except as per HPI     OBJECTIVE:   VITAL SIGNS: There were no vitals taken for this visit.   PHYSICAL EXAM:  General: Pt appears well and is in NAD  Neck: General: Supple without adenopathy or carotid bruits. Thyroid: Thyroid size normal.  No goiter or nodules appreciated.   Lungs: Clear with good BS bilat with no rales, rhonchi, or wheezes  Heart: RRR   Abdomen:  soft, nontender  Extremities:  Lower extremities - Trace pretibial edema  Neuro: MS is good with appropriate affect, pt is alert and Ox3    DM foot exam: 11/22/2021   The skin of the feet is intact without sores or ulcerations. The pedal pulses are 2+ on right and 2+ on left. The sensation is intact to a screening 5.07, 10 gram monofilament bilaterally  DATA REVIEWED:  Lab  Results  Component Value Date   HGBA1C 6.8 (A) 05/24/2022   HGBA1C 6.4 (A) 11/22/2021   HGBA1C 9.8 (A) 02/16/2021    Latest Reference Range & Units 08/09/21 09:24  Alkaline Phosphatase 44 - 121 IU/L 95  Albumin 3.9 - 4.9 g/dL 3.9  AST 0 - 40 IU/L 19  ALT 0 - 32 IU/L 14  Total Protein 6.0 - 8.5 g/dL 7.4  Total Bilirubin 0.0 - 1.2 mg/dL 0.8  BILIRUBIN, DIRECT 0.00 - 0.40 mg/dL 4.13  Total CHOL/HDL Ratio 0.0 - 4.4 ratio 3.0  Cholesterol, Total 100 - 199 mg/dL 244  HDL Cholesterol >01 mg/dL 53  Triglycerides 0 - 027 mg/dL 253  VLDL Cholesterol Cal 5 - 40 mg/dL 19  LDL Chol Calc (NIH) 0 - 99 mg/dL 89    ASSESSMENT / PLAN / RECOMMENDATIONS:   1) Type 2 Diabetes Mellitus, Optimally controlled, With neuropathic  complications - Most recent A1c of 6.8 %. Goal A1c < 7.0 %.    - A1c continues to be at goal  - Encouraged glucose checks at home  - We discussed increasing Ozempic and stopping Glimepiride  - She is past due for labs , declined labs today as she needs to hydrate for 3 days prior to labs , she would like to have these done at PCP's    MEDICATIONS: Continue metformin 500 mg, 2 tabs daily Stop  glimepiride 2 mg daily Increase  Ozempic 1 mg weekly  EDUCATION / INSTRUCTIONS: BG monitoring instructions: Patient is instructed to check her blood sugars 3 times a day, before each meal. Call [facility] if: BG persistently < 70  I reviewed the Rule of 15 for the treatment of hypoglycemia in detail with the patient. Literature supplied.   2) Diabetic complications:  Eye: Does not have known diabetic retinopathy.  Neuro/ Feet: Does  have known diabetic peripheral neuropathy. Renal: Patient does not have known baseline CKD. She is not on an ACEI/ARB at present.      F/U in 6 months   Signed electronically by: Lyndle Herrlich, MD  [facility]  [facility] [address] [place] [place], [place] [address] Phone:  [phone] FAX: [phone]   CC: Rema Fendt, NP [address] [place] [address] Phone: [phone]  Fax: [phone]    Return to  Endocrinology clinic as below: Future Appointments  Date Time Provider Department Center  12/08/2022 10:30 AM Abcde Oneil, Konrad Dolores, MD [id number] None

## 2022-12-19 ENCOUNTER — Telehealth: Payer: BLUE CROSS/BLUE SHIELD | Admitting: Physician Assistant

## 2022-12-19 DIAGNOSIS — K047 Periapical abscess without sinus: Secondary | ICD-10-CM

## 2022-12-19 MED ORDER — CLINDAMYCIN HCL 300 MG PO CAPS: 300.0000 mg | ORAL_CAPSULE | Freq: Three times a day (TID) | ORAL | 0 refills | Status: DC

## 2022-12-19 NOTE — Progress Notes (Signed)
E-Visit for Dental Pain  We are sorry that you are not feeling well.  Here is how we plan to help!  Based on what you have shared with me in the questionnaire, it sounds like you have a dental infection.   I have prescribed Clindamycin 300mg  3 times a day for 7 days  It is imperative that you see a dentist within 10 days of this eVisit to determine the cause of the dental pain and be sure it is adequately treated  A toothache or tooth pain is caused when the nerve in the root of a tooth or surrounding a tooth is irritated. Dental (tooth) infection, decay, injury, or loss of a tooth are the most common causes of dental pain. Pain may also occur after an extraction (tooth is pulled out). Pain sometimes originates from other areas and radiates to the jaw, thus appearing to be tooth pain.Bacteria growing inside your mouth can contribute to gum disease and dental decay, both of which can cause pain. A toothache occurs from inflammation of the central portion of the tooth called pulp. The pulp contains nerve endings that are very sensitive to pain. Inflammation to the pulp or pulpitis may be caused by dental cavities, trauma, and infection.    HOME CARE:   For toothaches: Over-the-counter pain medications such as acetaminophen or ibuprofen may be used. Take these as directed on the package while you arrange for a dental appointment. Avoid very cold or hot foods, because they may make the pain worse. You may get relief from biting on a cotton ball soaked in oil of cloves. You can get oil of cloves at most drug stores.  For jaw pain:  Aspirin may be helpful for problems in the joint of the jaw in adults. If pain happens every time you open your mouth widely, the temporomandibular joint (TMJ) may be the source of the pain. Yawning or taking a large bite of food may worsen the pain. An appointment with your doctor or dentist will help you find the cause.     GET HELP RIGHT AWAY IF:  You have a high  fever or chills If you have had a recent head or face injury and develop headache, light headedness, nausea, vomiting, or other symptoms that concern you after an injury to your face or mouth, you could have a more serious injury in addition to your dental injury. A facial rash associated with a toothache: This condition may improve with medication. Contact your doctor for them to decide what is appropriate. Any jaw pain occurring with chest pain: Although jaw pain is most commonly caused by dental disease, it is sometimes referred pain from other areas. People with heart disease, especially people who have had stents placed, people with diabetes, or those who have had heart surgery may have jaw pain as a symptom of heart attack or angina. If your jaw or tooth pain is associated with lightheadedness, sweating, or shortness of breath, you should see a doctor as soon as possible. Trouble swallowing or excessive pain or bleeding from gums: If you have a history of a weakened immune system, diabetes, or steroid use, you may be more susceptible to infections. Infections can often be more severe and extensive or caused by unusual organisms. Dental and gum infections in people with these conditions may require more aggressive treatment. An abscess may need draining or IV antibiotics, for example.  MAKE SURE YOU   Understand these instructions. Will watch your condition. Will get help  right away if you are not doing well or get worse.  Thank you for choosing an e-visit.  Your e-visit answers were reviewed by a board certified advanced clinical practitioner to complete your personal care plan. Depending upon the condition, your plan could have included both over the counter or prescription medications.  Please review your pharmacy choice. Make sure the pharmacy is open so you can pick up prescription now. If there is a problem, you may contact your provider through [employer] and have the prescription  routed to another pharmacy.  Your safety is important to [place]. If you have drug allergies check your prescription carefully.   For the next 24 hours you can use MyChart to ask questions about today's visit, request a non-urgent call back, or ask for a work or school excuse. You will get an email in the next two days asking about your experience. I hope that your e-visit has been valuable and will speed your recovery.  I have spent 5 minutes in review of e-visit questionnaire, review and updating patient chart, medical decision making and response to patient.   Margaretann Loveless, PA-C

## 2023-02-03 ENCOUNTER — Telehealth: Payer: BLUE CROSS/BLUE SHIELD | Admitting: Family Medicine

## 2023-02-03 DIAGNOSIS — J111 Influenza due to unidentified influenza virus with other respiratory manifestations: Secondary | ICD-10-CM

## 2023-02-03 MED ORDER — OSELTAMIVIR PHOSPHATE 75 MG PO CAPS: 75.0000 mg | ORAL_CAPSULE | Freq: Two times a day (BID) | ORAL | 0 refills | Status: AC

## 2023-02-03 NOTE — Progress Notes (Signed)
 E visit for Flu like symptoms   We are sorry that you are not feeling well.  Here is how we plan to help! Based on what you have shared with me it looks like you may have a respiratory virus that may be influenza.  Influenza or "the flu" is   an infection caused by a respiratory virus. The flu virus is highly contagious and persons who did not receive their yearly flu vaccination may "catch" the flu from close contact.  We have anti-viral medications to treat the viruses that cause this infection. They are not a "cure" and only shorten the course of the infection. These prescriptions are most effective when they are given within the first 2 days of "flu" symptoms. Antiviral medication are indicated if you have a high risk of complications from the flu. You should  also consider an antiviral medication if you are in close contact with someone who is at risk. These medications can help patients avoid complications from the flu  but have side effects that you should know. Possible side effects from Tamiflu  or oseltamivir  include nausea, vomiting, diarrhea, dizziness, headaches, eye redness, sleep problems or other respiratory symptoms. You should not take Tamiflu  if you have an allergy to oseltamivir  or any to the ingredients in Tamiflu .  Based upon your symptoms and potential risk factors I have prescribed Oseltamivir  (Tamiflu ).  It has been sent to your designated pharmacy.  You will take one 75 mg capsule orally twice a day for the next 5 days.  I have sent you a note for today and tomorrow which is our limit. Please follow back up if you need longer. Thank you! Abimbola Aki  ANYONE WHO HAS FLU SYMPTOMS SHOULD: Stay home. The flu is highly contagious and going out or to work exposes others! Be sure to drink plenty of fluids. Water is fine as well as fruit juices, sodas and electrolyte beverages. You may want to stay away from caffeine or alcohol. If you are nauseated, try taking small sips of liquids. How  do you know if you are getting enough fluid? Your urine should be a pale yellow or almost colorless. Get rest. Taking a steamy shower or using a humidifier may help nasal congestion and ease sore throat pain. Using a saline nasal spray works much the same way. Cough drops, hard candies and sore throat lozenges may ease your cough. Line up a caregiver. Have someone check on you regularly.   GET HELP RIGHT AWAY IF: You cannot keep down liquids or your medications. You become short of breath Your fell like you are going to pass out or loose consciousness. Your symptoms persist after you have completed your treatment plan MAKE SURE YOU  Understand these instructions. Will watch your condition. Will get help right away if you are not doing well or get worse.  Your e-visit answers were reviewed by a board certified advanced clinical practitioner to complete your personal care plan.  Depending on the condition, your plan could have included both over the counter or prescription medications.  If there is a problem please reply  once you have received a response from your provider.  Your safety is important to us .  If you have drug allergies check your prescription carefully.    You can use MyChart to ask questions about today's visit, request a non-urgent call back, or ask for a work or school excuse for 24 hours related to this e-Visit. If it has been greater than 24 hours you  will need to follow up with your provider, or enter a new e-Visit to address those concerns.  You will get an e-mail in the next two days asking about your experience.  I hope that your e-visit has been valuable and will speed your recovery. Thank you for using e-visits.    have provided 5 minutes of non face to face time during this encounter for chart review and documentation.

## 2023-03-22 ENCOUNTER — Ambulatory Visit: Payer: BLUE CROSS/BLUE SHIELD | Admitting: Internal Medicine

## 2023-03-22 ENCOUNTER — Encounter: Payer: Self-pay | Admitting: Internal Medicine

## 2023-03-22 ENCOUNTER — Other Ambulatory Visit: Payer: Self-pay | Admitting: Internal Medicine

## 2023-03-22 NOTE — Progress Notes (Deleted)
 Name: April Byrd  MRN/ DOB: [id number], 01-09-1976   Age/ Sex: 48 y.o., female    PCP: Rema Fendt, NP   Reason for Endocrinology Evaluation: Type 2 Diabetes Mellitus     Date of Initial Endocrinology Visit: 11/22/2021    PATIENT IDENTIFIER: April Byrd is a 48 y.o. female with a past medical history of DM . The patient presented for initial endocrinology clinic visit on 11/22/2021 for consultative assistance with her diabetes management.    HPI: April Byrd was    Diagnosed with DM > 16 yrs ago  Prior Medications tried/Intolerance: Started on Insulin 2022 has been off insulin 07/2021. Started Ozempic 2023 Hemoglobin A1c has ranged from 9.8% in 2023, peaking at 12.8% in 2020.    On her initial visit to our clinic she had an A1c of 6.4%, she was on metformin, glimepiride, and Ozempic, we switched Ozempic to Trulicity due to insurance requirement   We discontinued glimepiride, continued metformin and Ozempic with an A1c of 6.8% in 04/2022  SUBJECTIVE:   During the last visit (05/24/2022): A1c 6.8%  Today (03/22/23): April Byrd is here for follow-up on diabetes management.  She has NOT been to our clinic in 10 months. She checks her blood sugars 0 times daily. She has the freestyle libre 2 but has not been using it. Needs a refill   She became a new grandmother recently with a baby boy  Feet tingling  is improving  Denies nausea, vomiting  Has once daily loose stools due to Metformin      HOME DIABETES REGIMEN: Metformin 500 mg 2 tabs daily  Ozempic 1 mg weekly    Statin: no ACE-I/ARB: yes    METER DOWNLOAD SUMMARY:did not bring    DIABETIC COMPLICATIONS: Microvascular complications:  Neuropathy Denies: CKD Last eye exam: Completed 2022 Q 2 yrs   Macrovascular complications:   Denies: CAD, PVD, CVA   PAST HISTORY: Past Medical History:  Past Medical History:  Diagnosis Date   Asthma    sports induced   BMI 37.0-37.9,adult 04/26/2012    Calculus of gallbladder with acute cholecystitis 04/26/2012   Calculus of gallbladder with acute cholecystitis, without mention of obstruction 04/26/2012   Type II or unspecified type diabetes mellitus without mention of complication, not stated as uncontrolled 04/26/2012   Unspecified asthma(493.90) 04/26/2012   Unspecified essential hypertension 04/26/2012   Past Surgical History:  Past Surgical History:  Procedure Laterality Date   CESAREAN SECTION     x 3   CHOLECYSTECTOMY N/A 04/25/2012   Procedure: LAPAROSCOPIC CHOLECYSTECTOMY WITH INTRAOPERATIVE CHOLANGIOGRAM;  Surgeon: Mariella Saa, MD;  Location: [facility];  Service: General;  Laterality: N/A;   hydrocephalus surgery     I & D EXTREMITY Right 01/06/2021   Procedure: IRRIGATION AND DEBRIDEMENT OF INDEX FINGER;  Surgeon: Bradly Bienenstock, MD;  Location: [facility];  Service: Orthopedics;  Laterality: Right;   TUBAL LIGATION      Social History:  reports that she has never smoked. She has never been exposed to tobacco smoke. She has never used smokeless tobacco. She reports current alcohol use. She reports that she does not use drugs. Family History:  Family History  Problem Relation Age of Onset   Diabetes Mother    Hypertension Mother    Kidney disease Father    Healthy Brother    Healthy Son    Healthy Son    Healthy Son      HOME MEDICATIONS: Allergies as of 03/22/2023  Reactions   Penicillins Other (See Comments), Rash   Has patient had a PCN reaction causing immediate rash, facial/tongue/throat swelling, SOB or lightheadedness with hypotension: Unknown Has patient had a PCN reaction causing severe rash involving mucus membranes or skin necrosis: Unknown Has patient had a PCN reaction that required hospitalization: Unknown Has patient had a PCN reaction occurring within the last 10 years: No If all of the above answers are "NO", then may proceed with Cephalosporin use. Has patient had a PCN reaction causing immediate rash,  facial/tongue/throat swelling, SOB or lightheadedness with hypotension: Unknown Has patient had a PCN reaction causing severe rash involving mucus membranes or skin necrosis: Unknown Has patient had a PCN reaction that required hospitalization: Unknown Has patient had a PCN reaction occurring within the last 10 years: No If all of the above answers are "NO", then may proceed with Cephalosporin use.        Medication List        Accurate as of March 22, 2023  7:12 AM. If you have any questions, ask your nurse or doctor.          Accu-Chek FastClix Lancets Misc USE AS DIRECTED ONCE DAILY TO  TEST  BLOOD  SUGAR DXE11.65   albuterol 108 (90 Base) MCG/ACT inhaler Commonly known as: VENTOLIN HFA Inhale 2 puffs into the lungs every 6 (six) hours as needed for wheezing or shortness of breath.   clindamycin 300 MG capsule Commonly known as: Cleocin Take 1 capsule (300 mg total) by mouth 3 (three) times daily.   gabapentin 300 MG capsule Commonly known as: NEURONTIN Take 1 capsule by mouth at bedtime   losartan 50 MG tablet Commonly known as: COZAAR Take 1 tablet (50 mg total) by mouth daily.   metFORMIN 500 MG tablet Commonly known as: GLUCOPHAGE Take 2 tablets (1,000 mg total) by mouth daily with breakfast.   Ozempic (1 MG/DOSE) 4 MG/3ML Sopn Generic drug: Semaglutide (1 MG/DOSE) Inject 1 mg into the skin once a week.         ALLERGIES: Allergies  Allergen Reactions   Penicillins Other (See Comments) and Rash    Has patient had a PCN reaction causing immediate rash, facial/tongue/throat swelling, SOB or lightheadedness with hypotension: Unknown Has patient had a PCN reaction causing severe rash involving mucus membranes or skin necrosis: Unknown Has patient had a PCN reaction that required hospitalization: Unknown Has patient had a PCN reaction occurring within the last 10 years: No If all of the above answers are "NO", then may proceed with Cephalosporin  use.  Has patient had a PCN reaction causing immediate rash, facial/tongue/throat swelling, SOB or lightheadedness with hypotension: Unknown Has patient had a PCN reaction causing severe rash involving mucus membranes or skin necrosis: Unknown Has patient had a PCN reaction that required hospitalization: Unknown Has patient had a PCN reaction occurring within the last 10 years: No If all of the above answers are "NO", then may proceed with Cephalosporin use.     REVIEW OF SYSTEMS: A comprehensive ROS was conducted with the patient and is negative except as per HPI     OBJECTIVE:   VITAL SIGNS: There were no vitals taken for this visit.   PHYSICAL EXAM:  General: Pt appears well and is in NAD  Neck: General: Supple without adenopathy or carotid bruits. Thyroid: Thyroid size normal.  No goiter or nodules appreciated.   Lungs: Clear with good BS bilat with no rales, rhonchi, or wheezes  Heart: RRR  Abdomen:  soft, nontender  Extremities:  Lower extremities - Trace pretibial edema  Neuro: MS is good with appropriate affect, pt is alert and Ox3    DM foot exam: 11/22/2021   The skin of the feet is intact without sores or ulcerations. The pedal pulses are 2+ on right and 2+ on left. The sensation is intact to a screening 5.07, 10 gram monofilament bilaterally  DATA REVIEWED:  Lab Results  Component Value Date   HGBA1C 6.8 (A) 05/24/2022   HGBA1C 6.4 (A) 11/22/2021   HGBA1C 9.8 (A) 02/16/2021    Latest Reference Range & Units 08/09/21 09:24  Alkaline Phosphatase 44 - 121 IU/L 95  Albumin 3.9 - 4.9 g/dL 3.9  AST 0 - 40 IU/L 19  ALT 0 - 32 IU/L 14  Total Protein 6.0 - 8.5 g/dL 7.4  Total Bilirubin 0.0 - 1.2 mg/dL 0.8  BILIRUBIN, DIRECT 0.00 - 0.40 mg/dL 4.09  Total CHOL/HDL Ratio 0.0 - 4.4 ratio 3.0  Cholesterol, Total 100 - 199 mg/dL 811  HDL Cholesterol >91 mg/dL 53  Triglycerides 0 - 478 mg/dL 295  VLDL Cholesterol Cal 5 - 40 mg/dL 19  LDL Chol Calc (NIH) 0 - 99  mg/dL 89    ASSESSMENT / PLAN / RECOMMENDATIONS:   1) Type 2 Diabetes Mellitus, Optimally controlled, With neuropathic  complications - Most recent A1c of 6.8 %. Goal A1c < 7.0 %.    - A1c continues to be at goal  - Encouraged glucose checks at home  - We discussed increasing Ozempic and stopping Glimepiride  - She is past due for labs , declined labs today as she needs to hydrate for 3 days prior to labs , she would like to have these done at PCP's    MEDICATIONS: Continue metformin 500 mg, 2 tabs daily Stop  glimepiride 2 mg daily Increase  Ozempic 1 mg weekly  EDUCATION / INSTRUCTIONS: BG monitoring instructions: Patient is instructed to check her blood sugars 3 times a day, before each meal. Call [facility] if: BG persistently < 70  I reviewed the Rule of 15 for the treatment of hypoglycemia in detail with the patient. Literature supplied.   2) Diabetic complications:  Eye: Does not have known diabetic retinopathy.  Neuro/ Feet: Does  have known diabetic peripheral neuropathy. Renal: Patient does not have known baseline CKD. She is not on an ACEI/ARB at present.      F/U in 6 months   Signed electronically by: Lyndle Herrlich, MD  [facility] Endocrinology  [facility] [address] [place], [place] [address] Phone: [phone] FAX: [phone]   CC: Rema Fendt, NP [address] [place] [address] Phone: [phone]  Fax: [phone]    Return to Endocrinology clinic as below: Future Appointments  Date Time Provider Department Center  03/22/2023 10:50 AM Cosette Prindle, Konrad Dolores, MD [facility] None

## 2023-03-23 ENCOUNTER — Ambulatory Visit: Payer: BLUE CROSS/BLUE SHIELD | Admitting: Internal Medicine

## 2023-03-23 NOTE — Progress Notes (Deleted)
 Name: April Byrd  MRN/ DOB: [id number], Mar 03, 1975   Age/ Sex: 48 y.o., female    PCP: Rema Fendt, NP   Reason for Endocrinology Evaluation: Type 2 Diabetes Mellitus     Date of Initial Endocrinology Visit: 11/22/2021    PATIENT IDENTIFIER: April Byrd is a 48 y.o. female with a past medical history of DM . The patient presented for initial endocrinology clinic visit on 11/22/2021 for consultative assistance with her diabetes management.    HPI: April Byrd was    Diagnosed with DM > 16 yrs ago  Prior Medications tried/Intolerance: Started on Insulin 2022 has been off insulin 07/2021. Started Ozempic 2023 Hemoglobin A1c has ranged from 9.8% in 2023, peaking at 12.8% in 2020.    On her initial visit to our clinic she had an A1c of 6.4%, she was on metformin, glimepiride, and Ozempic, we switched Ozempic to Trulicity due to insurance requirement   We discontinued glimepiride, continued metformin and Ozempic with an A1c of 6.8% in 04/2022  SUBJECTIVE:   During the last visit (05/24/2022): A1c 6.8%  Today (03/23/23): April Byrd is here for follow-up on diabetes management.  She has NOT been to our clinic in 10 months. She checks her blood sugars 0 times daily. She has the freestyle libre 2 but has not been using it. Needs a refill   She became a new grandmother recently with a baby boy  Feet tingling  is improving  Denies nausea, vomiting  Has once daily loose stools due to Metformin      HOME DIABETES REGIMEN: Metformin 500 mg 2 tabs daily  Ozempic 1 mg weekly    Statin: no ACE-I/ARB: yes    METER DOWNLOAD SUMMARY:did not bring    DIABETIC COMPLICATIONS: Microvascular complications:  Neuropathy Denies: CKD Last eye exam: Completed 2022 Q 2 yrs   Macrovascular complications:   Denies: CAD, PVD, CVA   PAST HISTORY: Past Medical History:  Past Medical History:  Diagnosis Date   Asthma    sports induced   BMI 37.0-37.9,adult 04/26/2012    Calculus of gallbladder with acute cholecystitis 04/26/2012   Calculus of gallbladder with acute cholecystitis, without mention of obstruction 04/26/2012   Type II or unspecified type diabetes mellitus without mention of complication, not stated as uncontrolled 04/26/2012   Unspecified asthma(493.90) 04/26/2012   Unspecified essential hypertension 04/26/2012   Past Surgical History:  Past Surgical History:  Procedure Laterality Date   CESAREAN SECTION     x 3   CHOLECYSTECTOMY N/A 04/25/2012   Procedure: LAPAROSCOPIC CHOLECYSTECTOMY WITH INTRAOPERATIVE CHOLANGIOGRAM;  Surgeon: Mariella Saa, MD;  Location: [facility];  Service: General;  Laterality: N/A;   hydrocephalus surgery     I & D EXTREMITY Right 01/06/2021   Procedure: IRRIGATION AND DEBRIDEMENT OF INDEX FINGER;  Surgeon: Bradly Bienenstock, MD;  Location: [facility];  Service: Orthopedics;  Laterality: Right;   TUBAL LIGATION      Social History:  reports that she has never smoked. She has never been exposed to tobacco smoke. She has never used smokeless tobacco. She reports current alcohol use. She reports that she does not use drugs. Family History:  Family History  Problem Relation Age of Onset   Diabetes Mother    Hypertension Mother    Kidney disease Father    Healthy Brother    Healthy Son    Healthy Son    Healthy Son      HOME MEDICATIONS: Allergies as of 03/23/2023  Reactions   Penicillins Other (See Comments), Rash   Has patient had a PCN reaction causing immediate rash, facial/tongue/throat swelling, SOB or lightheadedness with hypotension: Unknown Has patient had a PCN reaction causing severe rash involving mucus membranes or skin necrosis: Unknown Has patient had a PCN reaction that required hospitalization: Unknown Has patient had a PCN reaction occurring within the last 10 years: No If all of the above answers are "NO", then may proceed with Cephalosporin use. Has patient had a PCN reaction causing immediate rash,  facial/tongue/throat swelling, SOB or lightheadedness with hypotension: Unknown Has patient had a PCN reaction causing severe rash involving mucus membranes or skin necrosis: Unknown Has patient had a PCN reaction that required hospitalization: Unknown Has patient had a PCN reaction occurring within the last 10 years: No If all of the above answers are "NO", then may proceed with Cephalosporin use.        Medication List        Accurate as of March 23, 2023  6:58 AM. If you have any questions, ask your nurse or doctor.          Accu-Chek FastClix Lancets Misc USE AS DIRECTED ONCE DAILY TO  TEST  BLOOD  SUGAR DXE11.65   albuterol 108 (90 Base) MCG/ACT inhaler Commonly known as: VENTOLIN HFA Inhale 2 puffs into the lungs every 6 (six) hours as needed for wheezing or shortness of breath.   clindamycin 300 MG capsule Commonly known as: Cleocin Take 1 capsule (300 mg total) by mouth 3 (three) times daily.   gabapentin 300 MG capsule Commonly known as: NEURONTIN Take 1 capsule by mouth at bedtime   losartan 50 MG tablet Commonly known as: COZAAR Take 1 tablet (50 mg total) by mouth daily.   metFORMIN 500 MG tablet Commonly known as: GLUCOPHAGE Take 2 tablets (1,000 mg total) by mouth daily with breakfast.   Ozempic (1 MG/DOSE) 4 MG/3ML Sopn Generic drug: Semaglutide (1 MG/DOSE) Inject 1 mg into the skin once a week.         ALLERGIES: Allergies  Allergen Reactions   Penicillins Other (See Comments) and Rash    Has patient had a PCN reaction causing immediate rash, facial/tongue/throat swelling, SOB or lightheadedness with hypotension: Unknown Has patient had a PCN reaction causing severe rash involving mucus membranes or skin necrosis: Unknown Has patient had a PCN reaction that required hospitalization: Unknown Has patient had a PCN reaction occurring within the last 10 years: No If all of the above answers are "NO", then may proceed with Cephalosporin  use.  Has patient had a PCN reaction causing immediate rash, facial/tongue/throat swelling, SOB or lightheadedness with hypotension: Unknown Has patient had a PCN reaction causing severe rash involving mucus membranes or skin necrosis: Unknown Has patient had a PCN reaction that required hospitalization: Unknown Has patient had a PCN reaction occurring within the last 10 years: No If all of the above answers are "NO", then may proceed with Cephalosporin use.     REVIEW OF SYSTEMS: A comprehensive ROS was conducted with the patient and is negative except as per HPI     OBJECTIVE:   VITAL SIGNS: There were no vitals taken for this visit.   PHYSICAL EXAM:  General: Pt appears well and is in NAD  Neck: General: Supple without adenopathy or carotid bruits. Thyroid: Thyroid size normal.  No goiter or nodules appreciated.   Lungs: Clear with good BS bilat with no rales, rhonchi, or wheezes  Heart: RRR  Abdomen:  soft, nontender  Extremities:  Lower extremities - Trace pretibial edema  Neuro: MS is good with appropriate affect, pt is alert and Ox3    DM foot exam: 11/22/2021   The skin of the feet is intact without sores or ulcerations. The pedal pulses are 2+ on right and 2+ on left. The sensation is intact to a screening 5.07, 10 gram monofilament bilaterally  DATA REVIEWED:  Lab Results  Component Value Date   HGBA1C 6.8 (A) 05/24/2022   HGBA1C 6.4 (A) 11/22/2021   HGBA1C 9.8 (A) 02/16/2021    Latest Reference Range & Units 08/09/21 09:24  Alkaline Phosphatase 44 - 121 IU/L 95  Albumin 3.9 - 4.9 g/dL 3.9  AST 0 - 40 IU/L 19  ALT 0 - 32 IU/L 14  Total Protein 6.0 - 8.5 g/dL 7.4  Total Bilirubin 0.0 - 1.2 mg/dL 0.8  BILIRUBIN, DIRECT 0.00 - 0.40 mg/dL 1.61  Total CHOL/HDL Ratio 0.0 - 4.4 ratio 3.0  Cholesterol, Total 100 - 199 mg/dL 096  HDL Cholesterol >04 mg/dL 53  Triglycerides 0 - 540 mg/dL 981  VLDL Cholesterol Cal 5 - 40 mg/dL 19  LDL Chol Calc (NIH) 0 - 99  mg/dL 89    ASSESSMENT / PLAN / RECOMMENDATIONS:   1) Type 2 Diabetes Mellitus, Optimally controlled, With neuropathic  complications - Most recent A1c of 6.8 %. Goal A1c < 7.0 %.    - A1c continues to be at goal  - Encouraged glucose checks at home  - We discussed increasing Ozempic and stopping Glimepiride  - She is past due for labs , declined labs today as she needs to hydrate for 3 days prior to labs , she would like to have these done at PCP's    MEDICATIONS: Continue metformin 500 mg, 2 tabs daily Increase  Ozempic 1 mg weekly  EDUCATION / INSTRUCTIONS: BG monitoring instructions: Patient is instructed to check her blood sugars 3 times a day, before each meal. Call [facility] if: BG persistently < 70  I reviewed the Rule of 15 for the treatment of hypoglycemia in detail with the patient. Literature supplied.   2) Diabetic complications:  Eye: Does not have known diabetic retinopathy.  Neuro/ Feet: Does  have known diabetic peripheral neuropathy. Renal: Patient does not have known baseline CKD. She is not on an ACEI/ARB at present.      F/U in 6 months   Signed electronically by: Lyndle Herrlich, MD  [facility] Endocrinology  [facility] [address] [place], [place] [address] Phone: [phone] FAX: [phone]   CC: Rema Fendt, NP [address] [place] [address] Phone: [phone]  Fax: [phone]    Return to Endocrinology clinic as below: Future Appointments  Date Time Provider Department Center  03/23/2023  9:50 AM April Byrd, Konrad Dolores, MD [facility] None

## 2023-05-17 ENCOUNTER — Telehealth: Payer: Self-pay | Admitting: *Deleted

## 2023-05-17 NOTE — Telephone Encounter (Signed)
 Patient request HFU

## 2023-05-18 NOTE — Telephone Encounter (Signed)
 Keep upcoming appointment 07/18/2023. Schedule hospital discharge follow-up appointment.

## 2023-07-18 ENCOUNTER — Encounter: Payer: Self-pay | Admitting: Family

## 2023-07-18 ENCOUNTER — Ambulatory Visit (INDEPENDENT_AMBULATORY_CARE_PROVIDER_SITE_OTHER): Admitting: Family

## 2023-07-18 VITALS — BP 122/86 | HR 92 | Temp 98.2°F | Resp 16 | Ht 64.0 in | Wt 210.6 lb

## 2023-07-18 DIAGNOSIS — E1165 Type 2 diabetes mellitus with hyperglycemia: Secondary | ICD-10-CM | POA: Diagnosis not present

## 2023-07-18 DIAGNOSIS — Z1211 Encounter for screening for malignant neoplasm of colon: Secondary | ICD-10-CM

## 2023-07-18 DIAGNOSIS — Z7984 Long term (current) use of oral hypoglycemic drugs: Secondary | ICD-10-CM

## 2023-07-18 DIAGNOSIS — Z13220 Encounter for screening for lipoid disorders: Secondary | ICD-10-CM

## 2023-07-18 DIAGNOSIS — Z1231 Encounter for screening mammogram for malignant neoplasm of breast: Secondary | ICD-10-CM

## 2023-07-18 DIAGNOSIS — Z13228 Encounter for screening for other metabolic disorders: Secondary | ICD-10-CM

## 2023-07-18 DIAGNOSIS — Z1329 Encounter for screening for other suspected endocrine disorder: Secondary | ICD-10-CM | POA: Diagnosis not present

## 2023-07-18 DIAGNOSIS — I10 Essential (primary) hypertension: Secondary | ICD-10-CM

## 2023-07-18 DIAGNOSIS — Z7985 Long-term (current) use of injectable non-insulin antidiabetic drugs: Secondary | ICD-10-CM

## 2023-07-18 DIAGNOSIS — Z0000 Encounter for general adult medical examination without abnormal findings: Secondary | ICD-10-CM

## 2023-07-18 DIAGNOSIS — N926 Irregular menstruation, unspecified: Secondary | ICD-10-CM | POA: Diagnosis not present

## 2023-07-18 DIAGNOSIS — G4700 Insomnia, unspecified: Secondary | ICD-10-CM

## 2023-07-18 DIAGNOSIS — E119 Type 2 diabetes mellitus without complications: Secondary | ICD-10-CM

## 2023-07-18 DIAGNOSIS — Z130 Encounter for screening for diseases of the blood and blood-forming organs and certain disorders involving the immune mechanism: Secondary | ICD-10-CM

## 2023-07-18 MED ORDER — LOSARTAN POTASSIUM 50 MG PO TABS: 50.0000 mg | ORAL_TABLET | Freq: Every day | ORAL | 0 refills | Status: AC

## 2023-07-18 MED ORDER — OZEMPIC (1 MG/DOSE) 4 MG/3ML ~~LOC~~ SOPN: 1.0000 mg | PEN_INJECTOR | SUBCUTANEOUS | 0 refills | Status: DC

## 2023-07-18 MED ORDER — METFORMIN HCL 500 MG PO TABS: 1000.0000 mg | ORAL_TABLET | Freq: Every day | ORAL | 0 refills | Status: DC

## 2023-07-18 MED ORDER — TRAZODONE HCL 50 MG PO TABS: 25.0000 mg | ORAL_TABLET | Freq: Every evening | ORAL | 0 refills | Status: AC | PRN

## 2023-07-18 NOTE — Progress Notes (Signed)
 a1No concerns

## 2023-07-18 NOTE — Progress Notes (Signed)
 Patient ID: April Byrd, female    DOB: Mar 17, 1975  MRN: [id number]  CC: Annual Exam  Subjective: April Byrd is a 48 y.o. female who presents for annual exam.   Her concerns today include:  - Up to date on cervical cancer screening per [facility]. - Doing well on Losartan , no issues/concerns. She does not complain of red flag symptoms such as but not limited to chest pain, shortness of breath, worst headache of life, nausea/vomiting.  - Doing well on Metformin  and Semaglutide . States sometimes she scales back on Metformin  when causing upset stomach. Otherwise doing well on medication regimen, no issues/concerns. She is trying to watch what she eats. She does not exercise outside of normal routine. She denies red flag symptoms associated with diabetes. States she plans to schedule a diabetes follow-up appointment with established Endocrinology soon but wanted to be seen at Primary Care first. - Due for diabetic eye exam.  - Due for diabetic foot exam.  - States irregular periods and mood swings. She would like for her hormones to be checked and referred to Gynecology.  - Insomnia. Reports working 80 plus hours weekly at her job. Taking over-the-counter Benadryl with minimal relief.   Patient Active Problem List   Diagnosis Date Noted   Pain in right hand 02/25/2021   Microalbuminuria 06/29/2020   Elevated blood-pressure reading without diagnosis of hypertension 01/09/2020   Migraine without aura, not refractory 01/09/2020   Diabetes mellitus (HCC) 12/11/2018   Candidal vulvovaginitis 10/10/2018   Abnormal uterine bleeding 10/10/2018   Osteoarthritis of feet, bilateral 02/12/2018   Primary osteoarthritis of both knees 02/12/2018   Osteoarthritis of both hands 02/12/2018   Asthma 04/26/2012   Obesity with body mass index 30 or greater 04/26/2012     Current Outpatient Medications on File Prior to Visit  Medication Sig Dispense Refill   albuterol  (VENTOLIN  HFA) 108 (90  Base) MCG/ACT inhaler Inhale 2 puffs into the lungs every 6 (six) hours as needed for wheezing or shortness of breath. 8 g 1   gabapentin  (NEURONTIN ) 300 MG capsule Take 1 capsule by mouth at bedtime 90 capsule 2   Accu-Chek FastClix Lancets MISC USE AS DIRECTED ONCE DAILY TO  TEST  BLOOD  SUGAR DXE11.65 (Patient not taking: Reported on 07/18/2023) 102 each 0   clindamycin  (CLEOCIN ) 300 MG capsule Take 1 capsule (300 mg total) by mouth 3 (three) times daily. 21 capsule 0   No current facility-administered medications on file prior to visit.    Allergies  Allergen Reactions   Penicillins Other (See Comments) and Rash    Has patient had a PCN reaction causing immediate rash, facial/tongue/throat swelling, SOB or lightheadedness with hypotension: Unknown Has patient had a PCN reaction causing severe rash involving mucus membranes or skin necrosis: Unknown Has patient had a PCN reaction that required hospitalization: Unknown Has patient had a PCN reaction occurring within the last 10 years: No If all of the above answers are NO, then may proceed with Cephalosporin use.  Has patient had a PCN reaction causing immediate rash, facial/tongue/throat swelling, SOB or lightheadedness with hypotension: Unknown Has patient had a PCN reaction causing severe rash involving mucus membranes or skin necrosis: Unknown Has patient had a PCN reaction that required hospitalization: Unknown Has patient had a PCN reaction occurring within the last 10 years: No If all of the above answers are NO, then may proceed with Cephalosporin use.    Social History   Socioeconomic History   Marital status:  Single    Spouse name: Not on file   Number of children: Not on file   Years of education: Not on file   Highest education level: Not on file  Occupational History   Not on file  Tobacco Use   Smoking status: Never    Passive exposure: Never   Smokeless tobacco: Never  Vaping Use   Vaping status: Never Used   Substance and Sexual Activity   Alcohol use: Yes    Comment: social    Drug use: No   Sexual activity: Yes    Birth control/protection: Condom, Surgical  Other Topics Concern   Not on file  Social History Narrative   Not on file   Social Drivers of Health   Financial Resource Strain: Low Risk  (07/18/2023)   Overall Financial Resource Strain (CARDIA)    Difficulty of Paying Living Expenses: Not hard at all  Food Insecurity: No Food Insecurity (07/18/2023)   Hunger Vital Sign    Worried About Running Out of Food in the Last Year: Never true    Ran Out of Food in the Last Year: Never true  Transportation Needs: Unmet Transportation Needs (07/18/2023)   PRAPARE - Transportation    Lack of Transportation (Medical): Yes    Lack of Transportation (Non-Medical): Yes  Physical Activity: Sufficiently Active (07/18/2023)   Exercise Vital Sign    Days of Exercise per Week: 7 days    Minutes of Exercise per Session: 30 min  Stress: No Stress Concern Present (07/18/2023)   [employer] of Occupational Health - Occupational Stress Questionnaire    Feeling of Stress: Not at all  Social Connections: Moderately Isolated (07/18/2023)   Social Connection and Isolation Panel    Frequency of Communication with Friends and Family: More than three times a week    Frequency of Social Gatherings with Friends and Family: More than three times a week    Attends Religious Services: More than 4 times per year    Active Member of [employer] or Organizations: No    Attends Banker Meetings: Never    Marital Status: Divorced  Catering manager Violence: Not At Risk (07/18/2023)   Humiliation, Afraid, Rape, and Kick questionnaire    Fear of Current or Ex-Partner: No    Emotionally Abused: No    Physically Abused: No    Sexually Abused: No    Family History  Problem Relation Age of Onset   Diabetes Mother    Hypertension Mother    Kidney disease Father    Healthy Brother    Healthy Son     Healthy Son    Healthy Son     Past Surgical History:  Procedure Laterality Date   CESAREAN SECTION     x 3   CHOLECYSTECTOMY N/A 04/25/2012   Procedure: LAPAROSCOPIC CHOLECYSTECTOMY WITH INTRAOPERATIVE CHOLANGIOGRAM;  Surgeon: Morene ONEIDA Olives, MD;  Location: [facility];  Service: General;  Laterality: N/A;   hydrocephalus surgery     I & D EXTREMITY Right 01/06/2021   Procedure: IRRIGATION AND DEBRIDEMENT OF INDEX FINGER;  Surgeon: Shari Easter, MD;  Location: [facility];  Service: Orthopedics;  Laterality: Right;   TUBAL LIGATION      ROS: Review of Systems Negative except as stated above  PHYSICAL EXAM: BP 122/86   Pulse 92   Temp 98.2 F (36.8 C) (Oral)   Resp 16   Ht 5' 4 (1.626 m)   Wt 210 lb 9.6 oz (95.5 kg)   LMP  07/10/2023 (Approximate)   SpO2 96%   BMI 36.15 kg/m   Physical Exam HENT:     Head: Normocephalic and atraumatic.     Right Ear: Tympanic membrane, ear canal and external ear normal.     Left Ear: Tympanic membrane, ear canal and external ear normal.     Nose: Nose normal.     Mouth/Throat:     Mouth: Mucous membranes are moist.     Pharynx: Oropharynx is clear.   Eyes:     Extraocular Movements: Extraocular movements intact.     Conjunctiva/sclera: Conjunctivae normal.     Pupils: Pupils are equal, round, and reactive to light.   Neck:     Thyroid : No thyroid  mass, thyromegaly or thyroid  tenderness.   Cardiovascular:     Rate and Rhythm: Normal rate and regular rhythm.     Pulses: Normal pulses.     Heart sounds: Normal heart sounds.  Pulmonary:     Effort: Pulmonary effort is normal.     Breath sounds: Normal breath sounds.  Chest:     Comments: Patient declined. Abdominal:     General: Bowel sounds are normal.     Palpations: Abdomen is soft.  Genitourinary:    Comments: Patient declined.  Musculoskeletal:        General: Normal range of motion.     Right shoulder: Normal.     Left shoulder: Normal.     Right upper arm: Normal.      Left upper arm: Normal.     Right elbow: Normal.     Left elbow: Normal.     Right forearm: Normal.     Left forearm: Normal.     Right wrist: Normal.     Left wrist: Normal.     Right hand: Normal.     Left hand: Normal.     Cervical back: Normal, normal range of motion and neck supple.     Thoracic back: Normal.     Lumbar back: Normal.     Right hip: Normal.     Left hip: Normal.     Right upper leg: Normal.     Left upper leg: Normal.     Right knee: Normal.     Left knee: Normal.     Right lower leg: Normal.     Left lower leg: Normal.     Right ankle: Normal.     Left ankle: Normal.     Right foot: Normal.     Left foot: Normal.   Skin:    General: Skin is warm and dry.     Capillary Refill: Capillary refill takes less than 2 seconds.   Neurological:     General: No focal deficit present.     Mental Status: She is alert and oriented to person, place, and time.   Psychiatric:        Mood and Affect: Mood normal.        Behavior: Behavior normal.     ASSESSMENT AND PLAN: 1. Annual physical exam (Primary) - Counseled on 150 minutes of exercise per week as tolerated, healthy eating (including decreased daily intake of saturated fats, cholesterol, added sugars, sodium), STI prevention, and routine healthcare maintenance.  2. Screening for metabolic disorder - Routine screening.  - CMP14+EGFR  3. Screening for deficiency anemia - Routine screening.  - CBC  4. Screening cholesterol level - Routine screening.  - Lipid panel  5. Thyroid  disorder screen - Routine screening.  - TSH  6.  Encounter for screening mammogram for malignant neoplasm of breast - Routine screening.  - MM Digital Screening; Future  7. Irregular menses - Routine screening.  - Referral to Gynecology for evaluation/management. - Follicle stimulating hormone - Luteinizing hormone - Ambulatory referral to Gynecology - hCG, serum, qualitative  8. Colon cancer screening - Referral to  Gastroenterology for colon cancer screening by colonoscopy. - Ambulatory referral to Gastroenterology  9. Primary hypertension - Continue Losartan  as prescribed.  - Counseled on blood pressure goal of less than 130/80, low-sodium, DASH diet, medication compliance, and 150 minutes of moderate intensity exercise per week as tolerated. Counseled on medication adherence and adverse effects. - Follow-up with primary provider in 3 months or sooner if needed. - losartan  (COZAAR ) 50 MG tablet; Take 1 tablet (50 mg total) by mouth daily.  Dispense: 90 tablet; Refill: 0  10. Type 2 diabetes mellitus with hyperglycemia, without long-term current use of insulin  (HCC) - Continue Metformin  and Semaglutide  as prescribed.  - Hemoglobin A1c result pending.  - Routine screening.  - Discussed the importance of healthy eating habits, low-carbohydrate diet, low-sugar diet, regular aerobic exercise (at least 150 minutes a week as tolerated) and medication compliance to achieve or maintain control of diabetes. Counseled on medication adherence/adverse effects.  - Follow-up with primary provider as scheduled. - Microalbumin / creatinine urine ratio - Hemoglobin A1c - metFORMIN  (GLUCOPHAGE ) 500 MG tablet; Take 2 tablets (1,000 mg total) by mouth daily with breakfast.  Dispense: 180 tablet; Refill: 0 - Semaglutide , 1 MG/DOSE, (OZEMPIC , 1 MG/DOSE,) 4 MG/3ML SOPN; Inject 1 mg into the skin once a week.  Dispense: 9 mL; Refill: 0  11. Diabetic eye exam [facility]) - Referral to Ophthalmology for evaluation/management. - Ambulatory referral to Ophthalmology  12. Encounter for diabetic foot exam [facility]) - Referral to Podiatry for evaluation/management. - Ambulatory referral to Podiatry  13. Insomnia, unspecified type - Trazodone as prescribed. Counseled on medication adherence/adverse effects.  - Follow-up with primary provider in 4 weeks or sooner if needed. - traZODone (DESYREL) 50 MG tablet; Take 0.5-1 tablets (25-50 mg  total) by mouth at bedtime as needed for sleep.  Dispense: 90 tablet; Refill: 0    Patient was given the opportunity to ask questions.  Patient verbalized understanding of the plan and was able to repeat key elements of the plan. Patient was given clear instructions to go to Emergency Department or return to medical center if symptoms don't improve, worsen, or new problems develop.The patient verbalized understanding.   Orders Placed This Encounter  Procedures   MM Digital Screening   Microalbumin / creatinine urine ratio   CBC   Lipid panel   CMP14+EGFR   Hemoglobin A1c   TSH   Follicle stimulating hormone   Luteinizing hormone   hCG, serum, qualitative   Ambulatory referral to Podiatry   Ambulatory referral to Ophthalmology   Ambulatory referral to Gastroenterology   Ambulatory referral to Gynecology     Requested Prescriptions   Signed Prescriptions Disp Refills   losartan  (COZAAR ) 50 MG tablet 90 tablet 0    Sig: Take 1 tablet (50 mg total) by mouth daily.   metFORMIN  (GLUCOPHAGE ) 500 MG tablet 180 tablet 0    Sig: Take 2 tablets (1,000 mg total) by mouth daily with breakfast.   Semaglutide , 1 MG/DOSE, (OZEMPIC , 1 MG/DOSE,) 4 MG/3ML SOPN 9 mL 0    Sig: Inject 1 mg into the skin once a week.   traZODone (DESYREL) 50 MG tablet 90 tablet 0  Sig: Take 0.5-1 tablets (25-50 mg total) by mouth at bedtime as needed for sleep.    Return in about 1 year (around 07/17/2024) for Physical per patient preference.  Greig JINNY Drones, NP

## 2023-07-19 ENCOUNTER — Ambulatory Visit: Payer: Self-pay | Admitting: Family

## 2023-07-19 DIAGNOSIS — E1165 Type 2 diabetes mellitus with hyperglycemia: Secondary | ICD-10-CM

## 2023-07-19 DIAGNOSIS — Z3200 Encounter for pregnancy test, result unknown: Secondary | ICD-10-CM

## 2023-07-19 LAB — CBC
Hematocrit: 40.4 % (ref 34.0–46.6)
Hemoglobin: 12.9 g/dL (ref 11.1–15.9)
MCH: 27.2 pg (ref 26.6–33.0)
MCHC: 31.9 g/dL (ref 31.5–35.7)
MCV: 85 fL (ref 79–97)
Platelets: 236 10*3/uL (ref 150–450)
RBC: 4.74 x10E6/uL (ref 3.77–5.28)
RDW: 14 % (ref 11.7–15.4)
WBC: 4.5 10*3/uL (ref 3.4–10.8)

## 2023-07-19 LAB — CMP14+EGFR
ALT: 13 IU/L (ref 0–32)
AST: 22 IU/L (ref 0–40)
Albumin: 4.1 g/dL (ref 3.9–4.9)
Alkaline Phosphatase: 84 IU/L (ref 44–121)
BUN/Creatinine Ratio: 13 (ref 9–23)
BUN: 10 mg/dL (ref 6–24)
Bilirubin Total: 0.8 mg/dL (ref 0.0–1.2)
CO2: 20 mmol/L (ref 20–29)
Calcium: 8.9 mg/dL (ref 8.7–10.2)
Chloride: 98 mmol/L (ref 96–106)
Creatinine, Ser: 0.78 mg/dL (ref 0.57–1.00)
Globulin, Total: 3.2 g/dL (ref 1.5–4.5)
Glucose: 86 mg/dL (ref 70–99)
Potassium: 4 mmol/L (ref 3.5–5.2)
Sodium: 137 mmol/L (ref 134–144)
Total Protein: 7.3 g/dL (ref 6.0–8.5)
eGFR: 94 mL/min/{1.73_m2} (ref >59)

## 2023-07-19 LAB — TSH: TSH: 0.81 u[IU]/mL (ref 0.450–4.500)

## 2023-07-19 LAB — LIPID PANEL
Chol/HDL Ratio: 2.4 ratio (ref 0.0–4.4)
Cholesterol, Total: 159 mg/dL (ref 100–199)
HDL: 67 mg/dL (ref >39)
LDL Chol Calc (NIH): 77 mg/dL (ref 0–99)
Triglycerides: 77 mg/dL (ref 0–149)
VLDL Cholesterol Cal: 15 mg/dL (ref 5–40)

## 2023-07-19 LAB — HEMOGLOBIN A1C
Est. average glucose Bld gHb Est-mCnc: 163 mg/dL
Hgb A1c MFr Bld: 7.3 % — ABNORMAL HIGH (ref 4.8–5.6)

## 2023-07-19 LAB — FOLLICLE STIMULATING HORMONE: FSH: 11.1 m[IU]/mL

## 2023-07-19 LAB — LUTEINIZING HORMONE: LH: 13 m[IU]/mL

## 2023-07-19 LAB — MICROALBUMIN / CREATININE URINE RATIO
Creatinine, Urine: 282.9 mg/dL
Microalb/Creat Ratio: 295 mg/g{creat} — ABNORMAL HIGH (ref 0–29)
Microalbumin, Urine: 834.6 ug/mL

## 2023-07-19 MED ORDER — SEMAGLUTIDE (1 MG/DOSE) 2 MG/1.5ML ~~LOC~~ SOPN: 2.0000 mg | PEN_INJECTOR | SUBCUTANEOUS | 0 refills | Status: AC

## 2023-08-01 LAB — HCG, SERUM, QUALITATIVE

## 2023-08-01 LAB — SPECIMEN STATUS REPORT

## 2023-08-09 ENCOUNTER — Ambulatory Visit

## 2023-08-18 ENCOUNTER — Ambulatory Visit
Admission: EM | Admit: 2023-08-18 | Discharge: 2023-08-18 | Disposition: A | Discharge status: Home / Self Care | Attending: Emergency Medicine | Admitting: Emergency Medicine

## 2023-08-18 ENCOUNTER — Ambulatory Visit: Payer: Self-pay

## 2023-08-18 DIAGNOSIS — M069 Rheumatoid arthritis, unspecified: Secondary | ICD-10-CM | POA: Diagnosis not present

## 2023-08-18 MED ORDER — PREDNISONE 10 MG (21) PO TBPK: ORAL_TABLET | Freq: Every day | ORAL | 0 refills | Status: AC

## 2023-08-18 NOTE — Telephone Encounter (Signed)
 FYI Only or Action Required?: FYI only for provider.  Patient was last seen in primary care on 07/18/2023 by Lorren Greig PARAS, NP.  Called Nurse Triage reporting Arthritis.  Symptoms began several days ago.  Interventions attempted: OTC medications: advil .  Symptoms are: gradually worsening.  Triage Disposition: See HCP Within 4 Hours (Or PCP Triage)  Patient/caregiver understands and will follow disposition?: Yes     Copied from CRM #[id number]. Topic: Clinical - Red Word Triage >> Aug 18, 2023  8:27 AM April Byrd wrote: Red Word that prompted transfer to Nurse Triage: Severe arthritis pain Reason for Disposition  [1] SEVERE pain (e.g., excruciating, unable to use hand at all) AND [2] not improved after 2 hours of pain medicine  Answer Assessment - Initial Assessment Questions 1. ONSET: When did the pain start?     Sunday 2. LOCATION: Where is the pain located?     Bilateral hands 3. PAIN: How bad is the pain? (Scale 1-10; or mild, moderate, severe)   - MILD (1-3): doesn't interfere with normal activities   - MODERATE (4-7): interferes with normal activities (e.g., work or school) or awakens from sleep   - SEVERE (8-10): excruciating pain, unable to use hand at all     severe 4. WORK OR EXERCISE: Has there been any recent work or exercise that involved this part (i.e., hand or wrist) of the body?     no 5. CAUSE: What do you think is causing the pain?     arthritis 6. AGGRAVATING FACTORS: What makes the pain worse? (e.g., using computer)     Moving them  7. OTHER SYMPTOMS: Do you have any other symptoms? (e.g., neck pain, swelling, rash, numbness, fever)     Swelling hands are double the size  Protocols used: Hand and Wrist Pain-A-AH

## 2023-08-18 NOTE — ED Triage Notes (Signed)
 Arthritis acting up since Sunday. C/o pain in both hands. States she hasn't had a flare-up in a while but had to do repetitive movements at work which she believes aggravated it.

## 2023-08-18 NOTE — Discharge Instructions (Addendum)
 Start the steroid taper tomorrow with breakfast.  Take this as prescribed and until finished.  You can take Tylenol  as needed for any breakthrough pain, 500 mg every 8 hours.  Try not to use your hands over the next few days and let the area rest and recuperate.  Follow-up with your primary care provider for any continued symptoms.

## 2023-08-18 NOTE — ED Provider Notes (Signed)
 [facility]    CSN: [id number] Arrival date & time: 08/18/23  1531      History   Chief Complaint Chief Complaint  Patient presents with   Hand Pain    HPI April Byrd is a 48 y.o. female.   Patient presents to clinic over concern of swelling and pain in her bilateral hands.  She does have a history of rheumatoid arthritis, osteoarthritis of her feet, and primary arthritis of both knees.  She tried putting her hands on frozen water bottles last night.  She does work a lot with her hands and had trouble completing her ADLs today.  Is unable to turn doorknobs or get herself dressed due to the pain and swelling.  Has not had any recent trauma or falls.  The history is provided by the patient and medical records.  Hand Pain    Past Medical History:  Diagnosis Date   Asthma    sports induced   BMI 37.0-37.9,adult 04/26/2012   Calculus of gallbladder with acute cholecystitis 04/26/2012   Calculus of gallbladder with acute cholecystitis, without mention of obstruction 04/26/2012   Type II or unspecified type diabetes mellitus without mention of complication, not stated as uncontrolled 04/26/2012   Unspecified asthma(493.90) 04/26/2012   Unspecified essential hypertension 04/26/2012    Patient Active Problem List   Diagnosis Date Noted   Pain in right hand 02/25/2021   Microalbuminuria 06/29/2020   Elevated blood-pressure reading without diagnosis of hypertension 01/09/2020   Migraine without aura, not refractory 01/09/2020   Diabetes mellitus (HCC) 12/11/2018   Candidal vulvovaginitis 10/10/2018   Abnormal uterine bleeding 10/10/2018   Osteoarthritis of feet, bilateral 02/12/2018   Primary osteoarthritis of both knees 02/12/2018   Osteoarthritis of both hands 02/12/2018   Asthma 04/26/2012   Obesity with body mass index 30 or greater 04/26/2012    Past Surgical History:  Procedure Laterality Date   CESAREAN SECTION     x 3   CHOLECYSTECTOMY N/A 04/25/2012    Procedure: LAPAROSCOPIC CHOLECYSTECTOMY WITH INTRAOPERATIVE CHOLANGIOGRAM;  Surgeon: Morene ONEIDA Olives, MD;  Location: [facility];  Service: General;  Laterality: N/A;   hydrocephalus surgery     I & D EXTREMITY Right 01/06/2021   Procedure: IRRIGATION AND DEBRIDEMENT OF INDEX FINGER;  Surgeon: Shari Easter, MD;  Location: [facility];  Service: Orthopedics;  Laterality: Right;   TUBAL LIGATION      OB History     Gravida  6   Para  3   Term  3   Preterm      AB  3   Living  3      SAB  3   IAB      Ectopic      Multiple      Live Births           Obstetric Comments  c-section x 3.           Home Medications    Prior to Admission medications   Medication Sig Start Date End Date Taking? Authorizing Provider  gabapentin  (NEURONTIN ) 300 MG capsule Take 1 capsule by mouth at bedtime 11/05/21  Yes Lorren, Amy J, NP  losartan  (COZAAR ) 50 MG tablet Take 1 tablet (50 mg total) by mouth daily. 07/18/23  Yes Lorren, Amy J, NP  metFORMIN  (GLUCOPHAGE ) 500 MG tablet Take 2 tablets (1,000 mg total) by mouth daily with breakfast. 07/18/23  Yes Lorren, Amy J, NP  predniSONE  (STERAPRED UNI-PAK 21 TAB) 10 MG (21) TBPK  tablet Take by mouth daily. Take 6 tabs by mouth daily  for 2 days, then 5 tabs for 2 days, then 4 tabs for 2 days, then 3 tabs for 2 days, 2 tabs for 2 days, then 1 tab by mouth daily for 2 days 08/18/23  Yes Taliya Mcclard  N, FNP  Semaglutide , 1 MG/DOSE, 2 MG/1.5ML SOPN Inject 2 mg into the skin once a week. 07/19/23  Yes Lorren, Amy J, NP  traZODone  (DESYREL ) 50 MG tablet Take 0.5-1 tablets (25-50 mg total) by mouth at bedtime as needed for sleep. 07/18/23  Yes Lorren Greig PARAS, NP  Accu-Chek FastClix Lancets MISC USE AS DIRECTED ONCE DAILY TO  TEST  BLOOD  SUGAR DXE11.65 Patient not taking: Reported on 07/18/2023 07/05/19   Fulp, Lannie, MD  albuterol  (VENTOLIN  HFA) 108 (90 Base) MCG/ACT inhaler Inhale 2 puffs into the lungs every 6 (six) hours as needed for  wheezing or shortness of breath. 11/19/22   Gandhi, Safal, PA-C  clindamycin  (CLEOCIN ) 300 MG capsule Take 1 capsule (300 mg total) by mouth 3 (three) times daily. 12/19/22   Vivienne Delon HERO, PA-C  Semaglutide , 1 MG/DOSE, (OZEMPIC , 1 MG/DOSE,) 4 MG/3ML SOPN Inject 1 mg into the skin once a week. 07/18/23   Lorren Greig PARAS, NP    Family History Family History  Problem Relation Age of Onset   Diabetes Mother    Hypertension Mother    Kidney disease Father    Healthy Brother    Healthy Son    Healthy Son    Healthy Son     Social History Social History   Tobacco Use   Smoking status: Never    Passive exposure: Never   Smokeless tobacco: Never  Vaping Use   Vaping status: Never Used  Substance Use Topics   Alcohol use: Yes    Comment: social    Drug use: No     Allergies   Penicillins   Review of Systems Review of Systems  Per HPI  Physical Exam Triage Vital Signs ED Triage Vitals  Encounter Vitals Group     BP 08/18/23 1546 125/85     Girls Systolic BP Percentile --      Girls Diastolic BP Percentile --      Boys Systolic BP Percentile --      Boys Diastolic BP Percentile --      Pulse Rate 08/18/23 1546 85     Resp 08/18/23 1546 16     Temp 08/18/23 1546 98 F (36.7 C)     Temp Source 08/18/23 1546 Oral     SpO2 08/18/23 1546 95 %     Weight --      Height --      Head Circumference --      Peak Flow --      Pain Score 08/18/23 1542 9     Pain Loc --      Pain Education --      Exclude from Growth Chart --    No data found.  Updated Vital Signs BP 125/85 (BP Location: Left Arm)   Pulse 85   Temp 98 F (36.7 C) (Oral)   Resp 16   LMP 08/17/2023   SpO2 95%   Visual Acuity Right Eye Distance:   Left Eye Distance:   Bilateral Distance:    Right Eye Near:   Left Eye Near:    Bilateral Near:     Physical Exam Vitals and nursing note reviewed.  Constitutional:  Appearance: Normal appearance.  HENT:     Head: Normocephalic and  atraumatic.     Right Ear: External ear normal.     Left Ear: External ear normal.     Nose: Nose normal.     Mouth/Throat:     Mouth: Mucous membranes are moist.  Eyes:     Conjunctiva/sclera: Conjunctivae normal.  Cardiovascular:     Rate and Rhythm: Normal rate.  Pulmonary:     Effort: Pulmonary effort is normal. No respiratory distress.  Musculoskeletal:        General: Swelling and tenderness present. No deformity or signs of injury.  Skin:    General: Skin is warm and dry.     Capillary Refill: Capillary refill takes less than 2 seconds.  Neurological:     General: No focal deficit present.     Mental Status: She is alert and oriented to person, place, and time.  Psychiatric:        Mood and Affect: Mood normal.        Behavior: Behavior normal.      UC Treatments / Results  Labs (all labs ordered are listed, but only abnormal results are displayed) Labs Reviewed - No data to display  EKG   Radiology No results found.  Procedures Procedures (including critical care time)  Medications Ordered in UC Medications - No data to display  Initial Impression / Assessment and Plan / UC Course  I have reviewed the triage vital signs and the nursing notes.  Pertinent labs & imaging results that were available during my care of the patient were reviewed by me and considered in my medical decision making (see chart for details).  Vitals and triage reviewed, patient is hemodynamically stable.  Bilateral hands with mild swelling and limitations in range of motion, diminished grip strength.  Diffuse tenderness to palpation.  Presentation consistent with previous RA flares.  Will treat with prolonged steroid taper.  Pain management discussed.  Offered IM steroid in clinic, as patient is unable to pick up her oral steroids until tomorrow, patient declined.  Plan of care, follow-up care return precautions given, no questions at this time.    Final Clinical Impressions(s) / UC  Diagnoses   Final diagnoses:  Rheumatoid arthritis flare (HCC)     Discharge Instructions      Start the steroid taper tomorrow with breakfast.  Take this as prescribed and until finished.  You can take Tylenol  as needed for any breakthrough pain, 500 mg every 8 hours.  Try not to use your hands over the next few days and let the area rest and recuperate.  Follow-up with your primary care provider for any continued symptoms.    ED Prescriptions     Medication Sig Dispense Auth. Provider   predniSONE  (STERAPRED UNI-PAK 21 TAB) 10 MG (21) TBPK tablet Take by mouth daily. Take 6 tabs by mouth daily  for 2 days, then 5 tabs for 2 days, then 4 tabs for 2 days, then 3 tabs for 2 days, 2 tabs for 2 days, then 1 tab by mouth daily for 2 days 42 tablet Dreama, Noah Pelaez  N, FNP      PDMP not reviewed this encounter.   Dreama, Chayton Murata  N, FNP 08/18/23 [phone]

## 2023-08-28 NOTE — Telephone Encounter (Signed)
 Patient to be called by scheduler for appt

## 2023-09-20 ENCOUNTER — Telehealth: Admitting: Physician Assistant

## 2023-09-20 DIAGNOSIS — K047 Periapical abscess without sinus: Secondary | ICD-10-CM

## 2023-09-20 DIAGNOSIS — K0889 Other specified disorders of teeth and supporting structures: Secondary | ICD-10-CM | POA: Diagnosis not present

## 2023-09-20 MED ORDER — CLINDAMYCIN HCL 300 MG PO CAPS: 300.0000 mg | ORAL_CAPSULE | Freq: Three times a day (TID) | ORAL | 0 refills | Status: DC

## 2023-09-20 MED ORDER — NAPROXEN 500 MG PO TABS: 500.0000 mg | ORAL_TABLET | Freq: Two times a day (BID) | ORAL | 0 refills | Status: DC

## 2023-09-20 NOTE — Addendum Note (Signed)
 Addended by: GLADIS ELSIE BROCKS on: 09/20/2023 02:09 PM   Modules accepted: Orders

## 2023-09-20 NOTE — Progress Notes (Addendum)
 E-Visit for Dental Pain  Thank you for clarifying that.   We are sorry that you are not feeling well.  Here is how we plan to help!  Based on what you have shared with me in the questionnaire, it sounds like you have a dental infection. I have prescribed Clindamycin  300mg  3 times a day for 7 days and Naprosyn  500mg  2 times a day for 7 days for discomfort  It is imperative that you see a dentist within 10 days of this eVisit to determine the cause of the dental pain and be sure it is adequately treated  A toothache or tooth pain is caused when the nerve in the root of a tooth or surrounding a tooth is irritated. Dental (tooth) infection, decay, injury, or loss of a tooth are the most common causes of dental pain. Pain may also occur after an extraction (tooth is pulled out). Pain sometimes originates from other areas and radiates to the jaw, thus appearing to be tooth pain.Bacteria growing inside your mouth can contribute to gum disease and dental decay, both of which can cause pain. A toothache occurs from inflammation of the central portion of the tooth called pulp. The pulp contains nerve endings that are very sensitive to pain. Inflammation to the pulp or pulpitis may be caused by dental cavities, trauma, and infection.    HOME CARE:   For toothaches: Over-the-counter pain medications such as acetaminophen  or ibuprofen  may be used. Take these as directed on the package while you arrange for a dental appointment. Avoid very cold or hot foods, because they may make the pain worse. You may get relief from biting on a cotton ball soaked in oil of cloves. You can get oil of cloves at most drug stores.  For jaw pain:  Aspirin may be helpful for problems in the joint of the jaw in adults. If pain happens every time you open your mouth widely, the temporomandibular joint (TMJ) may be the source of the pain. Yawning or taking a large bite of food may worsen the pain. An appointment with your doctor  or dentist will help you find the cause.     GET HELP RIGHT AWAY IF:  You have a high fever or chills If you have had a recent head or face injury and develop headache, light headedness, nausea, vomiting, or other symptoms that concern you after an injury to your face or mouth, you could have a more serious injury in addition to your dental injury. A facial rash associated with a toothache: This condition may improve with medication. Contact your doctor for them to decide what is appropriate. Any jaw pain occurring with chest pain: Although jaw pain is most commonly caused by dental disease, it is sometimes referred pain from other areas. People with heart disease, especially people who have had stents placed, people with diabetes, or those who have had heart surgery may have jaw pain as a symptom of heart attack or angina. If your jaw or tooth pain is associated with lightheadedness, sweating, or shortness of breath, you should see a doctor as soon as possible. Trouble swallowing or excessive pain or bleeding from gums: If you have a history of a weakened immune system, diabetes, or steroid use, you may be more susceptible to infections. Infections can often be more severe and extensive or caused by unusual organisms. Dental and gum infections in people with these conditions may require more aggressive treatment. An abscess may need draining or IV antibiotics, for  example.  MAKE SURE YOU   Understand these instructions. Will watch your condition. Will get help right away if you are not doing well or get worse.  Thank you for choosing an e-visit.  Your e-visit answers were reviewed by a board certified advanced clinical practitioner to complete your personal care plan. Depending upon the condition, your plan could have included both over the counter or prescription medications.  Please review your pharmacy choice. Make sure the pharmacy is open so you can pick up prescription now. If there is a  problem, you may contact your provider through [employer] and have the prescription routed to another pharmacy.  Your safety is important to us . If you have drug allergies check your prescription carefully.   For the next 24 hours you can use MyChart to ask questions about today's visit, request a non-urgent call back, or ask for a work or school excuse. You will get an email in the next two days asking about your experience. I hope that your e-visit has been valuable and will speed your recovery.

## 2023-09-20 NOTE — Progress Notes (Signed)
 I have spent 5 minutes in review of e-visit questionnaire, review and updating patient chart, medical decision making and response to patient.   Elsie Velma Lunger, PA-C

## 2023-09-20 NOTE — Addendum Note (Signed)
 Addended by: GLADIS ELSIE BROCKS on: 09/20/2023 07:16 AM   Modules accepted: Orders, Level of Service

## 2023-10-16 ENCOUNTER — Other Ambulatory Visit: Payer: Self-pay | Admitting: Family

## 2023-10-16 DIAGNOSIS — E1165 Type 2 diabetes mellitus with hyperglycemia: Secondary | ICD-10-CM

## 2023-10-16 NOTE — Telephone Encounter (Signed)
 Complete

## 2023-12-09 ENCOUNTER — Other Ambulatory Visit: Payer: Self-pay | Admitting: Family

## 2023-12-09 DIAGNOSIS — E1165 Type 2 diabetes mellitus with hyperglycemia: Secondary | ICD-10-CM

## 2023-12-11 ENCOUNTER — Other Ambulatory Visit: Payer: Self-pay

## 2023-12-11 NOTE — Telephone Encounter (Signed)
 Complete

## 2023-12-25 ENCOUNTER — Telehealth

## 2023-12-25 DIAGNOSIS — K047 Periapical abscess without sinus: Secondary | ICD-10-CM

## 2023-12-26 MED ORDER — CLINDAMYCIN PALMITATE HCL 75 MG/5ML PO SOLR: 300.0000 mg | Freq: Three times a day (TID) | ORAL | 0 refills | Status: AC

## 2023-12-26 MED ORDER — NAPROXEN 500 MG PO TABS: 500.0000 mg | ORAL_TABLET | Freq: Two times a day (BID) | ORAL | 0 refills | Status: AC

## 2023-12-26 NOTE — Addendum Note (Signed)
 Addended by: GLADIS ELSIE BROCKS on: 12/26/2023 07:56 AM   Modules accepted: Orders

## 2023-12-26 NOTE — Progress Notes (Signed)
 E-Visit for Dental Pain  We are sorry that you are not feeling well.  Here is how we plan to help!  Based on what you have shared with me in the questionnaire, it sounds like you have a dental infection. I have prescribed Clindamycin  300mg  3 times a day for 7 days -- I have sent in a liquid medicine instead of a pill per your preference.  It is imperative that you see a dentist within 10 days of this eVisit to determine the cause of the dental pain and be sure it is adequately treated  A toothache or tooth pain is caused when the nerve in the root of a tooth or surrounding a tooth is irritated. Dental (tooth) infection, decay, injury, or loss of a tooth are the most common causes of dental pain. Pain may also occur after an extraction (tooth is pulled out). Pain sometimes originates from other areas and radiates to the jaw, thus appearing to be tooth pain.Bacteria growing inside your mouth can contribute to gum disease and dental decay, both of which can cause pain. A toothache occurs from inflammation of the central portion of the tooth called pulp. The pulp contains nerve endings that are very sensitive to pain. Inflammation to the pulp or pulpitis may be caused by dental cavities, trauma, and infection.    HOME CARE:   For toothaches: Over-the-counter pain medications such as acetaminophen  or ibuprofen  may be used. Take these as directed on the package while you arrange for a dental appointment. Avoid very cold or hot foods, because they may make the pain worse. You may get relief from biting on a cotton ball soaked in oil of cloves. You can get oil of cloves at most drug stores.  For jaw pain:  Aspirin may be helpful for problems in the joint of the jaw in adults. If pain happens every time you open your mouth widely, the temporomandibular joint (TMJ) may be the source of the pain. Yawning or taking a large bite of food may worsen the pain. An appointment with your doctor or dentist will help  you find the cause.     GET HELP RIGHT AWAY IF:  You have a high fever or chills If you have had a recent head or face injury and develop headache, light headedness, nausea, vomiting, or other symptoms that concern you after an injury to your face or mouth, you could have a more serious injury in addition to your dental injury. A facial rash associated with a toothache: This condition may improve with medication. Contact your doctor for them to decide what is appropriate. Any jaw pain occurring with chest pain: Although jaw pain is most commonly caused by dental disease, it is sometimes referred pain from other areas. People with heart disease, especially people who have had stents placed, people with diabetes, or those who have had heart surgery may have jaw pain as a symptom of heart attack or angina. If your jaw or tooth pain is associated with lightheadedness, sweating, or shortness of breath, you should see a doctor as soon as possible. Trouble swallowing or excessive pain or bleeding from gums: If you have a history of a weakened immune system, diabetes, or steroid use, you may be more susceptible to infections. Infections can often be more severe and extensive or caused by unusual organisms. Dental and gum infections in people with these conditions may require more aggressive treatment. An abscess may need draining or IV antibiotics, for example.  MAKE SURE  YOU   Understand these instructions. Will watch your condition. Will get help right away if you are not doing well or get worse.  Thank you for choosing an e-visit.  Your e-visit answers were reviewed by a board certified advanced clinical practitioner to complete your personal care plan. Depending upon the condition, your plan could have included both over the counter or prescription medications.  Please review your pharmacy choice. Make sure the pharmacy is open so you can pick up prescription now. If there is a problem, you may  contact your provider through [employer] and have the prescription routed to another pharmacy.  Your safety is important to us . If you have drug allergies check your prescription carefully.   For the next 24 hours you can use MyChart to ask questions about today's visit, request a non-urgent call back, or ask for a work or school excuse. You will get an email in the next two days asking about your experience. I hope that your e-visit has been valuable and will speed your recovery.  I have spent 5 minutes in review of e-visit questionnaire, review and updating patient chart, medical decision making and response to patient.   Elsie Velma Lunger, PA-C

## 2023-12-28 ENCOUNTER — Other Ambulatory Visit: Payer: Self-pay

## 2023-12-28 ENCOUNTER — Telehealth: Payer: Self-pay

## 2023-12-28 NOTE — Telephone Encounter (Signed)
 Pharmacy Patient Advocate Encounter  Received notification from [employer] that Prior Authorization for OZ[facility]PIC  has been APPROVED from 12/28/2023 to 12/27/2024   PA #/Case ID/Reference #: [id number]

## 2024-02-14 ENCOUNTER — Ambulatory Visit: Admission: EM | Admit: 2024-02-14 | Discharge: 2024-02-14 | Disposition: A | Discharge status: Home / Self Care

## 2024-02-14 ENCOUNTER — Ambulatory Visit (INDEPENDENT_AMBULATORY_CARE_PROVIDER_SITE_OTHER)

## 2024-02-14 ENCOUNTER — Encounter: Payer: Self-pay | Admitting: *Deleted

## 2024-02-14 DIAGNOSIS — M25561 Pain in right knee: Secondary | ICD-10-CM

## 2024-02-14 DIAGNOSIS — S86911A Strain of unspecified muscle(s) and tendon(s) at lower leg level, right leg, initial encounter: Secondary | ICD-10-CM

## 2024-02-14 MED ORDER — DICLOFENAC SODIUM 50 MG PO TBEC: 50.0000 mg | DELAYED_RELEASE_TABLET | Freq: Two times a day (BID) | ORAL | 1 refills | Status: AC

## 2024-02-14 NOTE — ED Provider Notes (Signed)
 " [facility]  Note:  This document was prepared using Dragon voice recognition software and may include unintentional dictation errors.  MRN: [id number] DOB: Mar 22, 1975  Subjective:   April Byrd is a 49 y.o. female presenting for right leg pain since November.  Patient reports over the last 4 days she has had increased tightness and pain to right knee when getting up and down and bending.  Patient has been taking ibuprofen  with minimal improvement.  Patient states that pain in the knee keeps her up at night.  Patient denies any known injury or trauma.  Has scheduled follow-up with orthopedics next month.  Current Medications[1]   Allergies[2]  Past Medical History:  Diagnosis Date   Asthma    sports induced   BMI 37.0-37.9,adult 04/26/2012   Calculus of gallbladder with acute cholecystitis 04/26/2012   Calculus of gallbladder with acute cholecystitis, without mention of obstruction 04/26/2012   Type II or unspecified type diabetes mellitus without mention of complication, not stated as uncontrolled 04/26/2012   Unspecified asthma(493.90) 04/26/2012   Unspecified essential hypertension 04/26/2012     Past Surgical History:  Procedure Laterality Date   CESAREAN SECTION     x 3   CHOLECYSTECTOMY N/A 04/25/2012   Procedure: LAPAROSCOPIC CHOLECYSTECTOMY WITH INTRAOPERATIVE CHOLANGIOGRAM;  Surgeon: Morene ONEIDA Olives, MD;  Location: [facility];  Service: General;  Laterality: N/A;   hydrocephalus surgery     I & D EXTREMITY Right 01/06/2021   Procedure: IRRIGATION AND DEBRIDEMENT OF INDEX FINGER;  Surgeon: Shari Easter, MD;  Location: [facility];  Service: Orthopedics;  Laterality: Right;   TUBAL LIGATION      Family History  Problem Relation Age of Onset   Diabetes Mother    Hypertension Mother    Kidney disease Father    Healthy Brother    Healthy Son    Healthy Son    Healthy Son     Social History[3]  ROS Refer to HPI for ROS details.  Objective:   Vitals: BP 125/84 (BP  Location: Left Arm)   Pulse 92   Temp 98.4 F (36.9 C) (Oral)   Resp 18   LMP 01/03/2024 (Approximate)   SpO2 100%   Physical Exam Vitals and nursing note reviewed.  Constitutional:      General: She is not in acute distress.    Appearance: Normal appearance. She is well-developed. She is not ill-appearing or toxic-appearing.  HENT:     Head: Normocephalic and atraumatic.  Cardiovascular:     Rate and Rhythm: Normal rate.  Pulmonary:     Effort: Pulmonary effort is normal. No respiratory distress.  Musculoskeletal:     Right knee: Swelling and bony tenderness present. No deformity. Decreased range of motion. Tenderness present. Normal pulse.  Skin:    General: Skin is warm and dry.  Neurological:     General: No focal deficit present.     Mental Status: She is alert and oriented to person, place, and time.  Psychiatric:        Mood and Affect: Mood normal.        Behavior: Behavior normal.     Procedures  No results found for this or any previous visit (from the past 24 hours).  DG Knee Complete 4 Views Right Result Date: 02/14/2024 CLINICAL DATA:  Right knee pain for several months. EXAM: RIGHT KNEE - COMPLETE 4+ VIEW COMPARISON:  February 05, 2018 FINDINGS: No evidence of fracture, dislocation, or joint effusion. No evidence of arthropathy or other  focal bone abnormality. Soft tissues are unremarkable. IMPRESSION: Negative. Electronically Signed   By: Lynwood Landy Raddle M.D.   On: 02/14/2024 14:05     Assessment and Plan :     Discharge Instructions       1. Knee strain, right, initial encounter (Primary) - DG Knee Complete 4 Views Right x-ray performed in UC shows no acute fracture or dislocation, no joint effusion. - diclofenac  (VOLTAREN ) 50 MG EC tablet; Take 1 tablet (50 mg total) by mouth 2 (two) times daily.  Dispense: 30 tablet; Refill: 1 -Apply heat to joint for any activity, apply ice after activity for recovery, elevate leg when resting to help with any  swelling or inflammation. - AMB referral to orthopedics for follow-up evaluation and management if symptoms do not improve with current course of therapy.  -Continue to monitor symptoms for any change in severity if there is any escalation of current symptoms or development of new symptoms follow-up in ER for further evaluation and management.        Chey Cho B [name]    [1] No current facility-administered medications for this encounter.  Current Outpatient Medications:    albuterol  (VENTOLIN  HFA) 108 (90 Base) MCG/ACT inhaler, Inhale 2 puffs into the lungs every 6 (six) hours as needed for wheezing or shortness of breath., Disp: 8 g, Rfl: 1   diclofenac  (VOLTAREN ) 50 MG EC tablet, Take 1 tablet (50 mg total) by mouth 2 (two) times daily., Disp: 30 tablet, Rfl: 1   gabapentin  (NEURONTIN ) 300 MG capsule, Take 1 capsule by mouth at bedtime, Disp: 90 capsule, Rfl: 2   metFORMIN  (GLUCOPHAGE ) 500 MG tablet, TAKE 2 TABLETS BY MOUTH ONCE DAILY WITH BREAKFAST, Disp: 180 tablet, Rfl: 0   Semaglutide , 1 MG/DOSE, 2 MG/1.5ML SOPN, Inject 2 mg into the skin once a week., Disp: 21 mL, Rfl: 0   traZODone  (DESYREL ) 50 MG tablet, Take 0.5-1 tablets (25-50 mg total) by mouth at bedtime as needed for sleep., Disp: 90 tablet, Rfl: 0   Accu-Chek FastClix Lancets MISC, USE AS DIRECTED ONCE DAILY TO  TEST  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 (Patient not taking: Reported on 02/14/2024), Disp: 90 tablet, Rfl: 0   naproxen  (NAPROSYN ) 500 MG tablet, Take 1 tablet (500 mg total) by mouth 2 (two) times daily with a meal. (Patient not taking: Reported on 02/14/2024), Disp: 20 tablet, Rfl: 0   OZEMPIC , 1 MG/DOSE, 4 MG/3ML SOPN, INJECT 1 MG INTO THE SKIN ONCE A WEEK (Patient not taking: Reported on 02/14/2024), Disp: 9 mL, Rfl: 0   predniSONE  (DELTASONE ) 10 MG tablet, Take by mouth. (Patient  not taking: Reported on 02/14/2024), Disp: , Rfl:    predniSONE  (STERAPRED UNI-PAK 21 TAB) 10 MG (21) TBPK tablet, Take by mouth daily. Take 6 tabs by mouth daily  for 2 days, then 5 tabs for 2 days, then 4 tabs for 2 days, then 3 tabs for 2 days, 2 tabs for 2 days, then 1 tab by mouth daily for 2 days (Patient not taking: Reported on 02/14/2024), Disp: 42 tablet, Rfl: 0 [2]  Allergies Allergen Reactions   Penicillins Other (See Comments) and Rash    Has patient had a PCN reaction causing immediate rash, facial/tongue/throat swelling, SOB or lightheadedness with hypotension: Unknown Has patient had a PCN reaction causing severe rash involving mucus membranes or skin necrosis:  Unknown Has patient had a PCN reaction that required hospitalization: Unknown Has patient had a PCN reaction occurring within the last 10 years: No If all of the above answers are NO, then may proceed with Cephalosporin use.  Has patient had a PCN reaction causing immediate rash, facial/tongue/throat swelling, SOB or lightheadedness with hypotension: Unknown Has patient had a PCN reaction causing severe rash involving mucus membranes or skin necrosis: Unknown Has patient had a PCN reaction that required hospitalization: Unknown Has patient had a PCN reaction occurring within the last 10 years: No If all of the above answers are NO, then may proceed with Cephalosporin use.  [3]  Social History Tobacco Use   Smoking status: Never    Passive exposure: Never   Smokeless tobacco: Never  Vaping Use   Vaping status: Never Used  Substance Use Topics   Alcohol use: Yes    Comment: social    Drug use: No     Aurea Goodell B, NP 02/14/24 1451  "

## 2024-02-14 NOTE — ED Triage Notes (Signed)
 Pt reports right leg pain since November, worse over the last 4 days. States pain with getting up and down and bending. She has tried ibuprofen  with no relief- last dose yesterday. States pain keeps her up at night. Denies fall, denies injury. She has an appt with ortho next month.

## 2024-02-14 NOTE — Discharge Instructions (Signed)
" °  1. Knee strain, right, initial encounter (Primary) - DG Knee Complete 4 Views Right x-ray performed in UC shows no acute fracture or dislocation, no joint effusion. - diclofenac  (VOLTAREN ) 50 MG EC tablet; Take 1 tablet (50 mg total) by mouth 2 (two) times daily.  Dispense: 30 tablet; Refill: 1 -Apply heat to joint for any activity, apply ice after activity for recovery, elevate leg when resting to help with any swelling or inflammation. - AMB referral to orthopedics for follow-up evaluation and management if symptoms do not improve with current course of therapy.  -Continue to monitor symptoms for any change in severity if there is any escalation of current symptoms or development of new symptoms follow-up in ER for further evaluation and management.  "

## 2024-02-15 ENCOUNTER — Telehealth: Admitting: Physician Assistant

## 2024-02-15 DIAGNOSIS — K0889 Other specified disorders of teeth and supporting structures: Secondary | ICD-10-CM

## 2024-02-15 NOTE — Progress Notes (Signed)
 You have been treated for this complaint last month.   Do to frequent dental pain and/possible infection. I feel your condition warrants further evaluation and I recommend that you be seen in a face-to-face visit.   NOTE: There will be NO CHARGE for this E-Visit   If you are having a true medical emergency, please call 911.     For an urgent face to face visit, [facility] has multiple urgent care centers for your convenience.  Click the link below for the full list of locations and hours, walk-in wait times, appointment scheduling options and driving directions:  Urgent Care - [place], [place], [place], [place], [place], [place]  [place]     Your MyChart E-visit questionnaire answers were reviewed by a board certified advanced clinical practitioner to complete your personal care plan based on your specific symptoms.    Thank you for using e-Visits.
